# Patient Record
Sex: Female | Born: 1955
Health system: Southern US, Community
[De-identification: ages and names within clinical notes are randomized; demographics above are authoritative.]

## PROBLEM LIST (undated history)

## (undated) DIAGNOSIS — F32A Depression, unspecified: Secondary | ICD-10-CM

## (undated) DIAGNOSIS — E119 Type 2 diabetes mellitus without complications: Secondary | ICD-10-CM

## (undated) DIAGNOSIS — B019 Varicella without complication: Secondary | ICD-10-CM

## (undated) DIAGNOSIS — K219 Gastro-esophageal reflux disease without esophagitis: Secondary | ICD-10-CM

## (undated) DIAGNOSIS — F329 Major depressive disorder, single episode, unspecified: Secondary | ICD-10-CM

## (undated) DIAGNOSIS — H269 Unspecified cataract: Secondary | ICD-10-CM

## (undated) DIAGNOSIS — E785 Hyperlipidemia, unspecified: Secondary | ICD-10-CM

## (undated) DIAGNOSIS — R55 Syncope and collapse: Secondary | ICD-10-CM

## (undated) DIAGNOSIS — R000 Tachycardia, unspecified: Secondary | ICD-10-CM

## (undated) DIAGNOSIS — I2510 Atherosclerotic heart disease of native coronary artery without angina pectoris: Secondary | ICD-10-CM

## (undated) DIAGNOSIS — D649 Anemia, unspecified: Secondary | ICD-10-CM

## (undated) DIAGNOSIS — T7840XA Allergy, unspecified, initial encounter: Secondary | ICD-10-CM

## (undated) HISTORY — DX: Varicella without complication: B01.9

## (undated) HISTORY — PX: TONSILLECTOMY: SUR1361

## (undated) HISTORY — DX: Depression, unspecified: F32.A

## (undated) HISTORY — DX: Gastro-esophageal reflux disease without esophagitis: K21.9

## (undated) HISTORY — DX: Anemia, unspecified: D64.9

## (undated) HISTORY — PX: ANAL FISSURE REPAIR: SHX2312

## (undated) HISTORY — DX: Syncope and collapse: R55

## (undated) HISTORY — PX: EYE SURGERY: SHX253

## (undated) HISTORY — DX: Atherosclerotic heart disease of native coronary artery without angina pectoris: I25.10

## (undated) HISTORY — DX: Unspecified cataract: H26.9

## (undated) HISTORY — PX: TUBAL LIGATION: SHX77

## (undated) HISTORY — DX: Allergy, unspecified, initial encounter: T78.40XA

## (undated) HISTORY — DX: Tachycardia, unspecified: R00.0

## (undated) HISTORY — DX: Type 2 diabetes mellitus without complications: E11.9

## (undated) HISTORY — DX: Hyperlipidemia, unspecified: E78.5

---
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
## 2001-02-18 ENCOUNTER — Encounter: Admission: RE | Admit: 2001-02-18 | Discharge: 2001-02-18 | Payer: Self-pay | Admitting: Obstetrics and Gynecology

## 2001-02-18 ENCOUNTER — Encounter: Payer: Self-pay | Admitting: Obstetrics and Gynecology

## 2019-09-03 ENCOUNTER — Encounter: Payer: Self-pay | Admitting: Primary Care

## 2019-09-03 ENCOUNTER — Other Ambulatory Visit: Payer: Self-pay

## 2019-09-03 ENCOUNTER — Ambulatory Visit (INDEPENDENT_AMBULATORY_CARE_PROVIDER_SITE_OTHER): Payer: PPO | Admitting: Primary Care

## 2019-09-03 ENCOUNTER — Other Ambulatory Visit: Payer: Self-pay | Admitting: Primary Care

## 2019-09-03 VITALS — BP 124/82 | HR 103 | Temp 97.3°F | Ht 67.25 in | Wt 199.8 lb

## 2019-09-03 DIAGNOSIS — Z1211 Encounter for screening for malignant neoplasm of colon: Secondary | ICD-10-CM

## 2019-09-03 DIAGNOSIS — E119 Type 2 diabetes mellitus without complications: Secondary | ICD-10-CM | POA: Insufficient documentation

## 2019-09-03 DIAGNOSIS — R202 Paresthesia of skin: Secondary | ICD-10-CM | POA: Insufficient documentation

## 2019-09-03 DIAGNOSIS — Z23 Encounter for immunization: Secondary | ICD-10-CM

## 2019-09-03 DIAGNOSIS — Z1231 Encounter for screening mammogram for malignant neoplasm of breast: Secondary | ICD-10-CM

## 2019-09-03 DIAGNOSIS — E2839 Other primary ovarian failure: Secondary | ICD-10-CM | POA: Diagnosis not present

## 2019-09-03 DIAGNOSIS — D649 Anemia, unspecified: Secondary | ICD-10-CM | POA: Diagnosis not present

## 2019-09-03 DIAGNOSIS — E785 Hyperlipidemia, unspecified: Secondary | ICD-10-CM | POA: Insufficient documentation

## 2019-09-03 DIAGNOSIS — E1140 Type 2 diabetes mellitus with diabetic neuropathy, unspecified: Secondary | ICD-10-CM

## 2019-09-03 DIAGNOSIS — Z0000 Encounter for general adult medical examination without abnormal findings: Secondary | ICD-10-CM | POA: Insufficient documentation

## 2019-09-03 LAB — CBC
HCT: 42.4 % (ref 36.0–46.0)
Hemoglobin: 14.5 g/dL (ref 12.0–15.0)
MCHC: 34.3 g/dL (ref 30.0–36.0)
MCV: 89.2 fl (ref 78.0–100.0)
Platelets: 321 10*3/uL (ref 150.0–400.0)
RBC: 4.76 Mil/uL (ref 3.87–5.11)
RDW: 12.8 % (ref 11.5–15.5)
WBC: 9.5 10*3/uL (ref 4.0–10.5)

## 2019-09-03 LAB — LIPID PANEL
Cholesterol: 209 mg/dL — ABNORMAL HIGH (ref 0–200)
HDL: 35.9 mg/dL — ABNORMAL LOW (ref >39.00)
NonHDL: 172.8
Total CHOL/HDL Ratio: 6
Triglycerides: 292 mg/dL — ABNORMAL HIGH (ref 0.0–149.0)
VLDL: 58.4 mg/dL — ABNORMAL HIGH (ref 0.0–40.0)

## 2019-09-03 LAB — COMPREHENSIVE METABOLIC PANEL
ALT: 9 U/L (ref 0–35)
AST: 9 U/L (ref 0–37)
Albumin: 3.9 g/dL (ref 3.5–5.2)
Alkaline Phosphatase: 90 U/L (ref 39–117)
BUN: 12 mg/dL (ref 6–23)
CO2: 30 mEq/L (ref 19–32)
Calcium: 9.7 mg/dL (ref 8.4–10.5)
Chloride: 96 mEq/L (ref 96–112)
Creatinine, Ser: 0.45 mg/dL (ref 0.40–1.20)
GFR: 139.84 mL/min (ref >60.00)
Glucose, Bld: 396 mg/dL — ABNORMAL HIGH (ref 70–99)
Potassium: 4.1 mEq/L (ref 3.5–5.1)
Sodium: 134 mEq/L — ABNORMAL LOW (ref 135–145)
Total Bilirubin: 0.4 mg/dL (ref 0.2–1.2)
Total Protein: 6.7 g/dL (ref 6.0–8.3)

## 2019-09-03 LAB — IBC + FERRITIN
Ferritin: 56.1 ng/mL (ref 10.0–291.0)
Iron: 93 ug/dL (ref 42–145)
Saturation Ratios: 29.3 % (ref 20.0–50.0)
Transferrin: 227 mg/dL (ref 212.0–360.0)

## 2019-09-03 LAB — VITAMIN B12: Vitamin B-12: 447 pg/mL (ref 211–911)

## 2019-09-03 LAB — TSH: TSH: 2.99 u[IU]/mL (ref 0.35–4.50)

## 2019-09-03 LAB — HEMOGLOBIN A1C: Hgb A1c MFr Bld: 13.3 % — ABNORMAL HIGH (ref 4.6–6.5)

## 2019-09-03 LAB — LDL CHOLESTEROL, DIRECT: Direct LDL: 137 mg/dL

## 2019-09-03 MED ORDER — BLOOD GLUCOSE MONITOR KIT: PACK | 0 refills | Status: DC

## 2019-09-03 MED ORDER — ATORVASTATIN CALCIUM 40 MG PO TABS: 40.0000 mg | ORAL_TABLET | Freq: Every evening | ORAL | 3 refills | Status: DC

## 2019-09-03 MED ORDER — ZOSTER VAC RECOMB ADJUVANTED 50 MCG/0.5ML IM SUSR: 0.5000 mL | Freq: Once | INTRAMUSCULAR | 1 refills | Status: AC

## 2019-09-03 MED ORDER — PEN NEEDLES 31G X 6 MM MISC: 2 refills | Status: DC

## 2019-09-03 MED ORDER — LANTUS SOLOSTAR 100 UNIT/ML ~~LOC~~ SOPN: 10.0000 [IU] | PEN_INJECTOR | Freq: Every evening | SUBCUTANEOUS | 2 refills | Status: DC

## 2019-09-03 MED ORDER — METFORMIN HCL 1000 MG PO TABS: 1000.0000 mg | ORAL_TABLET | Freq: Two times a day (BID) | ORAL | 3 refills | Status: DC

## 2019-09-03 MED ORDER — PNEUMOCOCCAL 13-VAL CONJ VACC IM SUSP: 0.5000 mL | Freq: Once | INTRAMUSCULAR | 0 refills | Status: AC

## 2019-09-03 NOTE — Patient Instructions (Addendum)
Call the breast center to schedule your mammogram and bone density scan.  You will be contacted regarding your referral to GI for the colonoscopy.  Please let us know if you have not been contacted within two weeks.   Stop by the lab prior to leaving today. I will notify you of your results once received.   Take the Shingles and pneumonia vaccinations to your pharmacy. Wait a few weeks in between Shingrix and Pneumonia vaccines. The second Shingrix is completed 2-6 months after the initial dose.  It was a pleasure to meet you today! Please don't hesitate to call or message me with any questions. Welcome to [facility]!

## 2019-09-03 NOTE — Assessment & Plan Note (Signed)
Overdue for health maintenance.  Rx's provided for Shingrix and Prevnar.  Order placed for mammogram, bone density. Referral placed to GI for colonoscopy.

## 2019-09-03 NOTE — Assessment & Plan Note (Signed)
Chronic since childhood. Repeat CBC, IBC panel, and B12 pending.

## 2019-09-03 NOTE — Assessment & Plan Note (Signed)
A1C today of 13.3, history of gestational diabetes, no diagnosis since.  I spoke with patient via phone and notified her of this diagnosis.   Treat with Lantus 10 units HS and Metformin 1000 mg BID. Discussed to start with Metformin once daily x 1-2 weeks due to potential GI side effects.  We will initiate statin therapy. Check urine microalbumin next visit. May need ACE/ARB.  We discussed the importance and directions for checking glucose readings. We discussed her goal glucose reading should be 120-130, below 150. Rx for glucometer sent to pharmacy.   She had no questions, we will plan to see her back in 4-6 weeks with glucose logs. She will call to schedule an appointment. I encouraged her to sign up on My Chart so we could communicate easier.

## 2019-09-03 NOTE — Progress Notes (Signed)
Subjective:    Patient ID: Alyssa Stone, female    DOB: March 21, 1956, 64 y.o.   MRN: [id number]  HPI  This visit occurred during the SARS-CoV-2 public health emergency.  Safety protocols were in place, including screening questions prior to the visit, additional usage of staff PPE, and extensive cleaning of exam room while observing appropriate contact time as indicated for disinfecting solutions.   Alyssa Stone is a 64 year old female who presents today to establish care and discuss the problems mentioned below. Will obtain/review records. She is also needing her mammogram, colonoscopy, Shingrix vaccines and Pneumonia vaccine.  1) Foot Discomfort: Chronic for the last year. Symptoms include compression with intermittent burning sensation with numbness to the dorsal and plantar foot from the arch to toes. Symptoms are bilateral but more prominent on the right.   Symptoms only occur during the night. She denies injury/trauma, daytime symptoms, color changes to the skin, personal history of diabetes, discomfort when walking. History of Anemia and was once on B12 supplementation.   BP Readings from Last 3 Encounters:  09/03/19 124/82   2) Anemia: Chronic since birth. She was managed on oral iron in the past, no recent use due to GI upset. She was also taking B12 at the time, no recent use. She's never had to undergo iron infusions or blood transfusions.   Review of Systems  Constitutional: Negative for fatigue.  Eyes: Negative for visual disturbance.  Respiratory: Negative for shortness of breath.   Cardiovascular: Negative for chest pain.  Gastrointestinal: Negative for blood in stool.  Genitourinary: Negative for vaginal bleeding.  Musculoskeletal:       See HPI  Skin: Negative for color change.  Allergic/Immunologic: Negative for environmental allergies.  Neurological: Positive for numbness.       See HPI  Hematological: Does not bruise/bleed easily.       Past Medical  History:  Diagnosis Date  . Allergy   . Chicken pox   . Depression   . Fainting episodes   . GERD (gastroesophageal reflux disease)      Social History   Socioeconomic History  . Marital status: Divorced    Spouse name: Not on file  . Number of children: 3  . Years of education: Not on file  . Highest education level: 12th grade  Occupational History  . Not on file  Tobacco Use  . Smoking status: Unknown If Ever Smoked  . Smokeless tobacco: Never Used  Substance and Sexual Activity  . Alcohol use: Not Currently  . Drug use: Never  . Sexual activity: Not Currently  Other Topics Concern  . Not on file  Social History Narrative  . Not on file   Social Determinants of Health   Financial Resource Strain:   . Difficulty of Paying Living Expenses: Not on file  Food Insecurity:   . Worried About Charity fundraiser in the Last Year: Not on file  . Ran Out of Food in the Last Year: Not on file  Transportation Needs:   . Lack of Transportation (Medical): Not on file  . Lack of Transportation (Non-Medical): Not on file  Physical Activity:   . Days of Exercise per Week: Not on file  . Minutes of Exercise per Session: Not on file  Stress:   . Feeling of Stress : Not on file  Social Connections:   . Frequency of Communication with Friends and Family: Not on file  . Frequency of Social Gatherings with Friends and  Family: Not on file  . Attends Religious Services: Not on file  . Active Member of Clubs or Organizations: Not on file  . Attends Archivist Meetings: Not on file  . Marital Status: Not on file  Intimate Partner Violence:   . Fear of Current or Ex-Partner: Not on file  . Emotionally Abused: Not on file  . Physically Abused: Not on file  . Sexually Abused: Not on file    Family History  Problem Relation Age of Onset  . Cancer Mother   . Diabetes Mother   . Miscarriages / [place] Mother   . Hypertension Father   . Heart attack Father   . Cancer  Sister   . Diabetes Sister   . Diabetes Brother   . Heart attack Brother   . Kidney disease Brother   . Hyperlipidemia Brother     Not on File  No current outpatient medications on file prior to visit.   No current facility-administered medications on file prior to visit.    BP 124/82   Pulse (!) 103   Temp (!) 97.3 F (36.3 C)   Ht 5' 7.25" (1.708 m)   Wt 199 lb 12.8 oz (90.6 kg)   SpO2 96%   BMI 31.06 kg/m    Objective:   Physical Exam  Constitutional: She is oriented to person, place, and time. She appears well-nourished.  Cardiovascular: Normal rate and regular rhythm.  Pulses:      Dorsalis pedis pulses are 2+ on the right side and 2+ on the left side.       Posterior tibial pulses are 2+ on the right side and 2+ on the left side.  Bilateral toes seem cooler  Respiratory: Effort normal and breath sounds normal.  Musculoskeletal:        General: Normal range of motion.     Cervical back: Neck supple.  Neurological: She is alert and oriented to person, place, and time.  Skin: Skin is warm and dry. No erythema.  Psychiatric: She has a normal mood and affect.           Assessment & Plan:

## 2019-09-03 NOTE — Assessment & Plan Note (Signed)
LDL above goal, especially in the setting of diabetes. She agreed. Rx for atorvastatin sent to pharmacy. Repeat lipids and LFT's in 4-6 weeks.

## 2019-09-03 NOTE — Assessment & Plan Note (Signed)
Plantar, bilateral, only occurring at night during rest.   Exam today overall benign. No swelling, erythema, deformity. Good circulation.  Check CBC, B12, CMP, A1C, TSH today.

## 2019-09-22 ENCOUNTER — Other Ambulatory Visit: Payer: Self-pay | Admitting: Primary Care

## 2019-10-08 ENCOUNTER — Ambulatory Visit: Payer: PPO | Admitting: Primary Care

## 2019-10-13 ENCOUNTER — Encounter: Payer: Self-pay | Admitting: Gastroenterology

## 2019-10-15 ENCOUNTER — Other Ambulatory Visit: Payer: Self-pay

## 2019-10-15 ENCOUNTER — Encounter: Payer: Self-pay | Admitting: Primary Care

## 2019-10-15 ENCOUNTER — Ambulatory Visit (INDEPENDENT_AMBULATORY_CARE_PROVIDER_SITE_OTHER): Payer: PPO | Admitting: Primary Care

## 2019-10-15 VITALS — BP 122/82 | HR 108 | Temp 97.2°F | Ht 67.25 in | Wt 193.8 lb

## 2019-10-15 DIAGNOSIS — E1140 Type 2 diabetes mellitus with diabetic neuropathy, unspecified: Secondary | ICD-10-CM | POA: Diagnosis not present

## 2019-10-15 DIAGNOSIS — E785 Hyperlipidemia, unspecified: Secondary | ICD-10-CM

## 2019-10-15 DIAGNOSIS — R202 Paresthesia of skin: Secondary | ICD-10-CM | POA: Diagnosis not present

## 2019-10-15 LAB — HEPATIC FUNCTION PANEL
ALT: 9 U/L (ref 0–35)
AST: 10 U/L (ref 0–37)
Albumin: 4.1 g/dL (ref 3.5–5.2)
Alkaline Phosphatase: 76 U/L (ref 39–117)
Bilirubin, Direct: 0.1 mg/dL (ref 0.0–0.3)
Total Bilirubin: 0.5 mg/dL (ref 0.2–1.2)
Total Protein: 7.5 g/dL (ref 6.0–8.3)

## 2019-10-15 LAB — LIPID PANEL
Cholesterol: 120 mg/dL (ref 0–200)
HDL: 32 mg/dL — ABNORMAL LOW (ref >39.00)
LDL Cholesterol: 56 mg/dL (ref 0–99)
NonHDL: 87.69
Total CHOL/HDL Ratio: 4
Triglycerides: 159 mg/dL — ABNORMAL HIGH (ref 0.0–149.0)
VLDL: 31.8 mg/dL (ref 0.0–40.0)

## 2019-10-15 LAB — MICROALBUMIN / CREATININE URINE RATIO
Creatinine,U: 110.3 mg/dL
Microalb Creat Ratio: 5.5 mg/g (ref 0.0–30.0)
Microalb, Ur: 6.1 mg/dL — ABNORMAL HIGH (ref 0.0–1.9)

## 2019-10-15 MED ORDER — METFORMIN HCL ER 500 MG PO TB24: 1000.0000 mg | ORAL_TABLET | Freq: Two times a day (BID) | ORAL | 3 refills | Status: DC

## 2019-10-15 MED ORDER — LANTUS SOLOSTAR 100 UNIT/ML ~~LOC~~ SOPN: 15.0000 [IU] | PEN_INJECTOR | Freq: Every evening | SUBCUTANEOUS | 2 refills | Status: DC

## 2019-10-15 NOTE — Patient Instructions (Signed)
We've increased your Lantus insulin to 15 units. Increase further to 20 units in two weeks if blood sugars run consistently at or above 200.  Stop by the lab prior to leaving today. I will notify you of your results once received.   Schedule the eye appointment as discussed.  Please schedule a follow up appointment in 3 months for diabetes check. Contact me sooner with any questions/concerns.  It was a pleasure to see you today!   Diabetes Mellitus and Nutrition, Adult When you have diabetes (diabetes mellitus), it is very important to have healthy eating habits because your blood sugar (glucose) levels are greatly affected by what you eat and drink. Eating healthy foods in the appropriate amounts, at about the same times every day, can help you:  Control your blood glucose.  Lower your risk of heart disease.  Improve your blood pressure.  Reach or maintain a healthy weight. Every person with diabetes is different, and each person has different needs for a meal plan. Your health care provider may recommend that you work with a diet and nutrition specialist (dietitian) to make a meal plan that is best for you. Your meal plan may vary depending on factors such as:  The calories you need.  The medicines you take.  Your weight.  Your blood glucose, blood pressure, and cholesterol levels.  Your activity level.  Other health conditions you have, such as heart or kidney disease. How do carbohydrates affect me? Carbohydrates, also called carbs, affect your blood glucose level more than any other type of food. Eating carbs naturally raises the amount of glucose in your blood. Carb counting is a method for keeping track of how many carbs you eat. Counting carbs is important to keep your blood glucose at a healthy level, especially if you use insulin or take certain oral diabetes medicines. It is important to know how many carbs you can safely have in each meal. This is different for every  person. Your dietitian can help you calculate how many carbs you should have at each meal and for each snack. Foods that contain carbs include:  Bread, cereal, rice, pasta, and crackers.  Potatoes and corn.  Peas, beans, and lentils.  Milk and yogurt.  Fruit and juice.  Desserts, such as cakes, cookies, ice cream, and candy. How does alcohol affect me? Alcohol can cause a sudden decrease in blood glucose (hypoglycemia), especially if you use insulin or take certain oral diabetes medicines. Hypoglycemia can be a life-threatening condition. Symptoms of hypoglycemia (sleepiness, dizziness, and confusion) are similar to symptoms of having too much alcohol. If your health care provider says that alcohol is safe for you, follow these guidelines:  Limit alcohol intake to no more than 1 drink per day for nonpregnant women and 2 drinks per day for men. One drink equals 12 oz of beer, 5 oz of wine, or 1 oz of hard liquor.  Do not drink on an empty stomach.  Keep yourself hydrated with water, diet soda, or unsweetened iced tea.  Keep in mind that regular soda, juice, and other mixers may contain a lot of sugar and must be counted as carbs. What are tips for following this plan?  Reading food labels  Start by checking the serving size on the "Nutrition Facts" label of packaged foods and drinks. The amount of calories, carbs, fats, and other nutrients listed on the label is based on one serving of the item. Many items contain more than one serving per package.  Check the total grams (g) of carbs in one serving. You can calculate the number of servings of carbs in one serving by dividing the total carbs by 15. For example, if a food has 30 g of total carbs, it would be equal to 2 servings of carbs.  Check the number of grams (g) of saturated and trans fats in one serving. Choose foods that have low or no amount of these fats.  Check the number of milligrams (mg) of salt (sodium) in one serving.  Most people should limit total sodium intake to less than 2,300 mg per day.  Always check the nutrition information of foods labeled as "low-fat" or "nonfat". These foods may be higher in added sugar or refined carbs and should be avoided.  Talk to your dietitian to identify your daily goals for nutrients listed on the label. Shopping  Avoid buying canned, premade, or processed foods. These foods tend to be high in fat, sodium, and added sugar.  Shop around the outside edge of the grocery store. This includes fresh fruits and vegetables, bulk grains, fresh meats, and fresh dairy. Cooking  Use low-heat cooking methods, such as baking, instead of high-heat cooking methods like deep frying.  Cook using healthy oils, such as olive, canola, or sunflower oil.  Avoid cooking with butter, cream, or high-fat meats. Meal planning  Eat meals and snacks regularly, preferably at the same times every day. Avoid going long periods of time without eating.  Eat foods high in fiber, such as fresh fruits, vegetables, beans, and whole grains. Talk to your dietitian about how many servings of carbs you can eat at each meal.  Eat 4-6 ounces (oz) of lean protein each day, such as lean meat, chicken, fish, eggs, or tofu. One oz of lean protein is equal to: ? 1 oz of meat, chicken, or fish. ? 1 egg. ?  cup of tofu.  Eat some foods each day that contain healthy fats, such as avocado, nuts, seeds, and fish. Lifestyle  Check your blood glucose regularly.  Exercise regularly as told by your health care provider. This may include: ? 150 minutes of moderate-intensity or vigorous-intensity exercise each week. This could be brisk walking, biking, or water aerobics. ? Stretching and doing strength exercises, such as yoga or weightlifting, at least 2 times a week.  Take medicines as told by your health care provider.  Do not use any products that contain nicotine or tobacco, such as cigarettes and e-cigarettes.  If you need help quitting, ask your health care provider.  Work with a Social worker or diabetes educator to identify strategies to manage stress and any emotional and social challenges. Questions to ask a health care provider  Do I need to meet with a diabetes educator?  Do I need to meet with a dietitian?  What number can I call if I have questions?  When are the best times to check my blood glucose? Where to find more information:  American Diabetes Association: diabetes.org  Academy of Nutrition and Dietetics: www.eatright.[employer] of Diabetes and Digestive and Kidney Diseases (NIH): [employer] Summary  A healthy meal plan will help you control your blood glucose and maintain a healthy lifestyle.  Working with a diet and nutrition specialist (dietitian) can help you make a meal plan that is best for you.  Keep in mind that carbohydrates (carbs) and alcohol have immediate effects on your blood glucose levels. It is important to count carbs and to use alcohol carefully. This  information is not intended to replace advice given to you by your health care provider. Make sure you discuss any questions you have with your health care provider. Document Revised: 07/19/2017 Document Reviewed: 09/10/2016 Elsevier Patient Education  2020 [employer].

## 2019-10-15 NOTE — Assessment & Plan Note (Signed)
Improving with diabetes treatment. Continue to monitor. Foot exam today.

## 2019-10-15 NOTE — Progress Notes (Signed)
 Subjective:    Patient ID: Alyssa Stone, female    DOB: 04/30/1956, 64 y.o.   MRN: [id number]  HPI  This visit occurred during the SARS-CoV-2 public health emergency.  Safety protocols were in place, including screening questions prior to the visit, additional usage of staff PPE, and extensive cleaning of exam room while observing appropriate contact time as indicated for disinfecting solutions.   Alyssa Stone is a 64 year old female who presents today for follow up.  1) Hyperlipidemia: Currently managed on atorvastatin 40 mg which was initiated during her last visit about six weeks ago. She denies myalgias. Due for repeat labs today.  2) Type 2 Diabetes: New diagnosis as of last month.  Current medications include: Lantus 10 units, Metformin 1000 mg BID. She has nausea each time she takes her metformin. Denies diarrhea.  She is checking her blood glucose 3 times daily and is getting readings of:  AM fasting: low to mid 200's Before lunch: mid 200's-low 300's Before dinner: high 100's to low 300's  Last A1C: 13.3 in January 2021 Last Eye Exam: She will schedule.  Last Foot Exam: Due today Pneumonia Vaccination: Completed  ACE/ARB: Urine microalbumin due today Statin: atorvastatin   BP Readings from Last 3 Encounters:  10/15/19 122/82  09/03/19 124/82     Review of Systems  Respiratory: Negative for shortness of breath.   Cardiovascular: Negative for chest pain.  Gastrointestinal: Positive for nausea. Negative for diarrhea.  Neurological: Negative for dizziness and headaches.       Past Medical History:  Diagnosis Date  . Allergy   . Anemia   . Chicken pox   . Depression   . Fainting episodes   . GERD (gastroesophageal reflux disease)      Social History   Socioeconomic History  . Marital status: Divorced    Spouse name: Not on file  . Number of children: 3  . Years of education: Not on file  . Highest education level: 12th grade  Occupational  History  . Not on file  Tobacco Use  . Smoking status: Unknown If Ever Smoked  . Smokeless tobacco: Never Used  Substance and Sexual Activity  . Alcohol use: Not Currently  . Drug use: Never  . Sexual activity: Not Currently  Other Topics Concern  . Not on file  Social History Narrative  . Not on file   Social Determinants of Health   Financial Resource Strain:   . Difficulty of Paying Living Expenses: Not on file  Food Insecurity:   . Worried About Running Out of Food in the Last Year: Not on file  . Ran Out of Food in the Last Year: Not on file  Transportation Needs:   . Lack of Transportation (Medical): Not on file  . Lack of Transportation (Non-Medical): Not on file  Physical Activity:   . Days of Exercise per Week: Not on file  . Minutes of Exercise per Session: Not on file  Stress:   . Feeling of Stress : Not on file  Social Connections:   . Frequency of Communication with Friends and Family: Not on file  . Frequency of Social Gatherings with Friends and Family: Not on file  . Attends Religious Services: Not on file  . Active Member of Clubs or Organizations: Not on file  . Attends Club or Organization Meetings: Not on file  . Marital Status: Not on file  Intimate Partner Violence:   . Fear of Current or Ex-Partner: Not   on file  . Emotionally Abused: Not on file  . Physically Abused: Not on file  . Sexually Abused: Not on file    Past Surgical History:  Procedure Laterality Date  . CESAREAN SECTION    . TONSILLECTOMY Bilateral     Family History  Problem Relation Age of Onset  . Cancer Mother   . Diabetes Mother   . Miscarriages / [place] Mother   . Hypertension Father   . Heart attack Father   . Cancer Sister   . Diabetes Sister   . Diabetes Brother   . Heart attack Brother   . Kidney disease Brother   . Hyperlipidemia Brother     No Known Allergies  Current Outpatient Medications on File Prior to Visit  Medication Sig Dispense Refill  .  atorvastatin (LIPITOR) 40 MG tablet Take 1 tablet (40 mg total) by mouth every evening. For cholesterol. 90 tablet 3  . blood glucose meter kit and supplies KIT Dispense based on patient and insurance preference. Use up to four times daily as directed. (FOR ICD-9 250.00, 250.01). 1 each 0  . Insulin Glargine (LANTUS SOLOSTAR) 100 UNIT/ML Solostar Pen Inject 10 Units into the skin every evening. For diabetes 5 pen 2  . Insulin Pen Needle (PEN NEEDLES) 31G X 6 MM MISC Use nightly with insulin pen for diabetes. 100 each 2  . metFORMIN (GLUCOPHAGE) 1000 MG tablet Take 1 tablet (1,000 mg total) by mouth 2 (two) times daily with a meal. For diabetes. 180 tablet 3  . OneTouch Delica Lancets 56L MISC USE TO TEST BLOOD SUGAR UP TO 4 TIMES A DAY 100 each 5  . ONETOUCH VERIO test strip USE TO TEST BLOOD SUGAR UP TO 4 TIMES DAILY 100 strip 5   No current facility-administered medications on file prior to visit.    BP 122/82   Pulse (!) 108   Temp (!) 97.2 F (36.2 C) (Temporal)   Ht 5' 7.25" (1.708 m)   Wt 193 lb 12 oz (87.9 kg)   SpO2 98%   BMI 30.12 kg/m    Objective:   Physical Exam  Constitutional: She appears well-nourished.  Cardiovascular: Normal rate and regular rhythm.  Respiratory: Effort normal and breath sounds normal.  Musculoskeletal:     Cervical back: Neck supple.  Skin: Skin is warm and dry.  Psychiatric: She has a normal mood and affect.           Assessment & Plan:

## 2019-10-15 NOTE — Assessment & Plan Note (Signed)
Glucose readings above goal on Lantus 10 units, Metformin causing nausea.   Change Metformin to XR version. Increase Lantus to 15 units for two weeks, then 20 units for glucose readings consistently at or above 200.  Foot exam today. Pneumonia vaccination UTD. Managed on statin. Urine microalbumin pending.  Follow up in 3-4 months.

## 2019-10-15 NOTE — Assessment & Plan Note (Signed)
Compliant to atorvastatin, repeat lipids and LFT's pending.

## 2019-11-12 ENCOUNTER — Other Ambulatory Visit: Payer: Self-pay

## 2019-11-12 ENCOUNTER — Ambulatory Visit (AMBULATORY_SURGERY_CENTER): Payer: Self-pay | Admitting: *Deleted

## 2019-11-12 VITALS — Temp 96.6°F | Ht 67.25 in | Wt 199.0 lb

## 2019-11-12 DIAGNOSIS — Z1211 Encounter for screening for malignant neoplasm of colon: Secondary | ICD-10-CM

## 2019-11-12 DIAGNOSIS — Z01818 Encounter for other preprocedural examination: Secondary | ICD-10-CM

## 2019-11-12 NOTE — Progress Notes (Signed)

## 2019-11-20 ENCOUNTER — Other Ambulatory Visit: Payer: Self-pay

## 2019-11-20 ENCOUNTER — Ambulatory Visit
Admission: RE | Admit: 2019-11-20 | Discharge: 2019-11-20 | Disposition: A | Discharge status: Home / Self Care | Payer: PPO | Source: Ambulatory Visit | Attending: Primary Care | Admitting: Primary Care

## 2019-11-20 DIAGNOSIS — Z1231 Encounter for screening mammogram for malignant neoplasm of breast: Secondary | ICD-10-CM

## 2019-11-20 DIAGNOSIS — Z780 Asymptomatic menopausal state: Secondary | ICD-10-CM | POA: Diagnosis not present

## 2019-11-20 DIAGNOSIS — E2839 Other primary ovarian failure: Secondary | ICD-10-CM

## 2019-11-23 ENCOUNTER — Other Ambulatory Visit: Payer: Self-pay | Admitting: Primary Care

## 2019-11-23 DIAGNOSIS — R928 Other abnormal and inconclusive findings on diagnostic imaging of breast: Secondary | ICD-10-CM

## 2019-11-24 ENCOUNTER — Ambulatory Visit (INDEPENDENT_AMBULATORY_CARE_PROVIDER_SITE_OTHER): Payer: PPO

## 2019-11-24 ENCOUNTER — Other Ambulatory Visit: Payer: Self-pay | Admitting: Gastroenterology

## 2019-11-24 DIAGNOSIS — Z1159 Encounter for screening for other viral diseases: Secondary | ICD-10-CM | POA: Diagnosis not present

## 2019-11-25 ENCOUNTER — Encounter: Payer: Self-pay | Admitting: Gastroenterology

## 2019-11-25 LAB — SARS CORONAVIRUS 2 (TAT 6-24 HRS): SARS Coronavirus 2: NEGATIVE

## 2019-11-26 ENCOUNTER — Encounter: Payer: Self-pay | Admitting: Gastroenterology

## 2019-11-26 ENCOUNTER — Other Ambulatory Visit: Payer: Self-pay

## 2019-11-26 ENCOUNTER — Ambulatory Visit (AMBULATORY_SURGERY_CENTER): Payer: PPO | Admitting: Gastroenterology

## 2019-11-26 VITALS — BP 127/70 | HR 84 | Temp 98.2°F | Resp 16 | Ht 62.0 in | Wt 199.0 lb

## 2019-11-26 DIAGNOSIS — D123 Benign neoplasm of transverse colon: Secondary | ICD-10-CM

## 2019-11-26 DIAGNOSIS — D125 Benign neoplasm of sigmoid colon: Secondary | ICD-10-CM | POA: Diagnosis not present

## 2019-11-26 DIAGNOSIS — D122 Benign neoplasm of ascending colon: Secondary | ICD-10-CM | POA: Diagnosis not present

## 2019-11-26 DIAGNOSIS — Z1211 Encounter for screening for malignant neoplasm of colon: Secondary | ICD-10-CM

## 2019-11-26 DIAGNOSIS — D124 Benign neoplasm of descending colon: Secondary | ICD-10-CM

## 2019-11-26 MED ORDER — SODIUM CHLORIDE 0.9 % IV SOLN: 500.0000 mL | INTRAVENOUS | Status: DC

## 2019-11-26 NOTE — Patient Instructions (Signed)
YOU HAD AN ENDOSCOPIC PROCEDURE TODAY AT [facility]:   Refer to the procedure report that was given to you for any specific questions about what was found during the examination.  If the procedure report does not answer your questions, please call your gastroenterologist to clarify.  If you requested that your care partner not be given the details of your procedure findings, then the procedure report has been included in a sealed envelope for you to review at your convenience later.  YOU SHOULD EXPECT: Some feelings of bloating in the abdomen. Passage of more gas than usual.  Walking can help get rid of the air that was put into your GI tract during the procedure and reduce the bloating. If you had a lower endoscopy (such as a colonoscopy or flexible sigmoidoscopy) you may notice spotting of blood in your stool or on the toilet paper. If you underwent a bowel prep for your procedure, you may not have a normal bowel movement for a few days.  Please Note:  You might notice some irritation and congestion in your nose or some drainage.  This is from the oxygen used during your procedure.  There is no need for concern and it should clear up in a day or so.  SYMPTOMS TO REPORT IMMEDIATELY:   Following lower endoscopy (colonoscopy or flexible sigmoidoscopy):  Excessive amounts of blood in the stool  Significant tenderness or worsening of abdominal pains  Swelling of the abdomen that is new, acute  Fever of 100F or higher   For urgent or emergent issues, a gastroenterologist can be reached at any hour by calling [phone]. Do not use MyChart messaging for urgent concerns.    DIET:  We do recommend a small meal at first, but then you may proceed to your regular diet.  Drink plenty of fluids but you should avoid alcoholic beverages for 24 hours. Follow a High Fiber Diet.  MEDICATIONS: Continue present medications. Use FiberCon 1 tablet by mouth daily.  Please see handouts given  to you by your recovery nurse.  ACTIVITY:  You should plan to take it easy for the rest of today and you should NOT DRIVE or use heavy machinery until tomorrow (because of the sedation medicines used during the test).    FOLLOW UP: Our staff will call the number listed on your records 48-72 hours following your procedure to check on you and address any questions or concerns that you may have regarding the information given to you following your procedure. If we do not reach you, we will leave a message.  We will attempt to reach you two times.  During this call, we will ask if you have developed any symptoms of COVID 19. If you develop any symptoms (ie: fever, flu-like symptoms, shortness of breath, cough etc.) before then, please call [phone].  If you test positive for Covid 19 in the 2 weeks post procedure, please call and report this information to [place].    If any biopsies were taken you will be contacted by phone or by letter within the next 1-3 weeks.  Please call us at [phone] if you have not heard about the biopsies in 3 weeks.   Thank you for allowing [place] to provide for your healthcare needs today.   SIGNATURES/CONFIDENTIALITY: You and/or your care partner have signed paperwork which will be entered into your electronic medical record.  These signatures attest to the fact that that the information above on your After Visit  Summary has been reviewed and is understood.  Full responsibility of the confidentiality of this discharge information lies with you and/or your care-partner. 

## 2019-11-26 NOTE — Progress Notes (Signed)
pt tolerated well. VSS. awake and to recovery. Report given to RN.  

## 2019-11-26 NOTE — Op Note (Signed)
[place] Patient Name: Alyssa Stone Procedure Date: 11/26/2019 8:57 AM MRN: [id number] Endoscopist: Justice Britain , MD Age: 64 Referring MD:  Date of Birth: 12/26/1955 Gender: Female Account #: [id number] Procedure:                Colonoscopy Indications:              Screening for colorectal malignant neoplasm, This                            is the patient's first colonoscopy Medicines:                Monitored Anesthesia Care Procedure:                Pre-Anesthesia Assessment:                           - Prior to the procedure, a History and Physical                            was performed, and patient medications and                            allergies were reviewed. The patient's tolerance of                            previous anesthesia was also reviewed. The risks                            and benefits of the procedure and the sedation                            options and risks were discussed with the patient.                            All questions were answered, and informed consent                            was obtained. Prior Anticoagulants: The patient has                            taken no previous anticoagulant or antiplatelet                            agents. ASA Grade Assessment: II - A patient with                            mild systemic disease. After reviewing the risks                            and benefits, the patient was deemed in                            satisfactory condition to undergo the procedure.  After obtaining informed consent, the colonoscope                            was passed under direct vision. Throughout the                            procedure, the patient's blood pressure, pulse, and                            oxygen saturations were monitored continuously. The                            Colonoscope was introduced through the anus and                            advanced to the the  cecum, identified by                            appendiceal orifice and ileocecal valve. The                            colonoscopy was performed without difficulty. The                            patient tolerated the procedure. The quality of the                            bowel preparation was adequate. The ileocecal                            valve, appendiceal orifice, and rectum were                            photographed. Scope In: 9:09:18 AM Scope Out: 9:36:55 AM Scope Withdrawal Time: 0 hours 22 minutes 57 seconds  Total Procedure Duration: 0 hours 27 minutes 37 seconds  Findings:                 The digital rectal exam findings include external                            skin tags and hemorrhoids. Pertinent negatives                            include no palpable rectal lesions.                           A large amount of semi-liquid semi-solid stool was                            found in the entire colon, interfering with                            visualization. Lavage of the area was performed  using copious amounts, resulting in clearance with                            adequate visualization.                           Four sessile and semi-sessile polyps were found in                            the sigmoid colon (1), descending colon (1),                            transverse colon (1), and ascending colon (1). The                            polyps were 3 to 10 mm in size. These polyps were                            removed with a cold snare. Resection and retrieval                            were complete.                           Many small-mouthed diverticula were found in the                            recto-sigmoid colon, sigmoid colon and descending                            colon.                           Normal mucosa was found in the entire colon                            otherwise.                           Non-bleeding  non-thrombosed external and internal                            hemorrhoids were found during retroflexion, during                            perianal exam and during digital exam. The                            hemorrhoids were Grade II (internal hemorrhoids                            that prolapse but reduce spontaneously). Complications:            No immediate complications. Estimated Blood Loss:     Estimated blood loss was minimal. Impression:               -  Hemorrhoids found on digital rectal exam as well                            as external skin tags.                           - Stool in the entire examined colon. Lavaged                            copiously in order to obtain adequate preparation.                           - Four 3 to 10 mm polyps in the sigmoid colon, in                            the descending colon, in the transverse colon and                            in the ascending colon, removed with a cold snare.                            Resected and retrieved.                           - Diverticulosis in the recto-sigmoid colon, in the                            sigmoid colon and in the descending colon.                           - Normal mucosa in the entire examined colon                            otherwise.                           - Non-bleeding non-thrombosed external and internal                            hemorrhoids. Recommendation:           - The patient will be observed post-procedure,                            until all discharge criteria are met.                           - Discharge patient to home.                           - Patient has a contact number available for                            emergencies. The signs and symptoms of potential  delayed complications were discussed with the                            patient. Return to normal activities tomorrow.                            Written discharge instructions  were provided to the                            patient.                           - High fiber diet.                           - Use FiberCon 1 tablet PO daily.                           - Continue present medications.                           - Await pathology results.                           - Repeat colonoscopy in likely 3 years for                            surveillance. Recommend 1 week of Miralax 1-2 times                            daily prior to her preparation to ensure adequate                            visualization on next procedure.                           - The findings and recommendations were discussed                            with the patient. Justice Britain, MD 11/26/2019 9:43:55 AM

## 2019-11-26 NOTE — Progress Notes (Signed)
Temp JB  V/S CW  I have reviewed the patient's medical history in detail and updated the computerized patient record. 

## 2019-11-26 NOTE — Progress Notes (Signed)
Called to room to assist during endoscopic procedure.  Patient ID and intended procedure confirmed with present staff. Received instructions for my participation in the procedure from the performing physician.  

## 2019-11-30 ENCOUNTER — Telehealth: Payer: Self-pay | Admitting: *Deleted

## 2019-11-30 NOTE — Telephone Encounter (Signed)
  Follow up Call-  Call back number 11/26/2019  Post procedure Call Back phone  # [phone]  Permission to leave phone message Yes  Some recent data might be hidden     Patient questions:  Do you have a fever, pain , or abdominal swelling? No. Pain Score  0 *  Have you tolerated food without any problems? Yes.    Have you been able to return to your normal activities? Yes.    Do you have any questions about your discharge instructions: Diet   No. Medications  No. Follow up visit  No.  Do you have questions or concerns about your Care? No.  Actions: * If pain score is 4 or above: No action needed, pain <4.  1. Have you developed a fever since your procedure? no  2.   Have you had an respiratory symptoms (SOB or cough) since your procedure? no  3.   Have you tested positive for COVID 19 since your procedure no  4.   Have you had any family members/close contacts diagnosed with the COVID 19 since your procedure?  no   If yes to any of these questions please route to Joylene John, RN and Erenest Rasher, RN

## 2019-12-01 ENCOUNTER — Encounter: Payer: Self-pay | Admitting: Gastroenterology

## 2019-12-04 DIAGNOSIS — H0288A Meibomian gland dysfunction right eye, upper and lower eyelids: Secondary | ICD-10-CM | POA: Diagnosis not present

## 2019-12-04 DIAGNOSIS — H524 Presbyopia: Secondary | ICD-10-CM | POA: Diagnosis not present

## 2019-12-04 DIAGNOSIS — H04123 Dry eye syndrome of bilateral lacrimal glands: Secondary | ICD-10-CM | POA: Diagnosis not present

## 2019-12-04 DIAGNOSIS — E113293 Type 2 diabetes mellitus with mild nonproliferative diabetic retinopathy without macular edema, bilateral: Secondary | ICD-10-CM | POA: Diagnosis not present

## 2019-12-04 DIAGNOSIS — H0288B Meibomian gland dysfunction left eye, upper and lower eyelids: Secondary | ICD-10-CM | POA: Diagnosis not present

## 2019-12-04 LAB — HM DIABETES EYE EXAM

## 2019-12-07 ENCOUNTER — Other Ambulatory Visit: Payer: Self-pay

## 2019-12-07 ENCOUNTER — Ambulatory Visit
Admission: RE | Admit: 2019-12-07 | Discharge: 2019-12-07 | Disposition: A | Discharge status: Home / Self Care | Payer: PPO | Source: Ambulatory Visit | Attending: Primary Care | Admitting: Primary Care

## 2019-12-07 DIAGNOSIS — R928 Other abnormal and inconclusive findings on diagnostic imaging of breast: Secondary | ICD-10-CM

## 2019-12-07 DIAGNOSIS — N6489 Other specified disorders of breast: Secondary | ICD-10-CM | POA: Diagnosis not present

## 2019-12-09 ENCOUNTER — Encounter: Payer: Self-pay | Admitting: Primary Care

## 2020-02-04 ENCOUNTER — Other Ambulatory Visit: Payer: Self-pay

## 2020-02-04 ENCOUNTER — Encounter: Payer: Self-pay | Admitting: Primary Care

## 2020-02-04 ENCOUNTER — Ambulatory Visit (INDEPENDENT_AMBULATORY_CARE_PROVIDER_SITE_OTHER): Payer: PPO | Admitting: Primary Care

## 2020-02-04 VITALS — BP 110/72 | HR 108 | Temp 96.2°F | Ht 67.25 in | Wt 194.0 lb

## 2020-02-04 DIAGNOSIS — E785 Hyperlipidemia, unspecified: Secondary | ICD-10-CM | POA: Diagnosis not present

## 2020-02-04 DIAGNOSIS — E1140 Type 2 diabetes mellitus with diabetic neuropathy, unspecified: Secondary | ICD-10-CM | POA: Diagnosis not present

## 2020-02-04 LAB — POCT GLYCOSYLATED HEMOGLOBIN (HGB A1C): Hemoglobin A1C: 8 % — AB (ref 4.0–5.6)

## 2020-02-04 MED ORDER — METFORMIN HCL ER 500 MG PO TB24: 500.0000 mg | ORAL_TABLET | Freq: Two times a day (BID) | ORAL | 3 refills | Status: DC

## 2020-02-04 MED ORDER — LANTUS SOLOSTAR 100 UNIT/ML ~~LOC~~ SOPN: 25.0000 [IU] | PEN_INJECTOR | Freq: Every evening | SUBCUTANEOUS | 2 refills | Status: DC

## 2020-02-04 NOTE — Patient Instructions (Addendum)
We've reduced your dose of metformin to 1 tablet twice daily for now.  We've increased your dose of Lantus to 25 units.  Continue to monitor your blood sugars.  Please schedule a follow up appointment in 3 months for diabetes check.  It was a pleasure to see you today!

## 2020-02-04 NOTE — Assessment & Plan Note (Signed)
Improved on atorvastatin, continue same.

## 2020-02-04 NOTE — Progress Notes (Signed)
Subjective:    Patient ID: Alyssa Stone, female    DOB: [birth year], 65 y.o.   MRN: [id number]  HPI  This visit occurred during the SARS-CoV-2 public health emergency.  Safety protocols were in place, including screening questions prior to the visit, additional usage of staff PPE, and extensive cleaning of exam room while observing appropriate contact time as indicated for disinfecting solutions.   Alyssa Stone is a 64 year old female with a history of type 2 diabetes, hyperlipidemia, anemia who presents today for follow up of diabetes.  Current medications include: Metformin XR 500, 2 tablets BID, Lantus 15 units daily. She is actually injecting 20 units nightly. Metformin has caused her to "feel badly" in general, doesn't feel well on 2 tabs BID.   She is checking her glucose 2 times daily and is getting readings of   AM fasting: mid 100's Bedtime: mid 100's, some higher 100's  Last A1C: 13.3 in January 2021, 8.0 today Last Eye Exam: UTD Last Foot Exam: UTD Pneumonia Vaccination: Completed in 2021 ACE/ARB: None. Urine micro negative in  Statin: atorvastatin    BP Readings from Last 3 Encounters:  02/04/20 110/72  11/26/19 127/70  10/15/19 122/82       Review of Systems  Eyes: Negative for visual disturbance.  Respiratory: Negative for shortness of breath.   Cardiovascular: Negative for chest pain.  Neurological: Negative for dizziness and headaches.        Past Medical History:  Diagnosis Date  . Allergy   . Anemia   . Chicken pox   . Depression   . Diabetes mellitus without complication ([place])   . Fainting episodes   . GERD (gastroesophageal reflux disease)   . Hyperlipidemia    controlled om meds      Social History   Socioeconomic History  . Marital status: Divorced    Spouse name: Not on file  . Number of children: 3  . Years of education: Not on file  . Highest education level: 12th grade  Occupational History  . Not on file  Tobacco Use    . Smoking status: Former Research scientist (life sciences)  . Smokeless tobacco: Never Used  Substance and Sexual Activity  . Alcohol use: Not Currently  . Drug use: Never  . Sexual activity: Not Currently  Other Topics Concern  . Not on file  Social History Narrative  . Not on file   Social Determinants of Health   Financial Resource Strain:   . Difficulty of Paying Living Expenses:   Food Insecurity:   . Worried About Charity fundraiser in the Last Year:   . Arboriculturist in the Last Year:   Transportation Needs:   . Film/video editor (Medical):   Marland Kitchen Lack of Transportation (Non-Medical):   Physical Activity:   . Days of Exercise per Week:   . Minutes of Exercise per Session:   Stress:   . Feeling of Stress :   Social Connections:   . Frequency of Communication with Friends and Family:   . Frequency of Social Gatherings with Friends and Family:   . Attends Religious Services:   . Active Member of Clubs or Organizations:   . Attends Archivist Meetings:   Marland Kitchen Marital Status:   Intimate Partner Violence:   . Fear of Current or Ex-Partner:   . Emotionally Abused:   Marland Kitchen Physically Abused:   . Sexually Abused:     Past Surgical History:  Procedure Laterality Date  .  ANAL FISSURE REPAIR    . CESAREAN SECTION    . TONSILLECTOMY Bilateral     Family History  Problem Relation Age of Onset  . Cancer Mother   . Diabetes Mother   . Miscarriages / [place] Mother   . Hypertension Father   . Heart attack Father   . Cancer Sister   . Diabetes Sister   . Diabetes Brother   . Heart attack Brother   . Kidney disease Brother   . Hyperlipidemia Brother   . Colon cancer Neg Hx   . Colon polyps Neg Hx   . Esophageal cancer Neg Hx   . Rectal cancer Neg Hx   . Stomach cancer Neg Hx     No Known Allergies  Current Outpatient Medications on File Prior to Visit  Medication Sig Dispense Refill  . atorvastatin (LIPITOR) 40 MG tablet Take 1 tablet (40 mg total) by mouth every  evening. For cholesterol. 90 tablet 3  . blood glucose meter kit and supplies KIT Dispense based on patient and insurance preference. Use up to four times daily as directed. (FOR ICD-9 250.00, 250.01). 1 each 0  . Insulin Pen Needle (PEN NEEDLES) 31G X 6 MM MISC Use nightly with insulin pen for diabetes. 100 each 2  . OneTouch Delica Lancets 28F MISC USE TO TEST BLOOD SUGAR UP TO 4 TIMES A DAY 100 each 5  . ONETOUCH VERIO test strip USE TO TEST BLOOD SUGAR UP TO 4 TIMES DAILY 100 strip 5   Current Facility-Administered Medications on File Prior to Visit  Medication Dose Route Frequency Provider Last Rate Last Admin  . 0.9 %  sodium chloride infusion  500 mL Intravenous Continuous Mansouraty, Telford Nab., MD        BP 110/72   Pulse (!) 108   Temp (!) 96.2 F (35.7 C) (Temporal)   Ht 5' 7.25" (1.708 m)   Wt 194 lb (88 kg)   SpO2 98%   BMI 30.16 kg/m    Objective:   Physical Exam  Cardiovascular: Normal rate and regular rhythm.  Respiratory: Effort normal and breath sounds normal.  Musculoskeletal:     Cervical back: Neck supple.  Skin: Skin is warm and dry.           Assessment & Plan:

## 2020-02-04 NOTE — Assessment & Plan Note (Signed)
A1C today of 8.0 which is significant improvement from her last check in January 2021.  She would like to come off of insulin at some point, this may be possible but we would likely have to add in another oral agent.  A1c significantly improved, not at goal yet. Metformin is causing her to feel bad, reduce down to 500 mg twice daily. Increase Lantus to 25 units nightly for now, especially given since we are reducing Metformin and that her A1c is not at goal.  Urine microalbumin negative. Foot exam up-to-date. Pneumonia vaccination up-to-date. Eye exam up-to-date. Managed on statin therapy.  Follow-up in 3 months.

## 2020-02-18 DIAGNOSIS — E1140 Type 2 diabetes mellitus with diabetic neuropathy, unspecified: Secondary | ICD-10-CM

## 2020-02-19 MED ORDER — LANTUS SOLOSTAR 100 UNIT/ML ~~LOC~~ SOPN: 28.0000 [IU] | PEN_INJECTOR | Freq: Every evening | SUBCUTANEOUS | 2 refills | Status: DC

## 2020-03-04 DIAGNOSIS — H04123 Dry eye syndrome of bilateral lacrimal glands: Secondary | ICD-10-CM | POA: Diagnosis not present

## 2020-03-04 DIAGNOSIS — E113293 Type 2 diabetes mellitus with mild nonproliferative diabetic retinopathy without macular edema, bilateral: Secondary | ICD-10-CM | POA: Diagnosis not present

## 2020-05-05 ENCOUNTER — Ambulatory Visit: Payer: PPO | Admitting: Primary Care

## 2020-05-12 ENCOUNTER — Other Ambulatory Visit: Payer: Self-pay

## 2020-05-12 ENCOUNTER — Ambulatory Visit (INDEPENDENT_AMBULATORY_CARE_PROVIDER_SITE_OTHER): Payer: PPO | Admitting: Primary Care

## 2020-05-12 VITALS — BP 118/72 | HR 89 | Temp 97.6°F | Ht 67.0 in | Wt 204.0 lb

## 2020-05-12 DIAGNOSIS — E1140 Type 2 diabetes mellitus with diabetic neuropathy, unspecified: Secondary | ICD-10-CM | POA: Diagnosis not present

## 2020-05-12 LAB — POCT GLYCOSYLATED HEMOGLOBIN (HGB A1C): Hemoglobin A1C: 8.3 % — AB (ref 4.0–5.6)

## 2020-05-12 MED ORDER — GLIPIZIDE 5 MG PO TABS: 5.0000 mg | ORAL_TABLET | Freq: Two times a day (BID) | ORAL | 1 refills | Status: DC

## 2020-05-12 NOTE — Assessment & Plan Note (Signed)
A1C today of 8.3 which is slightly worse than last visit. Her diet overall has significantly improved, recommended exercise.  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  Continue Metformin and Lantus as is. Add Glipizide 5 mg BID.  We will plan to see her back in 3 months for follow up.

## 2020-05-12 NOTE — Progress Notes (Signed)
Subjective:    Patient ID: Alyssa Stone, female    DOB: 06-13-1956, 64 y.o.   MRN: [id number]  HPI  This visit occurred during the SARS-CoV-2 public health emergency.  Safety protocols were in place, including screening questions prior to the visit, additional usage of staff PPE, and extensive cleaning of exam room while observing appropriate contact time as indicated for disinfecting solutions.   Alyssa Stone is a 64 year old female with a history of type 2 diabetes, anemia, hyperlipidemia who presents today for follow up of diabetes.  Current medications include: Metformin XR 500 mg BID, Lantus 28 units HS.   She is checking her blood glucose 1 times daily and is getting readings of:  AM fasting: 120-170's, 150 on average  Last A1C: 8.0 in June 2021, 8.3 today Last Eye Exam: UTD Last Foot Exam: UTD Pneumonia Vaccination: Completed in 2021 ACE/ARB: None. Urine microalbumin UTD Statin: Lipitor  She is trying to work on her diet, has cut out a lot of sugar intake. Little bread and starch intake.   BP Readings from Last 3 Encounters:  05/12/20 118/72  02/04/20 110/72  11/26/19 127/70   Wt Readings from Last 3 Encounters:  05/12/20 204 lb (92.5 kg)  02/04/20 194 lb (88 kg)  11/26/19 199 lb (90.3 kg)     Review of Systems  Eyes: Negative for visual disturbance.  Respiratory: Negative for shortness of breath.   Cardiovascular: Negative for chest pain.  Neurological: Negative for dizziness, numbness and headaches.       Past Medical History:  Diagnosis Date  . Allergy   . Anemia   . Chicken pox   . Depression   . Diabetes mellitus without complication ([place])   . Fainting episodes   . GERD (gastroesophageal reflux disease)   . Hyperlipidemia    controlled om meds      Social History   Socioeconomic History  . Marital status: Divorced    Spouse name: Not on file  . Number of children: 3  . Years of education: Not on file  . Highest education level: 12th  grade  Occupational History  . Not on file  Tobacco Use  . Smoking status: Former Research scientist (life sciences)  . Smokeless tobacco: Never Used  Substance and Sexual Activity  . Alcohol use: Not Currently  . Drug use: Never  . Sexual activity: Not Currently  Other Topics Concern  . Not on file  Social History Narrative  . Not on file   Social Determinants of Health   Financial Resource Strain:   . Difficulty of Paying Living Expenses: Not on file  Food Insecurity:   . Worried About Charity fundraiser in the Last Year: Not on file  . Ran Out of Food in the Last Year: Not on file  Transportation Needs:   . Lack of Transportation (Medical): Not on file  . Lack of Transportation (Non-Medical): Not on file  Physical Activity:   . Days of Exercise per Week: Not on file  . Minutes of Exercise per Session: Not on file  Stress:   . Feeling of Stress : Not on file  Social Connections:   . Frequency of Communication with Friends and Family: Not on file  . Frequency of Social Gatherings with Friends and Family: Not on file  . Attends Religious Services: Not on file  . Active Member of Clubs or Organizations: Not on file  . Attends Archivist Meetings: Not on file  . Marital  Status: Not on file  Intimate Partner Violence:   . Fear of Current or Ex-Partner: Not on file  . Emotionally Abused: Not on file  . Physically Abused: Not on file  . Sexually Abused: Not on file    Past Surgical History:  Procedure Laterality Date  . ANAL FISSURE REPAIR    . CESAREAN SECTION    . TONSILLECTOMY Bilateral     Family History  Problem Relation Age of Onset  . Cancer Mother   . Diabetes Mother   . Miscarriages / [place] Mother   . Hypertension Father   . Heart attack Father   . Cancer Sister   . Diabetes Sister   . Diabetes Brother   . Heart attack Brother   . Kidney disease Brother   . Hyperlipidemia Brother   . Colon cancer Neg Hx   . Colon polyps Neg Hx   . Esophageal cancer Neg Hx     . Rectal cancer Neg Hx   . Stomach cancer Neg Hx     No Known Allergies  Current Outpatient Medications on File Prior to Visit  Medication Sig Dispense Refill  . atorvastatin (LIPITOR) 40 MG tablet Take 1 tablet (40 mg total) by mouth every evening. For cholesterol. 90 tablet 3  . insulin glargine (LANTUS SOLOSTAR) 100 UNIT/ML Solostar Pen Inject 28 Units into the skin every evening. For diabetes 5 pen 2  . metFORMIN (GLUCOPHAGE XR) 500 MG 24 hr tablet Take 1 tablet (500 mg total) by mouth 2 (two) times daily with a meal. For diabetes. 180 tablet 3   Current Facility-Administered Medications on File Prior to Visit  Medication Dose Route Frequency Provider Last Rate Last Admin  . 0.9 %  sodium chloride infusion  500 mL Intravenous Continuous Mansouraty, Telford Nab., MD        BP 118/72   Pulse 89   Temp 97.6 F (36.4 C) (Temporal)   Ht 5\' 7"  (1.702 m)   Wt 204 lb (92.5 kg)   SpO2 96%   BMI 31.95 kg/m    Objective:   Physical Exam Cardiovascular:     Rate and Rhythm: Normal rate and regular rhythm.  Pulmonary:     Effort: Pulmonary effort is normal.     Breath sounds: Normal breath sounds.  Musculoskeletal:     Cervical back: Neck supple.  Skin:    General: Skin is warm and dry.            Assessment & Plan:

## 2020-05-12 NOTE — Patient Instructions (Signed)
Start glipizide 5 mg twice daily for diabetes.  Continue taking Metformin 500 mg twice daily for diabetes.  Continue Lantus insulin 28 units nightly.   Continue to work on a healthy diet. Ensure you are consuming 64 ounces of water daily.  Start exercising. You should be getting 150 minutes of moderate intensity exercise weekly.  Please schedule a follow up appointment in 3 months for a diabetes check.  It was a pleasure to see you today!   Diabetes Mellitus and Nutrition, Adult When you have diabetes (diabetes mellitus), it is very important to have healthy eating habits because your blood sugar (glucose) levels are greatly affected by what you eat and drink. Eating healthy foods in the appropriate amounts, at about the same times every day, can help you:  Control your blood glucose.  Lower your risk of heart disease.  Improve your blood pressure.  Reach or maintain a healthy weight. Every person with diabetes is different, and each person has different needs for a meal plan. Your health care provider may recommend that you work with a diet and nutrition specialist (dietitian) to make a meal plan that is best for you. Your meal plan may vary depending on factors such as:  The calories you need.  The medicines you take.  Your weight.  Your blood glucose, blood pressure, and cholesterol levels.  Your activity level.  Other health conditions you have, such as heart or kidney disease. How do carbohydrates affect me? Carbohydrates, also called carbs, affect your blood glucose level more than any other type of food. Eating carbs naturally raises the amount of glucose in your blood. Carb counting is a method for keeping track of how many carbs you eat. Counting carbs is important to keep your blood glucose at a healthy level, especially if you use insulin or take certain oral diabetes medicines. It is important to know how many carbs you can safely have in each meal. This is  different for every person. Your dietitian can help you calculate how many carbs you should have at each meal and for each snack. Foods that contain carbs include:  Bread, cereal, rice, pasta, and crackers.  Potatoes and corn.  Peas, beans, and lentils.  Milk and yogurt.  Fruit and juice.  Desserts, such as cakes, cookies, ice cream, and candy. How does alcohol affect me? Alcohol can cause a sudden decrease in blood glucose (hypoglycemia), especially if you use insulin or take certain oral diabetes medicines. Hypoglycemia can be a life-threatening condition. Symptoms of hypoglycemia (sleepiness, dizziness, and confusion) are similar to symptoms of having too much alcohol. If your health care provider says that alcohol is safe for you, follow these guidelines:  Limit alcohol intake to no more than 1 drink per day for nonpregnant women and 2 drinks per day for men. One drink equals 12 oz of beer, 5 oz of wine, or 1 oz of hard liquor.  Do not drink on an empty stomach.  Keep yourself hydrated with water, diet soda, or unsweetened iced tea.  Keep in mind that regular soda, juice, and other mixers may contain a lot of sugar and must be counted as carbs. What are tips for following this plan?  Reading food labels  Start by checking the serving size on the "Nutrition Facts" label of packaged foods and drinks. The amount of calories, carbs, fats, and other nutrients listed on the label is based on one serving of the item. Many items contain more than one serving per  package.  Check the total grams (g) of carbs in one serving. You can calculate the number of servings of carbs in one serving by dividing the total carbs by 15. For example, if a food has 30 g of total carbs, it would be equal to 2 servings of carbs.  Check the number of grams (g) of saturated and trans fats in one serving. Choose foods that have low or no amount of these fats.  Check the number of milligrams (mg) of salt  (sodium) in one serving. Most people should limit total sodium intake to less than 2,300 mg per day.  Always check the nutrition information of foods labeled as "low-fat" or "nonfat". These foods may be higher in added sugar or refined carbs and should be avoided.  Talk to your dietitian to identify your daily goals for nutrients listed on the label. Shopping  Avoid buying canned, premade, or processed foods. These foods tend to be high in fat, sodium, and added sugar.  Shop around the outside edge of the grocery store. This includes fresh fruits and vegetables, bulk grains, fresh meats, and fresh dairy. Cooking  Use low-heat cooking methods, such as baking, instead of high-heat cooking methods like deep frying.  Cook using healthy oils, such as olive, canola, or sunflower oil.  Avoid cooking with butter, cream, or high-fat meats. Meal planning  Eat meals and snacks regularly, preferably at the same times every day. Avoid going long periods of time without eating.  Eat foods high in fiber, such as fresh fruits, vegetables, beans, and whole grains. Talk to your dietitian about how many servings of carbs you can eat at each meal.  Eat 4-6 ounces (oz) of lean protein each day, such as lean meat, chicken, fish, eggs, or tofu. One oz of lean protein is equal to: ? 1 oz of meat, chicken, or fish. ? 1 egg. ?  cup of tofu.  Eat some foods each day that contain healthy fats, such as avocado, nuts, seeds, and fish. Lifestyle  Check your blood glucose regularly.  Exercise regularly as told by your health care provider. This may include: ? 150 minutes of moderate-intensity or vigorous-intensity exercise each week. This could be brisk walking, biking, or water aerobics. ? Stretching and doing strength exercises, such as yoga or weightlifting, at least 2 times a week.  Take medicines as told by your health care provider.  Do not use any products that contain nicotine or tobacco, such as  cigarettes and e-cigarettes. If you need help quitting, ask your health care provider.  Work with a Social worker or diabetes educator to identify strategies to manage stress and any emotional and social challenges. Questions to ask a health care provider  Do I need to meet with a diabetes educator?  Do I need to meet with a dietitian?  What number can I call if I have questions?  When are the best times to check my blood glucose? Where to find more information:  American Diabetes Association: diabetes.org  Academy of Nutrition and Dietetics: www.eatright.[employer] of Diabetes and Digestive and Kidney Diseases (NIH): [employer] Summary  A healthy meal plan will help you control your blood glucose and maintain a healthy lifestyle.  Working with a diet and nutrition specialist (dietitian) can help you make a meal plan that is best for you.  Keep in mind that carbohydrates (carbs) and alcohol have immediate effects on your blood glucose levels. It is important to count carbs and to use alcohol  carefully. This information is not intended to replace advice given to you by your health care provider. Make sure you discuss any questions you have with your health care provider. Document Revised: 07/19/2017 Document Reviewed: 09/10/2016 Elsevier Patient Education  2020 [employer].

## 2020-06-23 ENCOUNTER — Telehealth: Payer: Self-pay

## 2020-06-23 NOTE — Telephone Encounter (Signed)
Vm from pt at triage.  Pt states she has had the stomach flu but has gotten over that.  States she now thinks she has a UTI since she was not able to drink water while she was sick.  Pt is asking [name] to call in something to take care of it.  Pt wants call back at [phone].  Lvm asking pt to call back.  Need details of sxs and try to schedule visit (probably virtual).

## 2020-06-24 NOTE — Telephone Encounter (Signed)
Noted  

## 2020-06-24 NOTE — Telephone Encounter (Signed)
Called patient states she is feeling better now thinks it was just because she was dehydrated. No symptoms at this time.

## 2020-07-07 ENCOUNTER — Other Ambulatory Visit: Payer: Self-pay | Admitting: Primary Care

## 2020-07-07 DIAGNOSIS — E1140 Type 2 diabetes mellitus with diabetic neuropathy, unspecified: Secondary | ICD-10-CM

## 2020-08-10 ENCOUNTER — Encounter: Payer: Self-pay | Admitting: Primary Care

## 2020-08-10 ENCOUNTER — Ambulatory Visit (INDEPENDENT_AMBULATORY_CARE_PROVIDER_SITE_OTHER): Payer: PPO | Admitting: Primary Care

## 2020-08-10 ENCOUNTER — Other Ambulatory Visit: Payer: Self-pay

## 2020-08-10 ENCOUNTER — Telehealth: Payer: Self-pay

## 2020-08-10 VITALS — BP 120/72 | HR 72 | Temp 98.1°F | Ht 67.0 in | Wt 181.0 lb

## 2020-08-10 DIAGNOSIS — E1140 Type 2 diabetes mellitus with diabetic neuropathy, unspecified: Secondary | ICD-10-CM

## 2020-08-10 DIAGNOSIS — R000 Tachycardia, unspecified: Secondary | ICD-10-CM | POA: Insufficient documentation

## 2020-08-10 DIAGNOSIS — R3914 Feeling of incomplete bladder emptying: Secondary | ICD-10-CM | POA: Diagnosis not present

## 2020-08-10 DIAGNOSIS — D649 Anemia, unspecified: Secondary | ICD-10-CM

## 2020-08-10 LAB — CBC
HCT: 37.9 % (ref 36.0–46.0)
Hemoglobin: 12.7 g/dL (ref 12.0–15.0)
MCHC: 33.5 g/dL (ref 30.0–36.0)
MCV: 90 fl (ref 78.0–100.0)
Platelets: 340 10*3/uL (ref 150.0–400.0)
RBC: 4.21 Mil/uL (ref 3.87–5.11)
RDW: 13.4 % (ref 11.5–15.5)
WBC: 11.9 10*3/uL — ABNORMAL HIGH (ref 4.0–10.5)

## 2020-08-10 LAB — BASIC METABOLIC PANEL
BUN: 21 mg/dL (ref 6–23)
CO2: 28 mEq/L (ref 19–32)
Calcium: 9.4 mg/dL (ref 8.4–10.5)
Chloride: 101 mEq/L (ref 96–112)
Creatinine, Ser: 0.54 mg/dL (ref 0.40–1.20)
GFR: 96.28 mL/min (ref >60.00)
Glucose, Bld: 102 mg/dL — ABNORMAL HIGH (ref 70–99)
Potassium: 4.3 mEq/L (ref 3.5–5.1)
Sodium: 136 mEq/L (ref 135–145)

## 2020-08-10 LAB — POC URINALSYSI DIPSTICK (AUTOMATED)
Bilirubin, UA: NEGATIVE
Glucose, UA: NEGATIVE
Ketones, UA: NEGATIVE
Nitrite, UA: NEGATIVE
Protein, UA: POSITIVE — AB
Spec Grav, UA: 1.02 (ref 1.010–1.025)
Urobilinogen, UA: 0.2 E.U./dL
pH, UA: 5 (ref 5.0–8.0)

## 2020-08-10 LAB — POCT GLYCOSYLATED HEMOGLOBIN (HGB A1C): Hemoglobin A1C: 6.9 % — AB (ref 4.0–5.6)

## 2020-08-10 LAB — TSH: TSH: 4.14 u[IU]/mL (ref 0.35–4.50)

## 2020-08-10 MED ORDER — LANTUS SOLOSTAR 100 UNIT/ML ~~LOC~~ SOPN: 20.0000 [IU] | PEN_INJECTOR | Freq: Every evening | SUBCUTANEOUS | 5 refills | Status: DC

## 2020-08-10 MED ORDER — METOPROLOL SUCCINATE ER 25 MG PO TB24: 25.0000 mg | ORAL_TABLET | Freq: Every day | ORAL | 0 refills | Status: DC

## 2020-08-10 NOTE — Assessment & Plan Note (Signed)
Acute for the last month. No other symptoms. UA today with 3+ leuks, trace blood. Culture sent.  Await results.

## 2020-08-10 NOTE — Progress Notes (Signed)
Subjective:    Patient ID: Alyssa Stone, female    DOB: 14-Aug-1956, 64 y.o.   MRN: [id number]  HPI  This visit occurred during the SARS-CoV-2 public health emergency.  Safety protocols were in place, including screening questions prior to the visit, additional usage of staff PPE, and extensive cleaning of exam room while observing appropriate contact time as indicated for disinfecting solutions.   Alyssa Stone is a 64 year old female with a history of type 2 diabetes, hyperlipidemia who presents today for follow up of diabetes. She would also like to mention incomplete bladder emptying.  1) Incomplete Bladder EmptyinShe would also like to mention a sensation of incomplete bladder emptying that began about one month ago. Symptoms did not begin until after she had a "stomach bug" about one month ago. Symptoms lasted for about one week and resolved, except since then she's noticed a sensation of incomplete bladder emptying. She denies dysuria, hematuria, flank pain, vaginal symptoms.   2) Type 2 Diabetes: Current medications include: Glipizide 5 mg BID, metformin XR 500 mg BID, Lantus 28 units.  She is checking her blood glucose 1 times daily and is getting readings of:  AM fasting: 85-130  Last A1C: 8.3 in September 2021, 6.9 today Last Eye Exam: UTD Last Foot Exam: UTD Pneumonia Vaccination: UTD ACE/ARB: None, urine micro negative Statin: atorvastatin  BP Readings from Last 3 Encounters:  08/10/20 120/72  05/12/20 118/72  02/04/20 110/72     Review of Systems  Eyes: Negative for visual disturbance.  Respiratory: Negative for shortness of breath.   Cardiovascular: Negative for chest pain.  Genitourinary: Negative for dysuria, frequency, urgency and vaginal discharge.       Incomplete bladder emptying  Neurological: Positive for numbness. Negative for dizziness.       Past Medical History:  Diagnosis Date  . Allergy   . Anemia   . Chicken pox   . Depression   .  Diabetes mellitus without complication ([place])   . Fainting episodes   . GERD (gastroesophageal reflux disease)   . Hyperlipidemia    controlled om meds      Social History   Socioeconomic History  . Marital status: Divorced    Spouse name: Not on file  . Number of children: 3  . Years of education: Not on file  . Highest education level: 12th grade  Occupational History  . Not on file  Tobacco Use  . Smoking status: Former Research scientist (life sciences)  . Smokeless tobacco: Never Used  Substance and Sexual Activity  . Alcohol use: Not Currently  . Drug use: Never  . Sexual activity: Not Currently  Other Topics Concern  . Not on file  Social History Narrative  . Not on file   Social Determinants of Health   Financial Resource Strain: Not on file  Food Insecurity: Not on file  Transportation Needs: Not on file  Physical Activity: Not on file  Stress: Not on file  Social Connections: Not on file  Intimate Partner Violence: Not on file    Past Surgical History:  Procedure Laterality Date  . ANAL FISSURE REPAIR    . CESAREAN SECTION    . TONSILLECTOMY Bilateral     Family History  Problem Relation Age of Onset  . Cancer Mother   . Diabetes Mother   . Miscarriages / [place] Mother   . Hypertension Father   . Heart attack Father   . Cancer Sister   . Diabetes Sister   .  Diabetes Brother   . Heart attack Brother   . Kidney disease Brother   . Hyperlipidemia Brother   . Colon cancer Neg Hx   . Colon polyps Neg Hx   . Esophageal cancer Neg Hx   . Rectal cancer Neg Hx   . Stomach cancer Neg Hx     No Known Allergies  Current Outpatient Medications on File Prior to Visit  Medication Sig Dispense Refill  . atorvastatin (LIPITOR) 40 MG tablet Take 1 tablet (40 mg total) by mouth every evening. For cholesterol. 90 tablet 3  . glipiZIDE (GLUCOTROL) 5 MG tablet Take 1 tablet (5 mg total) by mouth 2 (two) times daily before a meal. For diabetes. 180 tablet 1  . insulin glargine  (LANTUS SOLOSTAR) 100 UNIT/ML Solostar Pen Inject 28 Units into the skin every evening. For diabetes 5 pen 2  . Insulin Pen Needle (B-D ULTRAFINE III SHORT PEN) 31G X 8 MM MISC USE NIGHTLY WITH INSULIN PEN FOR DIABETES. 100 each 3  . metFORMIN (GLUCOPHAGE XR) 500 MG 24 hr tablet Take 1 tablet (500 mg total) by mouth 2 (two) times daily with a meal. For diabetes. 180 tablet 3   Current Facility-Administered Medications on File Prior to Visit  Medication Dose Route Frequency Provider Last Rate Last Admin  . 0.9 %  sodium chloride infusion  500 mL Intravenous Continuous Mansouraty, Telford Nab., MD        BP 120/72   Pulse 72   Temp 98.1 F (36.7 C) (Temporal)   Ht 5\' 7"  (1.702 m)   Wt 181 lb (82.1 kg)   SpO2 98%   BMI 28.35 kg/m    Objective:   Physical Exam Constitutional:      Appearance: She is well-nourished.  Cardiovascular:     Rate and Rhythm: Regular rhythm. Tachycardia present.  Pulmonary:     Effort: Pulmonary effort is normal.     Breath sounds: Normal breath sounds.  Musculoskeletal:     Cervical back: Neck supple.  Skin:    General: Skin is warm and dry.  Psychiatric:        Mood and Affect: Mood and affect normal.            Assessment & Plan:

## 2020-08-10 NOTE — Telephone Encounter (Signed)
[facility] scheduled patient  for 09/15/20. I asked patient if she would be willing to go to [facility] if they can see her sooner would she agree. She said she wanted to stay with [facility] and keep this appointment.  Then I also got a call back from cardiology and they said they could work her in on 08/31/20 at [facility] location with Dr Senaida Lange but patient said that it was only a week sooner and  she wanted to stay with 1/27 date and see Dr Cathie Olden. I did tell patient that [name] wanted her to be seen sooner, but to at least let us know if her symptoms change or any issues before she sees cardiology. Patient verbalized understanding and appreciated the call.

## 2020-08-10 NOTE — Telephone Encounter (Signed)
Left message for patient to call back. Need to see if patient would be willing to go to Cardiology office in [place] if they can get her in sooner.

## 2020-08-10 NOTE — Patient Instructions (Addendum)
Stop by the lab prior to leaving today. I will notify you of your results once received.   You will be contacted regarding your referral to cardiology.  Please let us know if you have not been contacted within a few days.  We've reduced your Lantus to 20 units, but increase it up to 25 units if you notice glucose readings at or above 150 consistently.  Start monitoring your heart rate everyday as discussed.  Start metoprolol succinate 25 mg once daily for heart rate. Wait until you hear from me regarding your labs.  Please schedule a follow up appointment in 6 months.   It was a pleasure to see you today!

## 2020-08-10 NOTE — Assessment & Plan Note (Signed)
A1C improved and at goal with level of 6.9 today. She insists on reducing insulin with goals of eventually discontinuing.   I recommended that we continue insulin for now, but I will reduce her dose down to 20 units given that we added the Glipizide last visit. We did discuss for her to closely monitor glucose readings and increase up to 25 units if glucose readings increase to 150 or higher on a consistent basis. She agrees.  Continue Metformin XR 500 mg BID. Continue Glipizide 5 mg BID. Reduce Lantus to 20 units daily, increase to 25 units if needed.  Follow up in 6 months.

## 2020-08-10 NOTE — Assessment & Plan Note (Addendum)
Incidental finding on exam today, interestingly enough her pulse ox reader showed 72, but this is absolutely not true based from exam and ECG.  After further discussion she does notice intermittent palpitations at times, has a strong FH of CAD and her brother recently had bypass surgery.   ECG today with right axis, left fascicular block?, ST changes. No old ECG to compare.   Referral placed to cardiology. Checking TSH, CBC, BMP today. UA today maybe with UTI, but will check culture to ensure. I do not suspect this to be the main cause for tachycardia given intermittent history of palpations.   Rx for metoprolol succinate 25 mg sent to pharmacy.  Strict ED precautions provided. She is asymptomatic in the office today. Appears well.

## 2020-08-10 NOTE — Assessment & Plan Note (Signed)
History of anemia, sinus tachycardia today. CBC pending.

## 2020-08-11 ENCOUNTER — Other Ambulatory Visit (INDEPENDENT_AMBULATORY_CARE_PROVIDER_SITE_OTHER): Payer: PPO

## 2020-08-11 DIAGNOSIS — D72829 Elevated white blood cell count, unspecified: Secondary | ICD-10-CM

## 2020-08-11 LAB — WHITE CELL DIFFERENTIAL
Basophils Relative: 1.1 % (ref 0.0–3.0)
Eosinophils Relative: 2.1 % (ref 0.0–5.0)
Lymphocytes Relative: 24.4 % (ref 12.0–46.0)
Monocytes Relative: 5.3 % (ref 3.0–12.0)
Neutrophils Relative %: 67.1 % (ref 43.0–77.0)

## 2020-08-12 ENCOUNTER — Other Ambulatory Visit: Payer: Self-pay | Admitting: Primary Care

## 2020-08-12 DIAGNOSIS — N3001 Acute cystitis with hematuria: Secondary | ICD-10-CM

## 2020-08-12 LAB — URINE CULTURE
MICRO NUMBER:: 11348552
SPECIMEN QUALITY:: ADEQUATE

## 2020-08-12 MED ORDER — SULFAMETHOXAZOLE-TRIMETHOPRIM 800-160 MG PO TABS: 1.0000 | ORAL_TABLET | Freq: Two times a day (BID) | ORAL | 0 refills | Status: DC

## 2020-08-18 ENCOUNTER — Ambulatory Visit: Payer: PPO | Admitting: Primary Care

## 2020-08-24 ENCOUNTER — Other Ambulatory Visit: Payer: Self-pay | Admitting: Primary Care

## 2020-08-24 DIAGNOSIS — E785 Hyperlipidemia, unspecified: Secondary | ICD-10-CM

## 2020-08-24 NOTE — Telephone Encounter (Signed)
Last OV 08/10/20 Last fill 09/03/19  #90/3

## 2020-09-01 ENCOUNTER — Other Ambulatory Visit: Payer: Self-pay | Admitting: Primary Care

## 2020-09-01 DIAGNOSIS — R000 Tachycardia, unspecified: Secondary | ICD-10-CM

## 2020-09-02 DIAGNOSIS — E113293 Type 2 diabetes mellitus with mild nonproliferative diabetic retinopathy without macular edema, bilateral: Secondary | ICD-10-CM | POA: Diagnosis not present

## 2020-09-06 ENCOUNTER — Other Ambulatory Visit: Payer: Self-pay | Admitting: Primary Care

## 2020-09-06 DIAGNOSIS — R000 Tachycardia, unspecified: Secondary | ICD-10-CM

## 2020-09-13 ENCOUNTER — Encounter: Payer: Self-pay | Admitting: Cardiovascular Disease

## 2020-09-13 NOTE — Progress Notes (Signed)
Cardiology Office Note:    Date:  09/15/2020   ID:  Alyssa Stone, DOB 1956-02-03, MRN [id number]  PCP:  Pleas Koch, NP  [facility] Cardiologist:  Ashad Fawbush [facility] Electrophysiologist:  None   Referring MD: Pleas Koch, NP   Chief Complaint  Patient presents with  . Tachycardia          Jan. 27, 2022   Alyssa Stone is a 65 y.o. female with a hx of  Sinus tachycardia. Hx of DM, HLD  + Fam hx of CAD  We were asked to see her today by Allie Bossier, NP for further evaluation of her sinus tachycardia.  EKGrevealed sinus tachycardia at a rate of 127.  She was started on metoprolol. Blood work revealed a normal TSH of 4.14.  Her white blood cell count was mildly elevated at 11.9.  Her basic metabolic profile was within normal limits with the exception of glucose of 102. Urinalysis revealed cloudy urine.  There was 3+ leukocytes and she was positive for protein.  Nitrates were negative.  Has DOE with climbing stairs.  Does not exercise Cleans housees, does yard work .  No chest pain .   Has occasional exertional chest pressure , has diabetes Had covid a year ago - has had covid twice feb and then thanksgiving  Has not felt well since having covid.    Knows to take vit. C, D, zinc Mother had cad / angina  - treated medically Father had CABG    Past Medical History:  Diagnosis Date  . Allergy   . Anemia   . Chicken pox   . Depression   . Diabetes mellitus without complication ([place])   . Fainting episodes   . GERD (gastroesophageal reflux disease)   . Hyperlipidemia    controlled om meds     Past Surgical History:  Procedure Laterality Date  . ANAL FISSURE REPAIR    . CESAREAN SECTION    . TONSILLECTOMY Bilateral     Current Medications: Current Meds  Medication Sig  . atorvastatin (LIPITOR) 40 MG tablet TAKE 1 TABLET BY MOUTH EVERY EVENING FOR CHOLESTEROL  . glipiZIDE (GLUCOTROL) 5 MG tablet Take 1 tablet (5 mg total) by mouth 2 (two)  times daily before a meal. For diabetes.  . insulin glargine (LANTUS SOLOSTAR) 100 UNIT/ML Solostar Pen Inject 20 Units into the skin every evening. For diabetes  . Insulin Pen Needle (B-D ULTRAFINE III SHORT PEN) 31G X 8 MM MISC USE NIGHTLY WITH INSULIN PEN FOR DIABETES.  . Lancets (ONETOUCH DELICA PLUS [id number]) MISC 1 each by Other route as directed.  . metFORMIN (GLUCOPHAGE XR) 500 MG 24 hr tablet Take 1 tablet (500 mg total) by mouth 2 (two) times daily with a meal. For diabetes.  . metoprolol succinate (TOPROL-XL) 25 MG 24 hr tablet TAKE 1 TABLET BY MOUTH DAILY. FOR HEART RATE.  . metoprolol tartrate (LOPRESSOR) 100 MG tablet Take 1 pill (100 mg) 2 hours before your Coronary CT  . ONETOUCH VERIO test strip 1 each by Other route 4 (four) times daily.  Marland Kitchen sulfamethoxazole-trimethoprim (BACTRIM DS) 800-160 MG tablet Take 1 tablet by mouth 2 (two) times daily. For urinary tract infection.   Current Facility-Administered Medications for the 09/15/20 encounter (Office Visit) with Olene Godfrey, Wonda Cheng, MD  Medication  . 0.9 %  sodium chloride infusion     Allergies:   Patient has no known allergies.   Social History   Socioeconomic History  .  Marital status: Divorced    Spouse name: Not on file  . Number of children: 3  . Years of education: Not on file  . Highest education level: 12th grade  Occupational History  . Not on file  Tobacco Use  . Smoking status: Former Research scientist (life sciences)  . Smokeless tobacco: Never Used  Substance and Sexual Activity  . Alcohol use: Not Currently  . Drug use: Never  . Sexual activity: Not Currently  Other Topics Concern  . Not on file  Social History Narrative  . Not on file   Social Determinants of Health   Financial Resource Strain: Not on file  Food Insecurity: Not on file  Transportation Needs: Not on file  Physical Activity: Not on file  Stress: Not on file  Social Connections: Not on file     Family History: The patient's family history includes  Cancer in her mother and sister; Diabetes in her brother, mother, and sister; Heart attack in her brother and father; Hyperlipidemia in her brother; Hypertension in her father; Kidney disease in her brother; Miscarriages / [place] in her mother. There is no history of Colon cancer, Colon polyps, Esophageal cancer, Rectal cancer, or Stomach cancer.  ROS:   Please see the history of present illness.     All other systems reviewed and are negative.  EKGs/Labs/Other Studies Reviewed:    The following studies were reviewed today:   EKG:   Jan. 27, 2022:   S. Tach at 127.     Recent Labs: 10/15/2019: ALT 9 08/10/2020: BUN 21; Creatinine, Ser 0.54; Hemoglobin 12.7; Platelets 340.0; Potassium 4.3; Sodium 136; TSH 4.14  Recent Lipid Panel    Component Value Date/Time   CHOL 120 10/15/2019 0955   TRIG 159.0 (H) 10/15/2019 0955   HDL 32.00 (L) 10/15/2019 0955   CHOLHDL 4 10/15/2019 0955   VLDL 31.8 10/15/2019 0955   LDLCALC 56 10/15/2019 0955   LDLDIRECT 137.0 09/03/2019 1225     Risk Assessment/Calculations:       Physical Exam:    VS:  BP 112/70   Pulse 78   Ht 5' 7"  (1.702 m)   Wt 213 lb 12.8 oz (97 kg)   SpO2 97%   BMI 33.49 kg/m     Wt Readings from Last 3 Encounters:  09/15/20 213 lb 12.8 oz (97 kg)  08/10/20 181 lb (82.1 kg)  05/12/20 204 lb (92.5 kg)     GEN:  Well nourished, well developed in no acute distress HEENT: Normal NECK: No JVD; No carotid bruits LYMPHATICS: No lymphadenopathy CARDIAC: RRR, no murmurs, rubs, gallops RESPIRATORY:  Clear to auscultation without rales, wheezing or rhonchi  ABDOMEN: Soft, non-tender, non-distended MUSCULOSKELETAL:  No edema; No deformity  SKIN: Warm and dry NEUROLOGIC:  Alert and oriented x 3 PSYCHIATRIC:  Normal affect   ASSESSMENT:    1. Other forms of angina pectoris ([place])    PLAN:    In order of problems listed above:  1. Sinus tach:   Better with the additional of Toprol .   Cont   2.  Chest heaviness  :   Has hx of DM, HLD, + fam hx of CAD .  Has occasional episodes of chest tightness related to exertion. Will get a coronary CT angio  Will have her see an APP in 3 months.    Medication Adjustments/Labs and Tests Ordered: Current medicines are reviewed at length with the patient today.  Concerns regarding medicines are outlined above.  Orders Placed This Encounter  Procedures  . CT CORONARY MORPH W/CTA COR W/SCORE W/CA W/CM &/OR WO/CM  . CT CORONARY FRACTIONAL FLOW RESERVE DATA PREP  . CT CORONARY FRACTIONAL FLOW RESERVE FLUID ANALYSIS  . Basic Metabolic Panel (BMET)   Meds ordered this encounter  Medications  . metoprolol tartrate (LOPRESSOR) 100 MG tablet    Sig: Take 1 pill (100 mg) 2 hours before your Coronary CT    Dispense:  1 tablet    Refill:  0     Patient Instructions  Medication Instructions:  Your physician recommends that you continue on your current medications as directed. Please refer to the Current Medication list given to you today.  *If you need a refill on your cardiac medications before your next appointment, please call your pharmacy*   Lab Work: Your physician recommends that you return for lab work in: before your Coronary CT (approximately 2 days up to 1 week prior)  If you have labs (blood work) drawn today and your tests are completely normal, you will receive your results only by: Marland Kitchen MyChart Message (if you have MyChart) OR . A paper copy in the mail If you have any lab test that is abnormal or we need to change your treatment, we will call you to review the results.    Testing/Procedures: Cardiac CT scanning, (CAT scanning), is a noninvasive, special x-ray that produces cross-sectional images of the body using x-rays and a computer. CT scans help physicians diagnose and treat medical conditions. For some CT exams, a contrast material is used to enhance visibility in the area of the body being studied. CT scans provide greater clarity and reveal  more details than regular x-ray exams.    Follow-Up: At [facility], you and your health needs are our priority.  As part of our continuing mission to provide you with exceptional heart care, we have created designated Provider Care Teams.  These Care Teams include your primary Cardiologist (physician) and Advanced Practice Providers (APPs -  Physician Assistants and Nurse Practitioners) who all work together to provide you with the care you need, when you need it.   Your next appointment:   3 month(s)  The format for your next appointment:   In Person  Provider:      Other Instructions Your cardiac CT will be scheduled at one of the below locations:   [facility] [address] [place], [place] [address] [phone]  [place] [address] [place], [place] [address] [phone]  If scheduled at [facility], please arrive at the [facility] main entrance of [facility] 30 minutes prior to test start time. Proceed to the [facility] Radiology Department (first floor) to check-in and test prep.  If scheduled at [facility], please arrive 15 mins early for check-in and test prep.  Please follow these instructions carefully (unless otherwise directed):   On the Night Before the Test: . Be sure to Drink plenty of water. . Do not consume any caffeinated/decaffeinated beverages or chocolate 12 hours prior to your test. . Do not take any antihistamines 12 hours prior to your test.  On the Day of the Test: . Drink plenty of water. Do not drink any water within one hour of the test. . Do not eat any food 4 hours prior to the test. . You may take your regular medications prior to the test.  . Take metoprolol (Lopressor) **DO NOT TAKE YOUR TOPROL**  two hours prior to test. . FEMALES- please wear underwire-free bra if available        After the Test: . Drink  plenty of water. . After receiving IV contrast, you may experience a mild flushed feeling. This is normal. . On occasion, you may experience a mild rash up to 24 hours after the test. This is not dangerous. If this occurs, you can take Benadryl 25 mg and increase your fluid intake. . If you experience trouble breathing, this can be serious. If it is severe call 911 IMMEDIATELY. If it is mild, please call our office. . If you take any of these medications: Glipizide/Metformin, Avandament, Glucavance, please do not take 48 hours after completing test unless otherwise instructed.   Once we have confirmed authorization from your insurance company, we will call you to set up a date and time for your test. Based on how quickly your insurance processes prior authorizations requests, please allow up to 4 weeks to be contacted for scheduling your Cardiac CT appointment. Be advised that routine Cardiac CT appointments could be scheduled as many as 8 weeks after your provider has ordered it.  For non-scheduling related questions, please contact the cardiac imaging nurse navigator should you have any questions/concerns: Marchia Bond, Cardiac Imaging Nurse Navigator Burley Saver, Interim Cardiac Imaging Nurse [place] and Vascular Services Direct Office Dial: [phone]   For scheduling needs, including cancellations and rescheduling, please call [place], [phone].       Signed, Mertie Moores, MD  09/15/2020 6:20 PM    [facility]

## 2020-09-15 ENCOUNTER — Ambulatory Visit: Payer: PPO | Admitting: Cardiovascular Disease

## 2020-09-15 ENCOUNTER — Other Ambulatory Visit: Payer: Self-pay

## 2020-09-15 ENCOUNTER — Encounter: Payer: Self-pay | Admitting: Cardiovascular Disease

## 2020-09-15 VITALS — BP 112/70 | HR 78 | Ht 67.0 in | Wt 213.8 lb

## 2020-09-15 DIAGNOSIS — I208 Other forms of angina pectoris: Secondary | ICD-10-CM | POA: Diagnosis not present

## 2020-09-15 DIAGNOSIS — R000 Tachycardia, unspecified: Secondary | ICD-10-CM | POA: Diagnosis not present

## 2020-09-15 DIAGNOSIS — E785 Hyperlipidemia, unspecified: Secondary | ICD-10-CM | POA: Diagnosis not present

## 2020-09-15 MED ORDER — METOPROLOL TARTRATE 100 MG PO TABS: ORAL_TABLET | ORAL | 0 refills | Status: DC

## 2020-09-15 NOTE — Patient Instructions (Addendum)
Medication Instructions:  Your physician recommends that you continue on your current medications as directed. Please refer to the Current Medication list given to you today.  *If you need a refill on your cardiac medications before your next appointment, please call your pharmacy*   Lab Work: Your physician recommends that you return for lab work in: before your Coronary CT (approximately 2 days up to 1 week prior)  If you have labs (blood work) drawn today and your tests are completely normal, you will receive your results only by: Marland Kitchen MyChart Message (if you have MyChart) OR . A paper copy in the mail If you have any lab test that is abnormal or we need to change your treatment, we will call you to review the results.    Testing/Procedures: Cardiac CT scanning, (CAT scanning), is a noninvasive, special x-ray that produces cross-sectional images of the body using x-rays and a computer. CT scans help physicians diagnose and treat medical conditions. For some CT exams, a contrast material is used to enhance visibility in the area of the body being studied. CT scans provide greater clarity and reveal more details than regular x-ray exams.    Follow-Up: At [facility], you and your health needs are our priority.  As part of our continuing mission to provide you with exceptional heart care, we have created designated Provider Care Teams.  These Care Teams include your primary Cardiologist (physician) and Advanced Practice Providers (APPs -  Physician Assistants and Nurse Practitioners) who all work together to provide you with the care you need, when you need it.   Your next appointment:   3 month(s)  The format for your next appointment:   In Person  Provider:      Other Instructions Your cardiac CT will be scheduled at one of the below locations:   [facility] [address] [place], [place] [address] [phone]  [place] [address] [place], [place] [address] [phone]  If scheduled at [facility], please arrive at the [facility] main entrance of [facility] 30 minutes prior to test start time. Proceed to the [facility] Radiology Department (first floor) to check-in and test prep.  If scheduled at [facility], please arrive 15 mins early for check-in and test prep.  Please follow these instructions carefully (unless otherwise directed):   On the Night Before the Test: . Be sure to Drink plenty of water. . Do not consume any caffeinated/decaffeinated beverages or chocolate 12 hours prior to your test. . Do not take any antihistamines 12 hours prior to your test.  On the Day of the Test: . Drink plenty of water. Do not drink any water within one hour of the test. . Do not eat any food 4 hours prior to the test. . You may take your regular medications prior to the test.  . Take metoprolol (Lopressor) **DO NOT TAKE YOUR TOPROL** two hours prior to test. . FEMALES- please wear underwire-free bra if available        After the Test: . Drink plenty of water. . After receiving IV contrast, you may experience a mild flushed feeling. This is normal. . On occasion, you may experience a mild rash up to 24 hours after the test. This is not dangerous. If this occurs, you can take Benadryl 25 mg and increase your fluid intake. . If you experience trouble breathing, this can be serious. If  it is severe call 911 IMMEDIATELY. If it is mild, please call our office. . If you take any of these medications: Glipizide/Metformin, Avandament, Glucavance, please do not take 48 hours after completing test unless otherwise instructed.   Once we have confirmed authorization from your insurance company, we will call you to set up a date and time for your test. Based on how quickly your insurance processes prior authorizations requests, please allow up to  4 weeks to be contacted for scheduling your Cardiac CT appointment. Be advised that routine Cardiac CT appointments could be scheduled as many as 8 weeks after your provider has ordered it.  For non-scheduling related questions, please contact the cardiac imaging nurse navigator should you have any questions/concerns: Marchia Bond, Cardiac Imaging Nurse Navigator Burley Saver, Interim Cardiac Imaging Nurse [place] and Vascular Services Direct Office Dial: [phone]   For scheduling needs, including cancellations and rescheduling, please call [place], [phone].

## 2020-09-23 ENCOUNTER — Other Ambulatory Visit: Payer: Self-pay

## 2020-09-23 ENCOUNTER — Other Ambulatory Visit: Payer: PPO | Admitting: *Deleted

## 2020-09-23 DIAGNOSIS — I208 Other forms of angina pectoris: Secondary | ICD-10-CM | POA: Diagnosis not present

## 2020-09-24 LAB — BASIC METABOLIC PANEL
BUN/Creatinine Ratio: 35 — ABNORMAL HIGH (ref 12–28)
BUN: 18 mg/dL (ref 8–27)
CO2: 25 mmol/L (ref 20–29)
Calcium: 9.4 mg/dL (ref 8.7–10.3)
Chloride: 103 mmol/L (ref 96–106)
Creatinine, Ser: 0.51 mg/dL — ABNORMAL LOW (ref 0.57–1.00)
GFR calc Af Amer: 116 mL/min/{1.73_m2} (ref >59)
GFR calc non Af Amer: 101 mL/min/{1.73_m2} (ref >59)
Glucose: 142 mg/dL — ABNORMAL HIGH (ref 65–99)
Potassium: 4.5 mmol/L (ref 3.5–5.2)
Sodium: 140 mmol/L (ref 134–144)

## 2020-09-28 ENCOUNTER — Telehealth (HOSPITAL_COMMUNITY): Payer: Self-pay | Admitting: Emergency Medicine

## 2020-09-28 NOTE — Telephone Encounter (Signed)
Returning phone call regarding upcoming cardiac imaging study; pt verbalizes understanding of appt date/time, parking situation and where to check in, pre-test NPO status and medications ordered, and verified current allergies; name and call back number provided for further questions should they arise Alyssa Bond RN Navigator Cardiac Imaging [facility] [phone] office [phone] cell   Pt to take 100mg  metoprolol tart 2h pta Jonathon Castelo

## 2020-09-28 NOTE — Telephone Encounter (Signed)
Attempted to call patient regarding upcoming cardiac CT appointment. °Left message on voicemail with name and callback number °Gerrard Crystal RN Navigator Cardiac Imaging °[facility] °[phone] Office °[phone] Cell ° °

## 2020-09-29 ENCOUNTER — Other Ambulatory Visit: Payer: Self-pay

## 2020-09-29 ENCOUNTER — Ambulatory Visit (HOSPITAL_COMMUNITY)
Admission: RE | Admit: 2020-09-29 | Discharge: 2020-09-29 | Disposition: A | Discharge status: Home / Self Care | Payer: PPO | Source: Ambulatory Visit | Attending: Cardiovascular Disease | Admitting: Cardiovascular Disease

## 2020-09-29 ENCOUNTER — Encounter: Payer: PPO | Admitting: *Deleted

## 2020-09-29 DIAGNOSIS — I208 Other forms of angina pectoris: Secondary | ICD-10-CM | POA: Diagnosis present

## 2020-09-29 DIAGNOSIS — R931 Abnormal findings on diagnostic imaging of heart and coronary circulation: Secondary | ICD-10-CM | POA: Insufficient documentation

## 2020-09-29 DIAGNOSIS — I2510 Atherosclerotic heart disease of native coronary artery without angina pectoris: Secondary | ICD-10-CM | POA: Diagnosis not present

## 2020-09-29 DIAGNOSIS — I25118 Atherosclerotic heart disease of native coronary artery with other forms of angina pectoris: Secondary | ICD-10-CM | POA: Insufficient documentation

## 2020-09-29 DIAGNOSIS — Z006 Encounter for examination for normal comparison and control in clinical research program: Secondary | ICD-10-CM

## 2020-09-29 MED ORDER — METOPROLOL TARTRATE 5 MG/5ML IV SOLN
5.0000 mg | INTRAVENOUS | Status: DC | PRN
  Administered 2020-09-29: 5 mg via INTRAVENOUS

## 2020-09-29 MED ORDER — NITROGLYCERIN 0.4 MG SL SUBL
SUBLINGUAL_TABLET | SUBLINGUAL | Status: AC
  Filled 2020-09-29: qty 2

## 2020-09-29 MED ORDER — METOPROLOL TARTRATE 5 MG/5ML IV SOLN
INTRAVENOUS | Status: AC
  Filled 2020-09-29: qty 5

## 2020-09-29 MED ORDER — NITROGLYCERIN 0.4 MG SL SUBL
0.8000 mg | SUBLINGUAL_TABLET | Freq: Once | SUBLINGUAL | Status: AC
  Administered 2020-09-29: 0.8 mg via SUBLINGUAL

## 2020-09-29 MED ORDER — IOHEXOL 350 MG/ML SOLN
80.0000 mL | Freq: Once | INTRAVENOUS | Status: AC | PRN
  Administered 2020-09-29: 80 mL via INTRAVENOUS

## 2020-09-29 NOTE — Research (Signed)
Subject Name: Alyssa Stone [facility]  Subject met inclusion and exclusion criteria.  The informed consent form, study requirements and expectations were reviewed with the subject and questions and concerns were addressed prior to the signing of the consent form.  The subject verbalized understanding of the trial requirements.  The subject agreed to participate in the IDENTIFY trial and signed the informed consent at 0734 on 09/29/20  The informed consent was obtained prior to performance of any protocol-specific procedures for the subject.  A copy of the signed informed consent was given to the subject and a copy was placed in the subject's medical record.   Alyssa Stone

## 2020-10-06 ENCOUNTER — Encounter: Payer: Self-pay | Admitting: Cardiovascular Disease

## 2020-10-06 NOTE — Progress Notes (Signed)
Cardiology Office Note:    Date:  10/07/2020   ID:  Alyssa Stone, DOB 08/15/1956, MRN [id number]  PCP:  Pleas Koch, NP  [facility] Cardiologist:  [facility] Electrophysiologist:  None   Referring MD: Pleas Koch, NP   Chief Complaint  Patient presents with  . Hyperlipidemia       . Coronary Artery Disease     Jan. 27, 2022   Alyssa Stone is a 65 y.o. female with a hx of  Sinus tachycardia. Hx of DM, HLD  + Fam hx of CAD  We were asked to see her today by Allie Bossier, NP for further evaluation of her sinus tachycardia.  EKGrevealed sinus tachycardia at a rate of 127.  She was started on metoprolol. Blood work revealed a normal TSH of 4.14.  Her white blood cell count was mildly elevated at 11.9.  Her basic metabolic profile was within normal limits with the exception of glucose of 102. Urinalysis revealed cloudy urine.  There was 3+ leukocytes and she was positive for protein.  Nitrates were negative.  Has DOE with climbing stairs.  Does not exercise Cleans housees, does yard work .  No chest pain .   Has occasional exertional chest pressure , has diabetes Had covid a year ago - has had covid twice feb and then thanksgiving  Has not felt well since having covid.    Knows to take vit. C, D, zinc Mother had cad / angina  - treated medically Father had CABG   Feb. 18, 2022: Alyssa Stone is seen today for follow up of her DOE , HLD. Seen with daughter, Alyssa Stone.   Coronary CT angio reveals a coronary calcium score of 0 but she has a non calcified proximal LAD stenosis. She is here to discuss cath. Has continued to have chest heaviness -typically occurs when she is outside working in the yard work walks upstairs. The pain resolves if she stops and rest. Will schedule for cath ASA 81 mg a day  NTG as needed.   Past Medical History:  Diagnosis Date  . Allergy   . Anemia   . Chicken pox   . Depression   . Diabetes mellitus without  complication ([place])   . Fainting episodes   . GERD (gastroesophageal reflux disease)   . Hyperlipidemia    controlled om meds     Past Surgical History:  Procedure Laterality Date  . ANAL FISSURE REPAIR    . CESAREAN SECTION    . TONSILLECTOMY Bilateral     Current Medications: Current Meds  Medication Sig  . aspirin EC 81 MG tablet Take 1 tablet (81 mg total) by mouth daily. Swallow whole.  Marland Kitchen atorvastatin (LIPITOR) 40 MG tablet TAKE 1 TABLET BY MOUTH EVERY EVENING FOR CHOLESTEROL (Patient taking differently: Take 40 mg by mouth at bedtime.)  . glipiZIDE (GLUCOTROL) 5 MG tablet Take 1 tablet (5 mg total) by mouth 2 (two) times daily before a meal. For diabetes.  . insulin glargine (LANTUS SOLOSTAR) 100 UNIT/ML Solostar Pen Inject 20 Units into the skin every evening. For diabetes (Patient taking differently: Inject 20 Units into the skin at bedtime. For diabetes)  . Insulin Pen Needle (B-D ULTRAFINE III SHORT PEN) 31G X 8 MM MISC USE NIGHTLY WITH INSULIN PEN FOR DIABETES.  . Lancets (ONETOUCH DELICA PLUS EUMPNT61W) MISC 1 each by Other route as directed.  . metFORMIN (GLUCOPHAGE XR) 500 MG 24 hr tablet Take 1 tablet (500 mg total)  by mouth 2 (two) times daily with a meal. For diabetes.  . metoprolol succinate (TOPROL-XL) 25 MG 24 hr tablet TAKE 1 TABLET BY MOUTH DAILY. FOR HEART RATE. (Patient taking differently: Take 25 mg by mouth at bedtime.)  . nitroGLYCERIN (NITROSTAT) 0.4 MG SL tablet Place 1 tablet (0.4 mg total) under the tongue every 5 (five) minutes as needed for chest pain.  Glory Rosebush VERIO test strip 1 each by Other route 4 (four) times daily.  . [DISCONTINUED] metoprolol tartrate (LOPRESSOR) 100 MG tablet Take 1 pill (100 mg) 2 hours before your Coronary CT  . [DISCONTINUED] sulfamethoxazole-trimethoprim (BACTRIM DS) 800-160 MG tablet Take 1 tablet by mouth 2 (two) times daily. For urinary tract infection. (Patient not taking: Reported on 10/07/2020)   Current  Facility-Administered Medications for the 10/07/20 encounter (Office Visit) with Lezlie Ritchey, Wonda Cheng, MD  Medication  . 0.9 %  sodium chloride infusion     Allergies:   Patient has no known allergies.   Social History   Socioeconomic History  . Marital status: Divorced    Spouse name: Not on file  . Number of children: 3  . Years of education: Not on file  . Highest education level: 12th grade  Occupational History  . Not on file  Tobacco Use  . Smoking status: Former Research scientist (life sciences)  . Smokeless tobacco: Never Used  Substance and Sexual Activity  . Alcohol use: Not Currently  . Drug use: Never  . Sexual activity: Not Currently  Other Topics Concern  . Not on file  Social History Narrative  . Not on file   Social Determinants of Health   Financial Resource Strain: Not on file  Food Insecurity: Not on file  Transportation Needs: Not on file  Physical Activity: Not on file  Stress: Not on file  Social Connections: Not on file     Family History: The patient's family history includes Cancer in her mother and sister; Diabetes in her brother, mother, and sister; Heart attack in her brother and father; Hyperlipidemia in her brother; Hypertension in her father; Kidney disease in her brother; Miscarriages / [place] in her mother. There is no history of Colon cancer, Colon polyps, Esophageal cancer, Rectal cancer, or Stomach cancer.  ROS:   Please see the history of present illness.     All other systems reviewed and are negative.  EKGs/Labs/Other Studies Reviewed:    The following studies were reviewed today:   EKG:    Feb. 18, 2022:  NSR at 83. , no ST or T wave changes.    Recent Labs: 08/10/2020: TSH 4.14 10/07/2020: ALT 14; BUN 12; Creatinine, Ser 0.51; Hemoglobin 13.2; Platelets 377; Potassium 4.8; Sodium 138  Recent Lipid Panel    Component Value Date/Time   CHOL 127 10/07/2020 1016   TRIG 113 10/07/2020 1016   HDL 42 10/07/2020 1016   CHOLHDL 3.0 10/07/2020 1016    CHOLHDL 4 10/15/2019 0955   VLDL 31.8 10/15/2019 0955   LDLCALC 64 10/07/2020 1016   LDLDIRECT 137.0 09/03/2019 1225     Risk Assessment/Calculations:       Physical Exam:    Physical Exam: Blood pressure 124/72, pulse 83, height 5' 7.25" (1.708 m), weight 212 lb (96.2 kg), SpO2 97 %.  GEN:  Well nourished, well developed in no acute distress HEENT: Normal NECK: No JVD; No carotid bruits LYMPHATICS: No lymphadenopathy CARDIAC: RRR , no murmurs, rubs, gallops RESPIRATORY:  Clear to auscultation without rales, wheezing or rhonchi  ABDOMEN: Soft, non-tender,  non-distended MUSCULOSKELETAL:  No edema; No deformity  SKIN: Warm and dry NEUROLOGIC:  Alert and oriented x 3   ASSESSMENT:    1. CAD in native artery    PLAN:      1. Sinus tach:    Better on Toprol   2.   Coronary artery disease: Angeleena  had coronary CT angiogram that revealed moderate to severe stenosis in the LAD and diagonal vessel.  She is having symptoms consistent with angina.  We will schedule her for heart catheterization.  We will start her on aspirin 81 mg a day and will also call in a prescription for sublingual nitroglycerin.  We discussed the risk, benefits, options of heart catheterization.  She understands and agrees to proceed.     Medication Adjustments/Labs and Tests Ordered: Current medicines are reviewed at length with the patient today.  Concerns regarding medicines are outlined above.  Orders Placed This Encounter  Procedures  . Basic metabolic panel  . CBC  . Lipid panel  . ALT  . EKG 12-Lead   Meds ordered this encounter  Medications  . aspirin EC 81 MG tablet    Sig: Take 1 tablet (81 mg total) by mouth daily. Swallow whole.    Dispense:  90 tablet    Refill:  3  . nitroGLYCERIN (NITROSTAT) 0.4 MG SL tablet    Sig: Place 1 tablet (0.4 mg total) under the tongue every 5 (five) minutes as needed for chest pain.    Dispense:  25 tablet    Refill:  3     Patient Instructions   Medication Instructions:  Your physician has recommended you make the following change in your medication:   START Aspirin 81mg  daily  Nitroglycerin 0.4mg  as needed  *If you need a refill on your cardiac medications before your next appointment, please call your pharmacy*   Lab Work: TODAY: BMET, CBC, Lipids, ALT  If you have labs (blood work) drawn today and your tests are completely normal, you will receive your results only by: Marland Kitchen MyChart Message (if you have MyChart) OR . A paper copy in the mail If you have any lab test that is abnormal or we need to change your treatment, we will call you to review the results.   Testing/Procedures: Your physician has requested that you have a cardiac catheterization. Cardiac catheterization is used to diagnose and/or treat various heart conditions. Doctors may recommend this procedure for a number of different reasons. The most common reason is to evaluate chest pain. Chest pain can be a symptom of coronary artery disease (CAD), and cardiac catheterization can show whether plaque is narrowing or blocking your heart's arteries. This procedure is also used to evaluate the valves, as well as measure the blood flow and oxygen levels in different parts of your heart. For further information please visit [email or web address]. Please follow instruction sheet, as given.     Follow-Up: At [facility], you and your health needs are our priority.  As part of our continuing mission to provide you with exceptional heart care, we have created designated Provider Care Teams.  These Care Teams include your primary Cardiologist (physician) and Advanced Practice Providers (APPs -  Physician Assistants and Nurse Practitioners) who all work together to provide you with the care you need, when you need it.  We recommend signing up for the patient portal called "MyChart".  Sign up information is provided on this After Visit Summary.  MyChart is used to connect with  patients for  Virtual Visits (Telemedicine).  Patients are able to view lab/test results, encounter notes, upcoming appointments, etc.  Non-urgent messages can be sent to your provider as well.   To learn more about what you can do with MyChart, go to [email or web address].    Your next appointment:  11/01/2020 at 8:45am 3 week(s)  The format for your next appointment:   In Person  Provider:   You may see Dr. Acie Fredrickson or one of the following Advanced Practice Providers on your designated Care Team:    Richardson Dopp, PA-C  Vin Duenweg, [place]    Other Instructions Due to recent COVID-19 restrictions implemented by our local and state authorities and in an effort to keep both patients and staff as safe as possible, our hospital system requires COVID-19 testing prior to certain scheduled hospital procedures.  Please go to [place]. [place], [place] [address] on 2/23 at 11:15am  .  This is a drive up testing site.  You will not need to exit your vehicle. You must agree to self-quarantine from the time of your testing until the procedure date on 2/25.  This should included staying home with ONLY the people you live with.  Avoid take-out, grocery store shopping or leaving the house for any non-emergent reason.  Failure to have your COVID-19 test done on the date and time you have been scheduled will result in cancellation of your procedure.  Please call our office at [phone] if you have any questions.     [place] OFFICE [place], [place] [address] Dept: [phone] Loc: [place]  10/07/2020  You are scheduled for a Cardiac Catheterization on Friday, February 25 with Dr. Larae Grooms.  1. Please arrive at the [facility] (Main Entrance A) at [facility]: [address] [place], [place] [address] at 5:30 AM (This time is two hours before your  procedure to ensure your preparation). Free valet parking service is available.   Special note: Every effort is made to have your procedure done on time. Please understand that emergencies sometimes delay scheduled procedures.  2. Diet: Do not eat solid foods after midnight.  The patient may have clear liquids until 5am upon the day of the procedure.  3. Labs: You will need to have blood drawn TODAY 4. Medication instructions in preparation for your procedure:   Contrast Allergy: No   Take only 10 units of insulin the night before your procedure. Do not take any insulin on the day of the procedure.  Do not take Diabetes Med Glucophage (Metformin) on the day of the procedure and HOLD 48 HOURS AFTER THE PROCEDURE.   DO NOT take Glipizide the morning of your procedure.  On the morning of your procedure, take your Aspirin and any morning medicines NOT listed above.  You may use sips of water.  5. Plan for one night stay--bring personal belongings. 6. Bring a current list of your medications and current insurance cards. 7. You MUST have a responsible person to drive you home. 8. Someone MUST be with you the first 24 hours after you arrive home or your discharge will be delayed. 9. Please wear clothes that are easy to get on and off and wear slip-on shoes.  Thank you for allowing [place] to care for you!   -- [facility] Invasive Cardiovascular services       Signed, Mertie Moores, MD  10/07/2020 6:23  PM    [facility]

## 2020-10-06 NOTE — H&P (View-Only) (Signed)
Cardiology Office Note:    Date:  10/07/2020   ID:  Alyssa Stone, DOB 1956-06-05, MRN [id number]  PCP:  Pleas Koch, NP  [facility] Cardiologist:  [facility] Klippel [facility] Electrophysiologist:  None   Referring MD: Pleas Koch, NP   Chief Complaint  Patient presents with  . Hyperlipidemia       . Coronary Artery Disease     Jan. 27, 2022   Alyssa Stone is a 65 y.o. female with a hx of  Sinus tachycardia. Hx of DM, HLD  + Fam hx of CAD  We were asked to see her today by Allie Bossier, NP for further evaluation of her sinus tachycardia.  EKGrevealed sinus tachycardia at a rate of 127.  She was started on metoprolol. Blood work revealed a normal TSH of 4.14.  Her white blood cell count was mildly elevated at 11.9.  Her basic metabolic profile was within normal limits with the exception of glucose of 102. Urinalysis revealed cloudy urine.  There was 3+ leukocytes and she was positive for protein.  Nitrates were negative.  Has DOE with climbing stairs.  Does not exercise Cleans housees, does yard work .  No chest pain .   Has occasional exertional chest pressure , has diabetes Had covid a year ago - has had covid twice feb and then thanksgiving  Has not felt well since having covid.    Knows to take vit. C, D, zinc Mother had cad / angina  - treated medically Father had CABG   Feb. 18, 2022: [name] is seen today for follow up of her DOE , HLD. Seen with daughter, heather.   Coronary CT angio reveals a coronary calcium score of 0 but she has a non calcified proximal LAD stenosis. She is here to discuss cath. Has continued to have chest heaviness -typically occurs when she is outside working in the yard work walks upstairs. The pain resolves if she stops and rest. Will schedule for cath ASA 81 mg a day  NTG as needed.   Past Medical History:  Diagnosis Date  . Allergy   . Anemia   . Chicken pox   . Depression   . Diabetes mellitus without  complication ([place])   . Fainting episodes   . GERD (gastroesophageal reflux disease)   . Hyperlipidemia    controlled om meds     Past Surgical History:  Procedure Laterality Date  . ANAL FISSURE REPAIR    . CESAREAN SECTION    . TONSILLECTOMY Bilateral     Current Medications: Current Meds  Medication Sig  . aspirin EC 81 MG tablet Take 1 tablet (81 mg total) by mouth daily. Swallow whole.  Marland Kitchen atorvastatin (LIPITOR) 40 MG tablet TAKE 1 TABLET BY MOUTH EVERY EVENING FOR CHOLESTEROL (Patient taking differently: Take 40 mg by mouth at bedtime.)  . glipiZIDE (GLUCOTROL) 5 MG tablet Take 1 tablet (5 mg total) by mouth 2 (two) times daily before a meal. For diabetes.  . insulin glargine (LANTUS SOLOSTAR) 100 UNIT/ML Solostar Pen Inject 20 Units into the skin every evening. For diabetes (Patient taking differently: Inject 20 Units into the skin at bedtime. For diabetes)  . Insulin Pen Needle (B-D ULTRAFINE III SHORT PEN) 31G X 8 MM MISC USE NIGHTLY WITH INSULIN PEN FOR DIABETES.  . Lancets (ONETOUCH DELICA PLUS [id number]) MISC 1 each by Other route as directed.  . metFORMIN (GLUCOPHAGE XR) 500 MG 24 hr tablet Take 1 tablet (500 mg total)  by mouth 2 (two) times daily with a meal. For diabetes.  . metoprolol succinate (TOPROL-XL) 25 MG 24 hr tablet TAKE 1 TABLET BY MOUTH DAILY. FOR HEART RATE. (Patient taking differently: Take 25 mg by mouth at bedtime.)  . nitroGLYCERIN (NITROSTAT) 0.4 MG SL tablet Place 1 tablet (0.4 mg total) under the tongue every 5 (five) minutes as needed for chest pain.  Glory Rosebush VERIO test strip 1 each by Other route 4 (four) times daily.  . [DISCONTINUED] metoprolol tartrate (LOPRESSOR) 100 MG tablet Take 1 pill (100 mg) 2 hours before your Coronary CT  . [DISCONTINUED] sulfamethoxazole-trimethoprim (BACTRIM DS) 800-160 MG tablet Take 1 tablet by mouth 2 (two) times daily. For urinary tract infection. (Patient not taking: Reported on 10/07/2020)   Current  Facility-Administered Medications for the 10/07/20 encounter (Office Visit) with [place] Leinen, Wonda Cheng, MD  Medication  . 0.9 %  sodium chloride infusion     Allergies:   Patient has no known allergies.   Social History   Socioeconomic History  . Marital status: Divorced    Spouse name: Not on file  . Number of children: 3  . Years of education: Not on file  . Highest education level: 12th grade  Occupational History  . Not on file  Tobacco Use  . Smoking status: Former Research scientist (life sciences)  . Smokeless tobacco: Never Used  Substance and Sexual Activity  . Alcohol use: Not Currently  . Drug use: Never  . Sexual activity: Not Currently  Other Topics Concern  . Not on file  Social History Narrative  . Not on file   Social Determinants of Health   Financial Resource Strain: Not on file  Food Insecurity: Not on file  Transportation Needs: Not on file  Physical Activity: Not on file  Stress: Not on file  Social Connections: Not on file     Family History: The patient's family history includes Cancer in her mother and sister; Diabetes in her brother, mother, and sister; Heart attack in her brother and father; Hyperlipidemia in her brother; Hypertension in her father; Kidney disease in her brother; Miscarriages / [place] in her mother. There is no history of Colon cancer, Colon polyps, Esophageal cancer, Rectal cancer, or Stomach cancer.  ROS:   Please see the history of present illness.     All other systems reviewed and are negative.  EKGs/Labs/Other Studies Reviewed:    The following studies were reviewed today:   EKG:    Feb. 18, 2022:  NSR at 83. , no ST or T wave changes.    Recent Labs: 08/10/2020: TSH 4.14 10/07/2020: ALT 14; BUN 12; Creatinine, Ser 0.51; Hemoglobin 13.2; Platelets 377; Potassium 4.8; Sodium 138  Recent Lipid Panel    Component Value Date/Time   CHOL 127 10/07/2020 1016   TRIG 113 10/07/2020 1016   HDL 42 10/07/2020 1016   CHOLHDL 3.0 10/07/2020 1016    CHOLHDL 4 10/15/2019 0955   VLDL 31.8 10/15/2019 0955   LDLCALC 64 10/07/2020 1016   LDLDIRECT 137.0 09/03/2019 1225     Risk Assessment/Calculations:       Physical Exam:    Physical Exam: Blood pressure 124/72, pulse 83, height 5' 7.25" (1.708 m), weight 212 lb (96.2 kg), SpO2 97 %.  GEN:  Well nourished, well developed in no acute distress HEENT: Normal NECK: No JVD; No carotid bruits LYMPHATICS: No lymphadenopathy CARDIAC: RRR , no murmurs, rubs, gallops RESPIRATORY:  Clear to auscultation without rales, wheezing or rhonchi  ABDOMEN: Soft, non-tender,  non-distended MUSCULOSKELETAL:  No edema; No deformity  SKIN: Warm and dry NEUROLOGIC:  Alert and oriented x 3   ASSESSMENT:    1. CAD in native artery    PLAN:      1. Sinus tach:    Better on Toprol   2.   Coronary artery disease: [place]  had coronary CT angiogram that revealed moderate to severe stenosis in the LAD and diagonal vessel.  She is having symptoms consistent with angina.  We will schedule her for heart catheterization.  We will start her on aspirin 81 mg a day and will also call in a prescription for sublingual nitroglycerin.  We discussed the risk, benefits, options of heart catheterization.  She understands and agrees to proceed.     Medication Adjustments/Labs and Tests Ordered: Current medicines are reviewed at length with the patient today.  Concerns regarding medicines are outlined above.  Orders Placed This Encounter  Procedures  . Basic metabolic panel  . CBC  . Lipid panel  . ALT  . EKG 12-Lead   Meds ordered this encounter  Medications  . aspirin EC 81 MG tablet    Sig: Take 1 tablet (81 mg total) by mouth daily. Swallow whole.    Dispense:  90 tablet    Refill:  3  . nitroGLYCERIN (NITROSTAT) 0.4 MG SL tablet    Sig: Place 1 tablet (0.4 mg total) under the tongue every 5 (five) minutes as needed for chest pain.    Dispense:  25 tablet    Refill:  3     Patient Instructions   Medication Instructions:  Your physician has recommended you make the following change in your medication:   START Aspirin 81mg  daily  Nitroglycerin 0.4mg  as needed  *If you need a refill on your cardiac medications before your next appointment, please call your pharmacy*   Lab Work: TODAY: BMET, CBC, Lipids, ALT  If you have labs (blood work) drawn today and your tests are completely normal, you will receive your results only by: Marland Kitchen MyChart Message (if you have MyChart) OR . A paper copy in the mail If you have any lab test that is abnormal or we need to change your treatment, we will call you to review the results.   Testing/Procedures: Your physician has requested that you have a cardiac catheterization. Cardiac catheterization is used to diagnose and/or treat various heart conditions. Doctors may recommend this procedure for a number of different reasons. The most common reason is to evaluate chest pain. Chest pain can be a symptom of coronary artery disease (CAD), and cardiac catheterization can show whether plaque is narrowing or blocking your heart's arteries. This procedure is also used to evaluate the valves, as well as measure the blood flow and oxygen levels in different parts of your heart. For further information please visit [email or web address].[email or web address]. Please follow instruction sheet, as given.     Follow-Up: At [facility], you and your health needs are our priority.  As part of our continuing mission to provide you with exceptional heart care, we have created designated Provider Care Teams.  These Care Teams include your primary Cardiologist (physician) and Advanced Practice Providers (APPs -  Physician Assistants and Nurse Practitioners) who all work together to provide you with the care you need, when you need it.  We recommend signing up for the patient portal called "MyChart".  Sign up information is provided on this After Visit Summary.  MyChart is used to connect with  patients for  Virtual Visits (Telemedicine).  Patients are able to view lab/test results, encounter notes, upcoming appointments, etc.  Non-urgent messages can be sent to your provider as well.   To learn more about what you can do with MyChart, go to [email or web address].    Your next appointment:  11/01/2020 at 8:45am 3 week(s)  The format for your next appointment:   In Person  Provider:   You may see Dr. Acie Fredrickson or one of the following Advanced Practice Providers on your designated Care Team:    Richardson Dopp, PA-C  [place]in [place], [place]    Other Instructions Due to recent COVID-19 restrictions implemented by our local and state authorities and in an effort to keep both patients and staff as safe as possible, our hospital system requires COVID-19 testing prior to certain scheduled hospital procedures.  Please go to [address]. [place], [place] [address] on 2/23 at 11:15am  .  This is a drive up testing site.  You will not need to exit your vehicle. You must agree to self-quarantine from the time of your testing until the procedure date on 2/25.  This should included staying home with ONLY the people you live with.  Avoid take-out, grocery store shopping or leaving the house for any non-emergent reason.  Failure to have your COVID-19 test done on the date and time you have been scheduled will result in cancellation of your procedure.  Please call our office at [phone] if you have any questions.     [place] OFFICE [place], [place] [address] Dept: [phone] Loc: [place]  10/07/2020  You are scheduled for a Cardiac Catheterization on Friday, February 25 with Dr. Larae Grooms.  1. Please arrive at the [facility] (Main Entrance A) at [facility]: [address] [place], [place] [address] at 5:30 AM (This time is two hours before your  procedure to ensure your preparation). Free valet parking service is available.   Special note: Every effort is made to have your procedure done on time. Please understand that emergencies sometimes delay scheduled procedures.  2. Diet: Do not eat solid foods after midnight.  The patient may have clear liquids until 5am upon the day of the procedure.  3. Labs: You will need to have blood drawn TODAY 4. Medication instructions in preparation for your procedure:   Contrast Allergy: No   Take only 10 units of insulin the night before your procedure. Do not take any insulin on the day of the procedure.  Do not take Diabetes Med Glucophage (Metformin) on the day of the procedure and HOLD 48 HOURS AFTER THE PROCEDURE.   DO NOT take Glipizide the morning of your procedure.  On the morning of your procedure, take your Aspirin and any morning medicines NOT listed above.  You may use sips of water.  5. Plan for one night stay--bring personal belongings. 6. Bring a current list of your medications and current insurance cards. 7. You MUST have a responsible person to drive you home. 8. Someone MUST be with you the first 24 hours after you arrive home or your discharge will be delayed. 9. Please wear clothes that are easy to get on and off and wear slip-on shoes.  Thank you for allowing [place] to care for you!   -- [facility] Invasive Cardiovascular services       Signed, Mertie Moores, MD  10/07/2020 6:23  PM    [facility]

## 2020-10-07 ENCOUNTER — Other Ambulatory Visit: Payer: Self-pay

## 2020-10-07 ENCOUNTER — Encounter: Payer: Self-pay | Admitting: Cardiovascular Disease

## 2020-10-07 ENCOUNTER — Ambulatory Visit: Payer: PPO | Admitting: Cardiovascular Disease

## 2020-10-07 VITALS — BP 124/72 | HR 83 | Ht 67.25 in | Wt 212.0 lb

## 2020-10-07 DIAGNOSIS — I2510 Atherosclerotic heart disease of native coronary artery without angina pectoris: Secondary | ICD-10-CM

## 2020-10-07 LAB — BASIC METABOLIC PANEL
BUN/Creatinine Ratio: 24 (ref 12–28)
BUN: 12 mg/dL (ref 8–27)
CO2: 24 mmol/L (ref 20–29)
Calcium: 9.6 mg/dL (ref 8.7–10.3)
Chloride: 99 mmol/L (ref 96–106)
Creatinine, Ser: 0.51 mg/dL — ABNORMAL LOW (ref 0.57–1.00)
GFR calc Af Amer: 116 mL/min/{1.73_m2} (ref >59)
GFR calc non Af Amer: 101 mL/min/{1.73_m2} (ref >59)
Glucose: 131 mg/dL — ABNORMAL HIGH (ref 65–99)
Potassium: 4.8 mmol/L (ref 3.5–5.2)
Sodium: 138 mmol/L (ref 134–144)

## 2020-10-07 LAB — CBC
Hematocrit: 38.8 % (ref 34.0–46.6)
Hemoglobin: 13.2 g/dL (ref 11.1–15.9)
MCH: 29.8 pg (ref 26.6–33.0)
MCHC: 34 g/dL (ref 31.5–35.7)
MCV: 88 fL (ref 79–97)
Platelets: 377 10*3/uL (ref 150–450)
RBC: 4.43 x10E6/uL (ref 3.77–5.28)
RDW: 12.4 % (ref 11.7–15.4)
WBC: 10.7 10*3/uL (ref 3.4–10.8)

## 2020-10-07 LAB — LIPID PANEL
Chol/HDL Ratio: 3 ratio (ref 0.0–4.4)
Cholesterol, Total: 127 mg/dL (ref 100–199)
HDL: 42 mg/dL (ref >39)
LDL Chol Calc (NIH): 64 mg/dL (ref 0–99)
Triglycerides: 113 mg/dL (ref 0–149)
VLDL Cholesterol Cal: 21 mg/dL (ref 5–40)

## 2020-10-07 LAB — ALT: ALT: 14 IU/L (ref 0–32)

## 2020-10-07 MED ORDER — NITROGLYCERIN 0.4 MG SL SUBL: 0.4000 mg | SUBLINGUAL_TABLET | SUBLINGUAL | 3 refills | Status: DC | PRN

## 2020-10-07 MED ORDER — ASPIRIN EC 81 MG PO TBEC: 81.0000 mg | DELAYED_RELEASE_TABLET | Freq: Every day | ORAL | 3 refills | Status: DC

## 2020-10-07 NOTE — Patient Instructions (Addendum)
Medication Instructions:  Your physician has recommended you make the following change in your medication:   START Aspirin 81mg  daily  Nitroglycerin 0.4mg  as needed  *If you need a refill on your cardiac medications before your next appointment, please call your pharmacy*   Lab Work: TODAY: BMET, CBC, Lipids, ALT  If you have labs (blood work) drawn today and your tests are completely normal, you will receive your results only by: Marland Kitchen MyChart Message (if you have MyChart) OR . A paper copy in the mail If you have any lab test that is abnormal or we need to change your treatment, we will call you to review the results.   Testing/Procedures: Your physician has requested that you have a cardiac catheterization. Cardiac catheterization is used to diagnose and/or treat various heart conditions. Doctors may recommend this procedure for a number of different reasons. The most common reason is to evaluate chest pain. Chest pain can be a symptom of coronary artery disease (CAD), and cardiac catheterization can show whether plaque is narrowing or blocking your heart's arteries. This procedure is also used to evaluate the valves, as well as measure the blood flow and oxygen levels in different parts of your heart. For further information please visit HugeFiesta.[email or web address]. Please follow instruction sheet, as given.     Follow-Up: At [facility], you and your health needs are our priority.  As part of our continuing mission to provide you with exceptional heart care, we have created designated Provider Care Teams.  These Care Teams include your primary Cardiologist (physician) and Advanced Practice Providers (APPs -  Physician Assistants and Nurse Practitioners) who all work together to provide you with the care you need, when you need it.  We recommend signing up for the patient portal called "MyChart".  Sign up information is provided on this After Visit Summary.  MyChart is used to connect with  patients for Virtual Visits (Telemedicine).  Patients are able to view lab/test results, encounter notes, upcoming appointments, etc.  Non-urgent messages can be sent to your provider as well.   To learn more about what you can do with MyChart, go to [email or web address].    Your next appointment:  11/01/2020 at 8:45am 3 week(s)  The format for your next appointment:   In Person  Provider:   You may see Dr. Acie Fredrickson or one of the following Advanced Practice Providers on your designated Care Team:    Richardson Dopp, PA-C  Vin [place], [place]    Other Instructions Due to recent COVID-19 restrictions implemented by our local and state authorities and in an effort to keep both patients and staff as safe as possible, our hospital system requires COVID-19 testing prior to certain scheduled hospital procedures.  Please go to [address]. [place], [place] [address] on 2/23 at 11:15am  .  This is a drive up testing site.  You will not need to exit your vehicle. You must agree to self-quarantine from the time of your testing until the procedure date on 2/25.  This should included staying home with ONLY the people you live with.  Avoid take-out, grocery store shopping or leaving the house for any non-emergent reason.  Failure to have your COVID-19 test done on the date and time you have been scheduled will result in cancellation of your procedure.  Please call our office at [phone] if you have any questions.     [place] OFFICE [place],  [address]  [place] [address] Dept: [phone] Loc: [place]  10/07/2020  You are scheduled for a Cardiac Catheterization on Friday, February 25 with Dr. Larae Grooms.  1. Please arrive at the [facility] (Main Entrance A) at [facility]: [address] [place], [place] [address] at 5:30 AM (This time is two hours before your  procedure to ensure your preparation). Free valet parking service is available.   Special note: Every effort is made to have your procedure done on time. Please understand that emergencies sometimes delay scheduled procedures.  2. Diet: Do not eat solid foods after midnight.  The patient may have clear liquids until 5am upon the day of the procedure.  3. Labs: You will need to have blood drawn TODAY 4. Medication instructions in preparation for your procedure:   Contrast Allergy: No   Take only 10 units of insulin the night before your procedure. Do not take any insulin on the day of the procedure.  Do not take Diabetes Med Glucophage (Metformin) on the day of the procedure and HOLD 48 HOURS AFTER THE PROCEDURE.   DO NOT take Glipizide the morning of your procedure.  On the morning of your procedure, take your Aspirin and any morning medicines NOT listed above.  You may use sips of water.  5. Plan for one night stay--bring personal belongings. 6. Bring a current list of your medications and current insurance cards. 7. You MUST have a responsible person to drive you home. 8. Someone MUST be with you the first 24 hours after you arrive home or your discharge will be delayed. 9. Please wear clothes that are easy to get on and off and wear slip-on shoes.  Thank you for allowing [place] to care for you!   -- [facility] In[facility] Cardiovascular services

## 2020-10-12 ENCOUNTER — Other Ambulatory Visit (HOSPITAL_COMMUNITY)
Admission: RE | Admit: 2020-10-12 | Discharge: 2020-10-12 | Disposition: A | Discharge status: Home / Self Care | Payer: PPO | Source: Ambulatory Visit | Attending: Interventional Cardiology | Admitting: Interventional Cardiology

## 2020-10-12 ENCOUNTER — Other Ambulatory Visit (HOSPITAL_COMMUNITY): Payer: PPO

## 2020-10-12 DIAGNOSIS — Z20822 Contact with and (suspected) exposure to covid-19: Secondary | ICD-10-CM | POA: Insufficient documentation

## 2020-10-12 DIAGNOSIS — Z01812 Encounter for preprocedural laboratory examination: Secondary | ICD-10-CM | POA: Insufficient documentation

## 2020-10-12 LAB — SARS CORONAVIRUS 2 (TAT 6-24 HRS): SARS Coronavirus 2: NEGATIVE

## 2020-10-13 ENCOUNTER — Telehealth: Payer: Self-pay | Admitting: *Deleted

## 2020-10-13 ENCOUNTER — Telehealth: Payer: Self-pay | Admitting: Interventional Cardiology

## 2020-10-13 NOTE — Telephone Encounter (Signed)
Pt contacted pre-catheterization scheduled at [facility] for: Friday October 14, 2020 7:30 AM Verified arrival time and place: [facility]) at: 5:30 AM   No solid food after midnight prior to cath, clear liquids until 5 AM day of procedure.  Hold: Metformin-day of procedure and 48 hours post procedure Glipizide-AM of procedure Insulin-1/2 usual Insulin dose HS prior to procedure  Except hold medications AM meds can be  taken pre-cath with sips of water including: ASA 81 mg   Confirmed patient has responsible adult to drive home post procedure and be with patient first 24 hours after arriving home:  You are allowed ONE visitor in the waiting room during the time you are at the hospital for your procedure. Both you and your visitor must wear a mask once you enter the hospital.  Reviewed procedure/mask/visitor instructions with patient.

## 2020-10-13 NOTE — Telephone Encounter (Signed)
I called Alyssa Stone to change her arrival and procedure time. I ask patient to please arrive at 9 am for a 11am case with Dr. Irish Lack, all other instructions the same. Pt verbalized understanding of the change.

## 2020-10-14 ENCOUNTER — Other Ambulatory Visit: Payer: Self-pay | Admitting: Interventional Cardiology

## 2020-10-14 ENCOUNTER — Encounter (HOSPITAL_COMMUNITY)
Admission: RE | Disposition: A | Discharge status: Home / Self Care | Payer: Self-pay | Source: Home / Self Care | Attending: Interventional Cardiology

## 2020-10-14 ENCOUNTER — Ambulatory Visit (HOSPITAL_COMMUNITY)
Admission: RE | Admit: 2020-10-14 | Discharge: 2020-10-14 | Disposition: A | Discharge status: Home / Self Care | Payer: PPO | Attending: Interventional Cardiology | Admitting: Interventional Cardiology

## 2020-10-14 DIAGNOSIS — Z1211 Encounter for screening for malignant neoplasm of colon: Secondary | ICD-10-CM

## 2020-10-14 DIAGNOSIS — Z955 Presence of coronary angioplasty implant and graft: Secondary | ICD-10-CM

## 2020-10-14 DIAGNOSIS — K219 Gastro-esophageal reflux disease without esophagitis: Secondary | ICD-10-CM | POA: Insufficient documentation

## 2020-10-14 DIAGNOSIS — E785 Hyperlipidemia, unspecified: Secondary | ICD-10-CM | POA: Diagnosis not present

## 2020-10-14 DIAGNOSIS — Z8619 Personal history of other infectious and parasitic diseases: Secondary | ICD-10-CM | POA: Diagnosis not present

## 2020-10-14 DIAGNOSIS — Z87891 Personal history of nicotine dependence: Secondary | ICD-10-CM | POA: Insufficient documentation

## 2020-10-14 DIAGNOSIS — I25118 Atherosclerotic heart disease of native coronary artery with other forms of angina pectoris: Secondary | ICD-10-CM | POA: Diagnosis not present

## 2020-10-14 DIAGNOSIS — Z79899 Other long term (current) drug therapy: Secondary | ICD-10-CM | POA: Insufficient documentation

## 2020-10-14 DIAGNOSIS — Z7982 Long term (current) use of aspirin: Secondary | ICD-10-CM | POA: Diagnosis not present

## 2020-10-14 DIAGNOSIS — Z794 Long term (current) use of insulin: Secondary | ICD-10-CM | POA: Diagnosis not present

## 2020-10-14 DIAGNOSIS — I2510 Atherosclerotic heart disease of native coronary artery without angina pectoris: Secondary | ICD-10-CM

## 2020-10-14 DIAGNOSIS — R000 Tachycardia, unspecified: Secondary | ICD-10-CM | POA: Diagnosis present

## 2020-10-14 DIAGNOSIS — Z8249 Family history of ischemic heart disease and other diseases of the circulatory system: Secondary | ICD-10-CM | POA: Insufficient documentation

## 2020-10-14 DIAGNOSIS — E119 Type 2 diabetes mellitus without complications: Secondary | ICD-10-CM | POA: Diagnosis not present

## 2020-10-14 DIAGNOSIS — E1140 Type 2 diabetes mellitus with diabetic neuropathy, unspecified: Secondary | ICD-10-CM

## 2020-10-14 DIAGNOSIS — D649 Anemia, unspecified: Secondary | ICD-10-CM | POA: Diagnosis present

## 2020-10-14 HISTORY — PX: CORONARY PRESSURE/FFR STUDY: CATH118243

## 2020-10-14 HISTORY — PX: CORONARY STENT INTERVENTION: CATH118234

## 2020-10-14 HISTORY — PX: LEFT HEART CATH AND CORONARY ANGIOGRAPHY: CATH118249

## 2020-10-14 HISTORY — PX: CORONARY IMAGING/OCT: CATH118326

## 2020-10-14 LAB — POCT ACTIVATED CLOTTING TIME
Activated Clotting Time: 285 seconds
Activated Clotting Time: 279 seconds

## 2020-10-14 LAB — GLUCOSE, CAPILLARY: Glucose-Capillary: 132 mg/dL — ABNORMAL HIGH (ref 70–99)

## 2020-10-14 SURGERY — LEFT HEART CATH AND CORONARY ANGIOGRAPHY
Anesthesia: LOCAL

## 2020-10-14 MED ORDER — SODIUM CHLORIDE 0.9% FLUSH: 3.0000 mL | Freq: Two times a day (BID) | INTRAVENOUS | Status: DC

## 2020-10-14 MED ORDER — HEPARIN SODIUM (PORCINE) 1000 UNIT/ML IJ SOLN
INTRAMUSCULAR | Status: AC
  Filled 2020-10-14: qty 1

## 2020-10-14 MED ORDER — SODIUM CHLORIDE 0.9 % IV SOLN: INTRAVENOUS | Status: AC

## 2020-10-14 MED ORDER — LIDOCAINE HCL (PF) 1 % IJ SOLN
INTRAMUSCULAR | Status: DC | PRN
  Administered 2020-10-14: 2 mL

## 2020-10-14 MED ORDER — MIDAZOLAM HCL 2 MG/2ML IJ SOLN
INTRAMUSCULAR | Status: AC
  Filled 2020-10-14: qty 2

## 2020-10-14 MED ORDER — LIDOCAINE HCL (PF) 1 % IJ SOLN
INTRAMUSCULAR | Status: AC
  Filled 2020-10-14: qty 30

## 2020-10-14 MED ORDER — SODIUM CHLORIDE 0.9 % IV SOLN: 250.0000 mL | INTRAVENOUS | Status: DC | PRN

## 2020-10-14 MED ORDER — ONDANSETRON HCL 4 MG/2ML IJ SOLN: 4.0000 mg | Freq: Four times a day (QID) | INTRAMUSCULAR | Status: DC | PRN

## 2020-10-14 MED ORDER — HEPARIN (PORCINE) IN NACL 1000-0.9 UT/500ML-% IV SOLN
INTRAVENOUS | Status: DC | PRN
  Administered 2020-10-14 (×2): 500 mL

## 2020-10-14 MED ORDER — IOHEXOL 350 MG/ML SOLN
INTRAVENOUS | Status: DC | PRN
  Administered 2020-10-14: 110 mL

## 2020-10-14 MED ORDER — METFORMIN HCL ER 500 MG PO TB24: 500.0000 mg | ORAL_TABLET | Freq: Two times a day (BID) | ORAL | 3 refills | Status: DC

## 2020-10-14 MED ORDER — SODIUM CHLORIDE 0.9 % WEIGHT BASED INFUSION
3.0000 mL/kg/h | INTRAVENOUS | Status: AC
  Administered 2020-10-14: 3 mL/kg/h via INTRAVENOUS

## 2020-10-14 MED ORDER — CLOPIDOGREL BISULFATE 75 MG PO TABS: 75.0000 mg | ORAL_TABLET | Freq: Every day | ORAL | 3 refills | Status: AC

## 2020-10-14 MED ORDER — SODIUM CHLORIDE 0.9% FLUSH: 3.0000 mL | INTRAVENOUS | Status: DC | PRN

## 2020-10-14 MED ORDER — ACETAMINOPHEN 325 MG PO TABS: 650.0000 mg | ORAL_TABLET | ORAL | Status: DC | PRN

## 2020-10-14 MED ORDER — FENTANYL CITRATE (PF) 100 MCG/2ML IJ SOLN
INTRAMUSCULAR | Status: AC
  Filled 2020-10-14: qty 2

## 2020-10-14 MED ORDER — ASPIRIN 81 MG PO CHEW: 81.0000 mg | CHEWABLE_TABLET | ORAL | Status: DC

## 2020-10-14 MED ORDER — CLOPIDOGREL BISULFATE 300 MG PO TABS
ORAL_TABLET | ORAL | Status: AC
  Filled 2020-10-14: qty 2

## 2020-10-14 MED ORDER — CLOPIDOGREL BISULFATE 75 MG PO TABS: 75.0000 mg | ORAL_TABLET | Freq: Every day | ORAL | 3 refills | Status: DC

## 2020-10-14 MED ORDER — CLOPIDOGREL BISULFATE 300 MG PO TABS
ORAL_TABLET | ORAL | Status: DC | PRN
  Administered 2020-10-14: 600 mg via ORAL

## 2020-10-14 MED ORDER — ASPIRIN EC 81 MG PO TBEC: 81.0000 mg | DELAYED_RELEASE_TABLET | Freq: Every day | ORAL | 3 refills | Status: DC

## 2020-10-14 MED ORDER — MIDAZOLAM HCL 2 MG/2ML IJ SOLN
INTRAMUSCULAR | Status: DC | PRN
  Administered 2020-10-14: 1 mg via INTRAVENOUS
  Administered 2020-10-14: 2 mg via INTRAVENOUS

## 2020-10-14 MED ORDER — VERAPAMIL HCL 2.5 MG/ML IV SOLN
INTRAVENOUS | Status: AC
  Filled 2020-10-14: qty 2

## 2020-10-14 MED ORDER — SODIUM CHLORIDE 0.9 % WEIGHT BASED INFUSION: 1.0000 mL/kg/h | INTRAVENOUS | Status: DC

## 2020-10-14 MED ORDER — HEPARIN SODIUM (PORCINE) 1000 UNIT/ML IJ SOLN
INTRAMUSCULAR | Status: DC | PRN
  Administered 2020-10-14: 3000 [IU] via INTRAVENOUS
  Administered 2020-10-14 (×2): 5000 [IU] via INTRAVENOUS

## 2020-10-14 MED ORDER — FENTANYL CITRATE (PF) 100 MCG/2ML IJ SOLN
INTRAMUSCULAR | Status: DC | PRN
  Administered 2020-10-14 (×2): 25 ug via INTRAVENOUS

## 2020-10-14 MED ORDER — VERAPAMIL HCL 2.5 MG/ML IV SOLN
INTRAVENOUS | Status: DC | PRN
  Administered 2020-10-14: 10 mL via INTRA_ARTERIAL

## 2020-10-14 MED ORDER — HEPARIN (PORCINE) IN NACL 1000-0.9 UT/500ML-% IV SOLN
INTRAVENOUS | Status: AC
  Filled 2020-10-14: qty 1000

## 2020-10-14 MED FILL — CLOPIDOGREL 75 MG TABLET: 75 | 30 days supply | Qty: 30 | Fill #0

## 2020-10-14 SURGICAL SUPPLY — 19 items
BAG SNAP BAND KOVER 36X36 (MISCELLANEOUS) ×2 IMPLANT
BALLN SAPPHIRE ~~LOC~~ 4.0X15 (BALLOONS) ×1 IMPLANT
CATH 5FR JL3.5 JR4 ANG PIG MP (CATHETERS) ×2 IMPLANT
CATH DRAGONFLY OPSTAR (CATHETERS) ×1 IMPLANT
CATH LAUNCHER 6FR EBU3.5 (CATHETERS) ×1 IMPLANT
COVER DOME SNAP 22 D (MISCELLANEOUS) ×2 IMPLANT
DEVICE RAD COMP TR BAND LRG (VASCULAR PRODUCTS) ×1 IMPLANT
GLIDESHEATH SLEND SS 6F .021 (SHEATH) ×1 IMPLANT
GUIDEWIRE INQWIRE 1.5J.035X260 (WIRE) ×1 IMPLANT
GUIDEWIRE PRESSURE X 175 (WIRE) ×1 IMPLANT
INQWIRE 1.5J .035X260CM (WIRE) ×2
KIT ENCORE 26 ADVANTAGE (KITS) ×1 IMPLANT
KIT HEART LEFT (KITS) ×2 IMPLANT
KIT HEMO VALVE WATCHDOG (MISCELLANEOUS) ×2 IMPLANT
PACK CARDIAC CATHETERIZATION (CUSTOM PROCEDURE TRAY) ×2 IMPLANT
SHEATH PROBE COVER 6X72 (BAG) ×2 IMPLANT
STENT RESOLUTE ONYX 3.5X26 (Permanent Stent) ×1 IMPLANT
TRANSDUCER W/STOPCOCK (MISCELLANEOUS) ×2 IMPLANT
TUBING CIL FLEX 10 FLL-RA (TUBING) ×2 IMPLANT

## 2020-10-14 NOTE — Discharge Instructions (Signed)
No driving for 3 days. No lifting over 5 lbs for 1 week. No sexual activity for 1 week. Keep procedure site clean & dry. If you notice increased pain, swelling, bleeding or pus, call/return!  You may shower, but no soaking baths/hot tubs/pools for 1 week.  Radial Site Care  This sheet gives you information about how to care for yourself after your procedure. Your health care provider may also give you more specific instructions. If you have problems or questions, contact your health care provider. What can I expect after the procedure? After the procedure, it is common to have:  Bruising and tenderness at the catheter insertion area. Follow these instructions at home: Medicines  Take over-the-counter and prescription medicines only as told by your health care provider. Insertion site care  Follow instructions from your health care provider about how to take care of your insertion site. Make sure you: ? Wash your hands with soap and water before you change your bandage (dressing). If soap and water are not available, use hand sanitizer. ? Change your dressing as told by your health care provider. ? Leave stitches (sutures), skin glue, or adhesive strips in place. These skin closures may need to stay in place for 2 weeks or longer. If adhesive strip edges start to loosen and curl up, you may trim the loose edges. Do not remove adhesive strips completely unless your health care provider tells you to do that.  Check your insertion site every day for signs of infection. Check for: ? Redness, swelling, or pain. ? Fluid or blood. ? Pus or a bad smell. ? Warmth.  Do not take baths, swim, or use a hot tub until your health care provider approves.  You may shower 24 hours after the procedure, or as directed by your health care provider. ? Remove the dressing and gently wash the site with plain soap and water. ? Pat the area dry with a clean towel. ? Do not rub the site. That could cause  bleeding.  Do not apply powder or lotion to the site. Activity  For 24 hours after the procedure, or as directed by your health care provider: ? Do not flex or bend the affected arm. ? Do not push or pull heavy objects with the affected arm. ? Do not drive yourself home from the hospital or clinic. You may drive 24 hours after the procedure unless your health care provider tells you not to. ? Do not operate machinery or power tools.  Do not lift anything that is heavier than 10 lb (4.5 kg), or the limit that you are told, until your health care provider says that it is safe.  Ask your health care provider when it is okay to: ? Return to work or school. ? Resume usual physical activities or sports. ? Resume sexual activity.   General instructions  If the catheter site starts to bleed, raise your arm and put firm pressure on the site. If the bleeding does not stop, get help right away. This is a medical emergency.  If you went home on the same day as your procedure, a responsible adult should be with you for the first 24 hours after you arrive home.  Keep all follow-up visits as told by your health care provider. This is important. Contact a health care provider if:  You have a fever.  You have redness, swelling, or yellow drainage around your insertion site. Get help right away if:  You have unusual pain at  the radial site.  The catheter insertion area swells very fast.  The insertion area is bleeding, and the bleeding does not stop when you hold steady pressure on the area.  Your arm or hand becomes pale, cool, tingly, or numb. These symptoms may represent a serious problem that is an emergency. Do not wait to see if the symptoms will go away. Get medical help right away. Call your local emergency services (911 in the [place] Do not drive yourself to the hospital. Summary  After the procedure, it is common to have bruising and tenderness at the site.  Follow instructions  from your health care provider about how to take care of your radial site wound. Check the wound every day for signs of infection.  Do not lift anything that is heavier than 10 lb (4.5 kg), or the limit that you are told, until your health care provider says that it is safe. This information is not intended to replace advice given to you by your health care provider. Make sure you discuss any questions you have with your health care provider. Document Revised: 09/11/2017 Document Reviewed: 09/11/2017 Elsevier Patient Education  2021 [place] about your medication: Plavix (anti-platelet agent)  Generic Name (Brand): clopidogrel (Plavix), once daily medication  PURPOSE: You are taking this medication along with aspirin to lower your chance of having a heart attack, stroke, or blood clots in your heart stent. These can be fatal. Plavix and aspirin help prevent platelets from sticking together and forming a clot that can block an artery or your stent.   Common SIDE EFFECTS you may experience include: bruising or bleeding more easily, shortness of breath  Do not stop taking PLAVIX without talking to the doctor who prescribes it for you. People who are treated with a stent and stop taking Plavix too soon, have a higher risk of getting a blood clot in the stent, having a heart attack, or dying. If you stop Plavix because of bleeding, or for other reasons, your risk of a heart attack or stroke may increase.   Avoid taking NSAID agents or anti-inflammatory medications such as ibuprofen, naproxen given increased bleed risk with plavix - can use acetaminophen (Tylenol) if needed for pain.  Avoid taking over the counter stomach medications omeprazole (Prilosec) or esomeprazole (Nexium) since these do interact and make plavix less effective - ask your pharmacist or doctor for alterative agents if needed for heartburn or GERD.   Tell all of your doctors and dentists that you are  taking Plavix. They should talk to the doctor who prescribed Plavix for you before you have any surgery or invasive procedure.   Contact your health care provider if you experience: severe or uncontrollable bleeding, pink/red/brown urine, vomiting blood or vomit that looks like "coffee grounds", red or black stools (looks like tar), coughing up blood or blood clots ----------------------------------------------------------------------------------------------------------------------

## 2020-10-14 NOTE — Interval H&P Note (Signed)
Cath Lab Visit (complete for each Cath Lab visit)  Clinical Evaluation Leading to the Procedure:   ACS: No.  Non-ACS:    Anginal Classification: CCS II  Anti-ischemic medical therapy: Maximal Therapy (2 or more classes of medications)  Non-Invasive Test Results: Intermediate-risk stress test findings: cardiac mortality 1-3%/year  Prior CABG: No previous CABG      History and Physical Interval Note:  10/14/2020 12:51 PM  Alyssa Stone  has presented today for surgery, with the diagnosis of CAD.  The various methods of treatment have been discussed with the patient and family. After consideration of risks, benefits and other options for treatment, the patient has consented to  Procedure(s): LEFT HEART CATH AND CORONARY ANGIOGRAPHY (N/A) as a surgical intervention.  The patient's history has been reviewed, patient examined, no change in status, stable for surgery.  I have reviewed the patient's chart and labs.  Questions were answered to the patient's satisfaction.     Larae Grooms

## 2020-10-14 NOTE — Progress Notes (Signed)
Dr. Irish Lack in to see patient. No new orders received.

## 2020-10-14 NOTE — Progress Notes (Signed)
Pharmacist in to see pt for Plavix education.

## 2020-10-14 NOTE — Discharge Summary (Addendum)
Discharge Summary    Patient ID: Alyssa Stone MRN: [id number]; DOB: 12-19-1955  Admit date: 10/14/2020 Discharge date: 10/14/2020  PCP:  Pleas Koch, NP   [place]  Cardiologist:  Mertie Moores, MD    Discharge Diagnoses    Principal Problem:   CAD in native artery Active Problems:   Anemia   Type 2 diabetes mellitus ([place])   Hyperlipidemia   Sinus tachycardia    Diagnostic Studies/Procedures    Left heart cath 10/14/20:  [place] LAD to Prox LAD lesion is 70% stenosed, significant by pressure wire. A drug-eluting stent was successfully placed using a STENT RESOLUTE ONYX 3.5X26, postdilated to > 4 mm and optimized with OCT.  Post intervention, there is a 0% residual stenosis.  Dist LAD lesion is 75% stenosed. Small distal vessel; would treat medically.  The left ventricular systolic function is normal.  LV end diastolic pressure is normal.  The left ventricular ejection fraction is 55-65% by visual estimate.  There is no aortic valve stenosis.   Continue aggressive secondary prevention.  Continue DAPT for 6 months.  Consider clopidogrel monotherapy after 6 months given diffuse apical LAD disease.   _____________   History of Present Illness     Alyssa Stone is a 65 y.o. female with sinus tachycardia, DM, HLD, and family history of CAD. She was referred to Dr. Acie Fredrickson for sinus tachycardia. She was started on BB, TSH 4.14. Based on exertional symptoms, she was sent for CT coronary which revealed a calcium score of zero but noncalcified proximal LAD stenosis. Results were discussed along with cardiac catheterization.  She agreed to proceed.    Hospital Course     Consultants: none   CAD She presented for scheduled heart cath which revealed 70% proximal LAD stenosis that was significant by pressure wire treated with DES. She tolerated the procedure well and was discharged under same-day PCI protocols. Cath site C/D/I. She was  discharged on ASA and plavix.    Hyperlipidemia with LDL goal < 70 10/07/2020: Cholesterol, Total 127; HDL 42; LDL Chol Calc (NIH) 64; Triglycerides 113 Continue 40 mg lipitor.   Sinus tachycardia Better on BB    Did the patient have an acute coronary syndrome (MI, NSTEMI, STEMI, etc) this admission?:  No                               Did the patient have a percutaneous coronary intervention (stent / angioplasty)?:  Yes.     Cath/PCI Registry Performance & Quality Measures: 1. Aspirin prescribed? - Yes 2. ADP Receptor Inhibitor (Plavix/Clopidogrel, Brilinta/Ticagrelor or Effient/Prasugrel) prescribed (includes medically managed patients)? - Yes 3. High Intensity Statin (Lipitor 40-80mg  or Crestor 20-40mg ) prescribed? - Yes 4. For EF <40%, was ACEI/ARB prescribed? - Not Applicable (EF >/= 77%) 5. For EF <40%, Aldosterone Antagonist (Spironolactone or Eplerenone) prescribed? - Not Applicable (EF >/= 41%) 6. Cardiac Rehab Phase II ordered? - Yes       _____________  Discharge Vitals Blood pressure (!) 132/52, pulse 77, resp. rate 18, height (P) 5' 7.25" (1.708 m), weight (P) 96.6 kg, SpO2 99 %.  Filed Weights   10/14/20 0914  Weight: (P) 96.6 kg    Labs & Radiologic Studies    CBC No results for input(s): WBC, NEUTROABS, HGB, HCT, MCV, PLT in the last 72 hours. Basic Metabolic Panel No results for input(s): NA, K, CL, CO2, GLUCOSE, BUN, CREATININE, CALCIUM,  MG, PHOS in the last 72 hours. Liver Function Tests No results for input(s): AST, ALT, ALKPHOS, BILITOT, PROT, ALBUMIN in the last 72 hours. No results for input(s): LIPASE, AMYLASE in the last 72 hours. High Sensitivity Troponin:   No results for input(s): TROPONINIHS in the last 720 hours.  BNP Invalid input(s): POCBNP D-Dimer No results for input(s): DDIMER in the last 72 hours. Hemoglobin A1C No results for input(s): HGBA1C in the last 72 hours. Fasting Lipid Panel No results for input(s): CHOL, HDL, LDLCALC,  TRIG, CHOLHDL, LDLDIRECT in the last 72 hours. Thyroid Function Tests No results for input(s): TSH, T4TOTAL, T3FREE, THYROIDAB in the last 72 hours.  Invalid input(s): FREET3 _____________  CARDIAC CATHETERIZATION  Addendum Date: 10/14/2020    Ost LAD to Prox LAD lesion is 70% stenosed, significant by pressure wire. A drug-eluting stent was successfully placed using a STENT RESOLUTE ONYX 3.5X26, postdilated to > 4 mm and optimized with OCT.  Post intervention, there is a 0% residual stenosis.  Dist LAD lesion is 75% stenosed. Small distal vessel; would treat medically.  The left ventricular systolic function is normal.  LV end diastolic pressure is normal.  The left ventricular ejection fraction is 55-65% by visual estimate.  There is no aortic valve stenosis.  Continue aggressive secondary prevention.  Continue DAPT for 6 months.  Consider clopidogrel monotherapy after 6 months given diffuse apical LAD disease.   Addendum Date: 10/14/2020    Ost LAD to Prox LAD lesion is 70% stenosed, significant by pressure wire. A drug-eluting stent was successfully placed using a STENT RESOLUTE ONYX 3.5X26, postdilated to > 4 mm and optimized with OCT.  Post intervention, there is a 0% residual stenosis.  Dist LAD lesion is 75% stenosed. Small distal vessel; would treat medically.  The left ventricular systolic function is normal.  LV end diastolic pressure is normal.  The left ventricular ejection fraction is 55-65% by visual estimate.  There is no aortic valve stenosis.  Continue aggressive secondary prevention.  Continue DAPT for 6 months.  Consider clopidogrel monotherapy after 6 months given diffuse apical LAD disease.   Result Date: 10/14/2020  Ost LAD to Prox LAD lesion is 70% stenosed, significant by pressure wire. A drug-eluting stent was successfully placed using a STENT RESOLUTE ONYX 3.5X26, postdilated to > 4 mm and optimized with OCT.  Post intervention, there is a 0% residual stenosis.   Dist LAD lesion is 75% stenosed. Small distal vessel; would treat medically.  The left ventricular systolic function is normal.  LV end diastolic pressure is normal.  The left ventricular ejection fraction is 55-65% by visual estimate.  There is no aortic valve stenosis.  Continue aggressive secondary prevention.  Continue DAPT for 6 months.  Consider clopidogrel monotherapy after 6 months given diffuse apical LAD disease.   CT CORONARY MORPH W/CTA COR W/SCORE W/CA W/CM &/OR WO/CM  Addendum Date: 09/29/2020   ADDENDUM REPORT: 09/29/2020 14:37 CLINICAL DATA:  65 year old with chest pain EXAM: Cardiac/Coronary  CTA TECHNIQUE: The patient was scanned on a [employer]. FINDINGS: A 110 kV prospective scan was triggered in the descending thoracic aorta at 111 HU's. Axial non-contrast 3 mm slices were carried out through the heart. The data set was analyzed on a dedicated work station and scored using the [place]. Gantry rotation speed was 250 msecs and collimation was .6 mm. Beta blockade and 0.8 mg of sl NTG was given. The 3D data set was reconstructed in 5% intervals of the  67-82 % of the R-R cycle. Diastolic phases were analyzed on a dedicated work station using MPR, MIP and VRT modes. The patient received 80 cc of contrast. Aorta:  Normal size.  No calcifications.  No dissection. Aortic Valve:  [place].  No calcifications. Coronary Arteries:  Normal coronary origin.  Right dominance. RCA is a co dominant artery. There is possible non flow limiting plaque 20%. Left main is a large artery that gives rise to LAD and LCX arteries. LAD is a large vessel that has proximal LAD non calcified plaque 75-90% stenosis. LCX is a large co dominant artery that gives rise PDA. 2 OM branches. There is no plaque. Other findings: Normal pulmonary vein drainage into the left atrium. Normal left atrial appendage without a thrombus. Normal size of the pulmonary artery. Please see radiology report for non  cardiac findings. IMPRESSION: 1. Coronary calcium score of 0. This was 0 percentile for age and sex matched control. 2. Normal coronary origin with co dominance. 3. Proximal LAD non calcified stenosis of 15 mm length, 75-80% stenosis. Sending for FFR. Candee Furbish, MD [facility] Electronically Signed   By: Candee Furbish MD   On: 09/29/2020 14:37   Result Date: 09/29/2020 EXAM: OVER-READ INTERPRETATION  CT CHEST The following report is an over-read performed by radiologist Dr. Aletta Edouard of [facility], [place] on 09/29/2020. This over-read does not include interpretation of cardiac or coronary anatomy or pathology. The coronary CTA interpretation by the cardiologist is attached. COMPARISON:  None. FINDINGS: Vascular: No significant vascular findings. Normal heart size. No pericardial effusion. Mediastinum/Nodes: Small hiatal hernia present. No visualized lymphadenopathy or masses. Lungs/Pleura: Visualized lungs show no evidence of pulmonary edema, consolidation, pneumothorax, nodule or pleural fluid. Upper Abdomen: No acute abnormality. Musculoskeletal: No chest wall mass or suspicious bone lesions identified. IMPRESSION: Small hiatal hernia. Electronically Signed: By: Aletta Edouard M.D. On: 09/29/2020 11:07   CT CORONARY FRACTIONAL FLOW RESERVE FLUID ANALYSIS  Result Date: 09/29/2020 EXAM: FFRCT ANALYSIS 65 year old female with chest pain FINDINGS: FFRct analysis was performed on the original cardiac CT angiogram dataset. Diagrammatic representation of the FFRct analysis is provided in a separate PDF document in PACS. This dictation was created using the PDF document and an interactive 3D model of the results. 3D model is not available in the EMR/PACS. Normal FFR range is >0.80. 1. Left Main: 0.99 2. LAD: Proximal 0.97, mid 0.97, mid to distal 0.78 3. LCX: Proximal 0.97, mid 0.97, distal OM two 0.73 4. Ramus: n/a 5. RCA: Proximal 0.98, mid 0.91 IMPRESSION: 1. Abnormal FFR of LAD and distal OM 2. Consider  cardiac catheterization if clinically symptoms are worrisome. Candee Furbish, MD [facility] Note: These examples are not recommendations of [employer] and only provided as examples of what other customers are doing. Electronically Signed   By: Candee Furbish MD   On: 09/29/2020 20:45   Disposition   Pt is being discharged home today in good condition.  Follow-up Plans & Appointments   Cardiology follow up has been arranged.  Discharge Instructions    Amb Referral to Cardiac Rehabilitation   Complete by: As directed    Diagnosis:  Coronary Stents PTCA     After initial evaluation and assessments completed: Virtual Based Care may be provided alone or in conjunction with Phase 2 Cardiac Rehab based on patient barriers.: Yes   Call MD for:  difficulty breathing, headache or visual disturbances   Complete by: As directed    Call MD for:  extreme fatigue  Complete by: As directed    Call MD for:  hives   Complete by: As directed    Call MD for:  persistant dizziness or light-headedness   Complete by: As directed    Call MD for:  persistant nausea and vomiting   Complete by: As directed    Call MD for:  redness, tenderness, or signs of infection (pain, swelling, redness, odor or green/yellow discharge around incision site)   Complete by: As directed    Call MD for:  severe uncontrolled pain   Complete by: As directed    Call MD for:  temperature >100.4   Complete by: As directed    Diet - low sodium heart healthy   Complete by: As directed    Discharge instructions   Complete by: As directed    No driving for 3 days. No lifting over 5 lbs for 1 week. No sexual activity for 1 week. Keep procedure site clean & dry. If you notice increased pain, swelling, bleeding or pus, call/return!  You may shower, but no soaking baths/hot tubs/pools for 1 week.   PLEASE DO NOT MISS ANY DOSES OF YOUR PLAVIX!!!!! Also keep a log of you blood pressures and bring back to your follow up appt. Please call the office  with any questions.   Patients taking blood thinners should generally stay away from medicines like ibuprofen, Advil, Motrin, naproxen, and Aleve due to risk of stomach bleeding. You may take Tylenol as directed or talk to your primary doctor about alternatives.  Some studies suggest Prilosec/Omeprazole interacts with Plavix. If you have reflux symptoms, please use Protonix for less chance of interaction.  PLEASE ENSURE THAT YOU DO NOT RUN OUT OF YOUR PLAVIX. IF you have issues obtaining this medication due to cost please CALL the office 3-5 business days prior to running out in order to prevent missing doses of this medication.   Increase activity slowly   Complete by: As directed       Discharge Medications   Allergies as of 10/14/2020   No Known Allergies     Medication List    TAKE these medications   aspirin EC 81 MG tablet Take 1 tablet (81 mg total) by mouth daily. Swallow whole.   atorvastatin 40 MG tablet Commonly known as: LIPITOR TAKE 1 TABLET BY MOUTH EVERY EVENING FOR CHOLESTEROL What changed:   when to take this  additional instructions   B-D ULTRAFINE III SHORT PEN 31G X 8 MM Misc Generic drug: Insulin Pen Needle USE NIGHTLY WITH INSULIN PEN FOR DIABETES.   clopidogrel 75 MG tablet Commonly known as: Plavix Take 1 tablet (75 mg total) by mouth daily.   glipiZIDE 5 MG tablet Commonly known as: GLUCOTROL Take 1 tablet (5 mg total) by mouth 2 (two) times daily before a meal. For diabetes.   Lantus SoloStar 100 UNIT/ML Solostar Pen Generic drug: insulin glargine Inject 20 Units into the skin every evening. For diabetes What changed: when to take this   metFORMIN 500 MG 24 hr tablet Commonly known as: Glucophage XR Take 1 tablet (500 mg total) by mouth 2 (two) times daily with a meal. Resume on 10/17/20. What changed: additional instructions   metoprolol succinate 25 MG 24 hr tablet Commonly known as: TOPROL-XL TAKE 1 TABLET BY MOUTH DAILY. FOR HEART  RATE. What changed: See the new instructions.   nitroGLYCERIN 0.4 MG SL tablet Commonly known as: NITROSTAT Place 1 tablet (0.4 mg total) under the tongue every 5 (five)  minutes as needed for chest pain.   OneTouch Delica Plus EPPIRJ18A Misc 1 each by Other route as directed.   OneTouch Verio test strip Generic drug: glucose blood 1 each by Other route 4 (four) times daily.   Vitamin B-12 5000 MCG Subl Place 5,000 mcg under the tongue daily.   vitamin C 1000 MG tablet Take 1,000 mg by mouth daily.   zinc gluconate 50 MG tablet Take 50 mg by mouth daily.       Outstanding Labs/Studies     Duration of Discharge Encounter   Greater than 30 minutes including physician time.  Signed, Kathyrn Drown, NP 10/14/2020, 5:15 PM   I have examined the patient and reviewed assessment and plan and discussed with patient.  Agree with above as stated.  RIght radial stable.  Palpable radial pulse.  No hematoma.  COntinue DAPT for 6 months. COnsider long term clopidogrel monotherpy since she has some small vessel disease at the apical LAD that is best managed medically.  COntinue with healthy diet and RF modification, DM management, high potency statin,.  Larae Grooms

## 2020-10-14 NOTE — Progress Notes (Signed)
NP in to see pt. No new orders received. Okay to DC home at 2000.

## 2020-10-14 NOTE — Progress Notes (Signed)
Discussed stent, Plavix, restrictions, diet, exercise, NTG and CRPII. Pt voiced understanding, requests referral be sent to [facility].  [place] CES, ACSM 3:20 PM 10/14/2020

## 2020-10-14 NOTE — Progress Notes (Signed)
Cardiac rehab in to see pt.

## 2020-10-14 NOTE — Progress Notes (Signed)
Plavix given to patient by transition of care pharmacy.

## 2020-10-17 ENCOUNTER — Encounter (HOSPITAL_COMMUNITY): Payer: Self-pay | Admitting: Interventional Cardiology

## 2020-10-28 ENCOUNTER — Other Ambulatory Visit: Payer: Self-pay | Admitting: Primary Care

## 2020-10-28 DIAGNOSIS — Z1231 Encounter for screening mammogram for malignant neoplasm of breast: Secondary | ICD-10-CM

## 2020-10-31 NOTE — Progress Notes (Addendum)
Cardiology Office Note:    Date:  11/01/2020   ID:  Alyssa Stone, DOB June 27, 1956, MRN [id number]  PCP:  Alyssa Koch, NP   [facility]  Cardiologist:  Alyssa Moores, MD   Electrophysiologist:  None       Referring MD: Alyssa Koch, NP   Chief Complaint:  Hospitalization Follow-up (S/p PCI)    Patient Profile:    Alyssa Stone is a 65 y.o. female with:   Coronary artery disease   S/p DES to o-pLAD; dLAD 75 (small, med Rx)  Sinus tachycardia, Rx w beta-blocker   Diabetes mellitus   Hyperlipidemia   FHx of CAD   Hx of COVID-19 x 2  GERD  Prior CV studies: Cardiac catheterization Oct 30, 2020 LAD ostial-proximal 70 (FFR 0.77), distal 75 (small-med Rx) EF 55-65 PCI: 3.5 x 26 mm Resolute Onyx DES to the ostial - proximal LAD  Coronary CTA 09/29/20 1. Coronary calcium score of 0. This was 0 percentile for age and sex matched control. 2. Normal coronary origin with co dominance. 3. Proximal LAD non calcified stenosis of 15 mm length, 75-80% stenosis. Sending for FFR. FFR 1. Left Main: 0.99 2. LAD: Proximal 0.97, mid 0.97, mid to distal 0.78 3. LCX: Proximal 0.97, mid 0.97, distal OM two 0.73 4. Ramus: n/a 5. RCA: Proximal 0.98, mid 0.91 IMPRESSION: 1. Abnormal FFR of LAD and distal OM 2. Consider cardiac catheterization if clinically symptoms are worrisome.   History of Present Illness:    Alyssa Stone was last seen by Dr. Acie Fredrickson 10/07/20.  She had been having exertional chest pain.  A coronary CTA demonstrated a calcium score of 0.  However, there was non-calcific plaque in the pLAD that was significant by FFR.  Cardiac catheterization was arranged and demonstrated hemodynamically significant stenosis in the ostial to proximal LAD which was tx with a DES.  There was mod to severe distal LAD disease.  The vessel is small and med Rx was recommended.  She returns for f/u.  She is here alone.  She has felt well since her  PCI.  She has not had exertional chest pain.  She has not had significant shortness of breath, orthopnea, leg edema, syncope.        Past Medical History:  Diagnosis Date  . Allergy   . Anemia   . CAD (coronary artery disease)    s/p DES to o-pLAD in 09/2020 // dLAD 75 (small, med Rx)  . Chicken pox   . Depression   . Diabetes mellitus   . Fainting episodes   . Gastroesophageal reflux disease   . Hyperlipidemia    controlled om meds   . Sinus tachycardia     Current Medications: Current Meds  Medication Sig  . Ascorbic Acid (VITAMIN C) 1000 MG tablet Take 1,000 mg by mouth daily.  Marland Kitchen aspirin EC 81 MG tablet Take 1 tablet (81 mg total) by mouth daily. Swallow whole.  Marland Kitchen atorvastatin (LIPITOR) 40 MG tablet TAKE 1 TABLET BY MOUTH EVERY EVENING FOR CHOLESTEROL  . clopidogrel (PLAVIX) 75 MG tablet Take 1 tablet (75 mg total) by mouth daily.  . Cyanocobalamin (VITAMIN B-12) 5000 MCG SUBL Place 5,000 mcg under the tongue daily.  Marland Kitchen glipiZIDE (GLUCOTROL) 5 MG tablet Take 1 tablet (5 mg total) by mouth 2 (two) times daily before a meal. For diabetes.  . insulin glargine (LANTUS SOLOSTAR) 100 UNIT/ML Solostar Pen Inject 20 Units into the skin every evening. For diabetes  .  Insulin Pen Needle (B-D ULTRAFINE III SHORT PEN) 31G X 8 MM MISC USE NIGHTLY WITH INSULIN PEN FOR DIABETES.  . Lancets (ONETOUCH DELICA PLUS [id number]) MISC 1 each by Other route as directed.  . metFORMIN (GLUCOPHAGE XR) 500 MG 24 hr tablet Take 1 tablet (500 mg total) by mouth 2 (two) times daily with a meal. Resume on 10/17/20.  . metoprolol succinate (TOPROL-XL) 25 MG 24 hr tablet TAKE 1 TABLET BY MOUTH DAILY. FOR HEART RATE.  . nitroGLYCERIN (NITROSTAT) 0.4 MG SL tablet Place 1 tablet (0.4 mg total) under the tongue every 5 (five) minutes as needed for chest pain.  Glory Rosebush VERIO test strip 1 each by Other route 4 (four) times daily.  Marland Kitchen zinc gluconate 50 MG tablet Take 50 mg by mouth daily.     Allergies:   Patient has  no known allergies.   Social History   Tobacco Use  . Smoking status: Former Research scientist (life sciences)  . Smokeless tobacco: Never Used  Substance Use Topics  . Alcohol use: Not Currently  . Drug use: Never     Family Hx: The patient's family history includes Cancer in her mother and sister; Diabetes in her brother, mother, and sister; Heart attack in her brother and father; Hyperlipidemia in her brother; Hypertension in her father; Kidney disease in her brother; Miscarriages / [place] in her mother. There is no history of Colon cancer, Colon polyps, Esophageal cancer, Rectal cancer, or Stomach cancer.  ROS   EKGs/Labs/Other Test Reviewed:    EKG:  EKG is   ordered today.  The ekg ordered today demonstrates normal sinus rhythm, HR 88, LAD, no ST-TW changes, no change from prior tracing, QTc 313 ms.  Recent Labs: 08/10/2020: TSH 4.14 10/07/2020: ALT 14; BUN 12; Creatinine, Ser 0.51; Hemoglobin 13.2; Platelets 377; Potassium 4.8; Sodium 138   Recent Lipid Panel Lab Results  Component Value Date/Time   CHOL 127 10/07/2020 10:16 AM   TRIG 113 10/07/2020 10:16 AM   HDL 42 10/07/2020 10:16 AM   CHOLHDL 3.0 10/07/2020 10:16 AM   CHOLHDL 4 10/15/2019 09:55 AM   LDLCALC 64 10/07/2020 10:16 AM   LDLDIRECT 137.0 09/03/2019 12:25 PM      Risk Assessment/Calculations:      Physical Exam:    VS:  BP 124/60   Pulse 100   Ht 5\' 7"  (1.702 m)   Wt 215 lb 9.6 oz (97.8 kg)   SpO2 96%   BMI 33.77 kg/m     Wt Readings from Last 3 Encounters:  11/01/20 215 lb 9.6 oz (97.8 kg)  10/14/20 (P) 213 lb (96.6 kg)  10/07/20 212 lb (96.2 kg)     Constitutional:      Appearance: Healthy appearance. Not in distress.  Neck:     Vascular: JVD normal.  Pulmonary:     Effort: Pulmonary effort is normal.     Breath sounds: No wheezing. No rales.  Cardiovascular:     Normal rate. Regular rhythm. Normal S1. Normal S2.     Murmurs: There is no murmur.     Comments: R wrist w/o hematoma  Edema:     Peripheral edema absent.  Abdominal:     Palpations: Abdomen is soft. There is no hepatomegaly.  Skin:    General: Skin is warm and dry.  Neurological:     Mental Status: Alert and oriented to person, place and time.     Cranial Nerves: Cranial nerves are intact.       ASSESSMENT &  PLAN:    1. Coronary artery disease involving native coronary artery of native heart without angina pectoris She is doing well since her recent PCI with DES to the ostial-proximal LAD.  She has residual distal 75% LAD stenosis that is managed medically.  She is currently not having anginal symptoms.  We discussed the importance of uninterrupted dual antiplatelet therapy for the next 6 months.  She will likely be able to transition to clopidogrel alone after 6 months.  Continue current dose of aspirin, clopidogrel, atorvastatin, metoprolol succinate.  I have encouraged her to start cardiac rehabilitation.  Follow-up with Dr. Acie Fredrickson in 6 months.  2. Mixed hyperlipidemia LDL optimal on most recent lab work.  Continue current Rx.    3. Sinus tachycardia Managed with beta-blocker therapy  4. Type 2 diabetes mellitus with diabetic neuropathy, without long-term current use of insulin (HCC) Most recent A1c 6.9.  Managed by primary care.      Dispo:  Return in about 6 months (around 05/04/2021) for Routine Follow Up, w/ Dr. Acie Fredrickson, in person.   Medication Adjustments/Labs and Tests Ordered: Current medicines are reviewed at length with the patient today.  Concerns regarding medicines are outlined above.  Tests Ordered: Orders Placed This Encounter  Procedures  . EKG 12-Lead   Medication Changes: No orders of the defined types were placed in this encounter.   Signed, Richardson Dopp, PA-C  11/01/2020 9:28 AM    [facility] [place], [place], [place]  [address] Phone: [phone]; Fax: [phone]

## 2020-11-01 ENCOUNTER — Other Ambulatory Visit: Payer: Self-pay

## 2020-11-01 ENCOUNTER — Encounter: Payer: Self-pay | Admitting: Physician Assistant

## 2020-11-01 ENCOUNTER — Ambulatory Visit: Payer: PPO | Admitting: Physician Assistant

## 2020-11-01 VITALS — BP 124/60 | HR 100 | Ht 67.0 in | Wt 215.6 lb

## 2020-11-01 DIAGNOSIS — I2510 Atherosclerotic heart disease of native coronary artery without angina pectoris: Secondary | ICD-10-CM | POA: Diagnosis not present

## 2020-11-01 DIAGNOSIS — E1140 Type 2 diabetes mellitus with diabetic neuropathy, unspecified: Secondary | ICD-10-CM | POA: Diagnosis not present

## 2020-11-01 DIAGNOSIS — E782 Mixed hyperlipidemia: Secondary | ICD-10-CM

## 2020-11-01 DIAGNOSIS — R000 Tachycardia, unspecified: Secondary | ICD-10-CM

## 2020-11-01 NOTE — Patient Instructions (Signed)
Medication Instructions:  Your physician recommends that you continue on your current medications as directed. Please refer to the Current Medication list given to you today.  *If you need a refill on your cardiac medications before your next appointment, please call your pharmacy*   Lab Work: -None If you have labs (blood work) drawn today and your tests are completely normal, you will receive your results only by: Marland Kitchen MyChart Message (if you have MyChart) OR . A paper copy in the mail If you have any lab test that is abnormal or we need to change your treatment, we will call you to review the results.   Testing/Procedures: -None    Follow-Up: At [facility], you and your health needs are our priority.  As part of our continuing mission to provide you with exceptional heart care, we have created designated Provider Care Teams.  These Care Teams include your primary Cardiologist (physician) and Advanced Practice Providers (APPs -  Physician Assistants and Nurse Practitioners) who all work together to provide you with the care you need, when you need it.  We recommend signing up for the patient portal called "MyChart".  Sign up information is provided on this After Visit Summary.  MyChart is used to connect with patients for Virtual Visits (Telemedicine).  Patients are able to view lab/test results, encounter notes, upcoming appointments, etc.  Non-urgent messages can be sent to your provider as well.   To learn more about what you can do with MyChart, go to [email or web address].    Your next appointment:   6 month(s)  The format for your next appointment:   In Person  Provider:   Your physician wants you to follow-up in: 6 month with Dr. Acie Fredrickson.  You will receive a reminder letter in the mail two months in advance. If you don't receive a letter, please call our office to schedule the follow-up appointment.     Other Instructions

## 2020-11-08 ENCOUNTER — Other Ambulatory Visit: Payer: Self-pay | Admitting: Primary Care

## 2020-11-08 DIAGNOSIS — E1140 Type 2 diabetes mellitus with diabetic neuropathy, unspecified: Secondary | ICD-10-CM

## 2020-12-01 DIAGNOSIS — E113293 Type 2 diabetes mellitus with mild nonproliferative diabetic retinopathy without macular edema, bilateral: Secondary | ICD-10-CM | POA: Diagnosis not present

## 2020-12-14 ENCOUNTER — Ambulatory Visit: Payer: PPO | Admitting: Physician Assistant

## 2020-12-21 ENCOUNTER — Other Ambulatory Visit: Payer: Self-pay | Admitting: Primary Care

## 2020-12-21 DIAGNOSIS — E1140 Type 2 diabetes mellitus with diabetic neuropathy, unspecified: Secondary | ICD-10-CM

## 2020-12-22 ENCOUNTER — Ambulatory Visit
Admission: RE | Admit: 2020-12-22 | Discharge: 2020-12-22 | Disposition: A | Discharge status: Home / Self Care | Payer: PPO | Source: Ambulatory Visit | Attending: Primary Care | Admitting: Primary Care

## 2020-12-22 ENCOUNTER — Other Ambulatory Visit: Payer: Self-pay

## 2020-12-22 DIAGNOSIS — Z1231 Encounter for screening mammogram for malignant neoplasm of breast: Secondary | ICD-10-CM

## 2021-01-13 ENCOUNTER — Telehealth: Payer: Self-pay

## 2021-01-13 DIAGNOSIS — H3563 Retinal hemorrhage, bilateral: Secondary | ICD-10-CM | POA: Diagnosis not present

## 2021-01-13 DIAGNOSIS — E113513 Type 2 diabetes mellitus with proliferative diabetic retinopathy with macular edema, bilateral: Secondary | ICD-10-CM | POA: Diagnosis not present

## 2021-01-13 DIAGNOSIS — H2513 Age-related nuclear cataract, bilateral: Secondary | ICD-10-CM | POA: Diagnosis not present

## 2021-01-13 DIAGNOSIS — Z006 Encounter for examination for normal comparison and control in clinical research program: Secondary | ICD-10-CM

## 2021-01-13 NOTE — Telephone Encounter (Signed)
Called patient for 90 day Identify phone call pt stated she is not having anymore cardiac symptoms but has followed up with PCP, pt also stated she had a heart catheterization with stent placement. I reminded the patient that we would be calling her back around February for a year follow up phone call.

## 2021-01-25 DIAGNOSIS — E113513 Type 2 diabetes mellitus with proliferative diabetic retinopathy with macular edema, bilateral: Secondary | ICD-10-CM | POA: Diagnosis not present

## 2021-02-09 ENCOUNTER — Ambulatory Visit (INDEPENDENT_AMBULATORY_CARE_PROVIDER_SITE_OTHER): Payer: PPO | Admitting: Primary Care

## 2021-02-09 ENCOUNTER — Other Ambulatory Visit: Payer: Self-pay

## 2021-02-09 ENCOUNTER — Encounter: Payer: Self-pay | Admitting: Primary Care

## 2021-02-09 VITALS — BP 122/64 | HR 68 | Temp 97.3°F | Ht 67.0 in | Wt 215.0 lb

## 2021-02-09 DIAGNOSIS — Z0000 Encounter for general adult medical examination without abnormal findings: Secondary | ICD-10-CM

## 2021-02-09 DIAGNOSIS — E782 Mixed hyperlipidemia: Secondary | ICD-10-CM

## 2021-02-09 DIAGNOSIS — Z23 Encounter for immunization: Secondary | ICD-10-CM

## 2021-02-09 DIAGNOSIS — E1140 Type 2 diabetes mellitus with diabetic neuropathy, unspecified: Secondary | ICD-10-CM | POA: Diagnosis not present

## 2021-02-09 DIAGNOSIS — I2510 Atherosclerotic heart disease of native coronary artery without angina pectoris: Secondary | ICD-10-CM

## 2021-02-09 LAB — MICROALBUMIN / CREATININE URINE RATIO
Creatinine,U: 72.9 mg/dL
Microalb Creat Ratio: 1 mg/g (ref 0.0–30.0)
Microalb, Ur: 0.7 mg/dL (ref 0.0–1.9)

## 2021-02-09 LAB — POCT GLYCOSYLATED HEMOGLOBIN (HGB A1C): Hemoglobin A1C: 7.1 % — AB (ref 4.0–5.6)

## 2021-02-09 NOTE — Assessment & Plan Note (Signed)
Pneumovax due today, provided. Mammogram and bone density scan UTD. Colonoscopy UTD, due in 2024.  Discussed the importance of a healthy diet and regular exercise in order for weight loss, and to reduce the risk of further co-morbidity.  Exam today stable. Labs reviewed.

## 2021-02-09 NOTE — Assessment & Plan Note (Signed)
Stable in the office today with A1C of 7.1.  Continue Metformin 500 mg BID, she had to reduce given nausea.  Continue Glipizide 5 mg BID. Continue Lantus 20 units HS.   Urine microalbumin due and pending. Foot exam due, completed. Managed on statin. Pneumonia vaccine due.  Follow up in 6 months.

## 2021-02-09 NOTE — Progress Notes (Signed)
Subjective:    Patient ID: Alyssa Stone, female    DOB: 09/03/1955, 65 y.o.   MRN: [id number]  HPI  Alyssa Stone is a very pleasant 65 y.o. female who presents today for complete physical.  Immunizations: -Covid-19: 2 vaccines -Shingles: Shingrix -Pneumonia: Prevnar 13 in 2021  Diet: [place].  Exercise: No regular exercise.  Eye exam: Completes annually  Dental exam: No recent exam    Mammogram: Completed in May 2022 Dexa: Completed in 2021 Colonoscopy: Completed in 2021, due 2024   BP Readings from Last 3 Encounters:  02/09/21 122/64  11/01/20 124/60  10/14/20 138/63      Review of Systems  Constitutional:  Negative for unexpected weight change.  HENT:  Negative for rhinorrhea.   Respiratory:  Negative for cough and shortness of breath.   Cardiovascular:  Negative for chest pain.  Gastrointestinal:  Negative for constipation and diarrhea.  Genitourinary:  Negative for difficulty urinating.  Musculoskeletal:  Negative for arthralgias and myalgias.  Skin:  Negative for rash.  Allergic/Immunologic: Negative for environmental allergies.  Neurological:  Negative for dizziness, numbness and headaches.  Psychiatric/Behavioral:  The patient is not nervous/anxious.         Past Medical History:  Diagnosis Date   Allergy    Anemia    CAD (coronary artery disease)    s/p DES to o-pLAD in 09/2020 // dLAD 75 (small, med Rx)   Chicken pox    Depression    Diabetes mellitus    Fainting episodes    Gastroesophageal reflux disease    Hyperlipidemia    controlled om meds    Sinus tachycardia     Social History   Socioeconomic History   Marital status: Divorced    Spouse name: Not on file   Number of children: 3   Years of education: Not on file   Highest education level: 12th grade  Occupational History   Not on file  Tobacco Use   Smoking status: Former    Pack years: 0.00   Smokeless tobacco: Never  Substance and Sexual Activity   Alcohol  use: Not Currently   Drug use: Never   Sexual activity: Not Currently  Other Topics Concern   Not on file  Social History Narrative   Not on file   Social Determinants of Health   Financial Resource Strain: Not on file  Food Insecurity: Not on file  Transportation Needs: Not on file  Physical Activity: Not on file  Stress: Not on file  Social Connections: Not on file  Intimate Partner Violence: Not on file    Past Surgical History:  Procedure Laterality Date   ANAL FISSURE REPAIR     CESAREAN SECTION     CORONARY STENT INTERVENTION N/A 10/14/2020   Procedure: CORONARY STENT INTERVENTION;  Surgeon: Jettie Booze, MD;  Location: [facility];  Service: Cardiovascular;  Laterality: N/A;   INTRAVASCULAR IMAGING/OCT N/A 10/14/2020   Procedure: INTRAVASCULAR IMAGING/OCT;  Surgeon: Jettie Booze, MD;  Location: [facility];  Service: Cardiovascular;  Laterality: N/A;   INTRAVASCULAR PRESSURE WIRE/FFR STUDY N/A 10/14/2020   Procedure: INTRAVASCULAR PRESSURE WIRE/FFR STUDY;  Surgeon: Jettie Booze, MD;  Location: [facility];  Service: Cardiovascular;  Laterality: N/A;   LEFT HEART CATH AND CORONARY ANGIOGRAPHY N/A 10/14/2020   Procedure: LEFT HEART CATH AND CORONARY ANGIOGRAPHY;  Surgeon: Jettie Booze, MD;  Location: [facility];  Service: Cardiovascular;  Laterality: N/A;  TONSILLECTOMY Bilateral     Family History  Problem Relation Age of Onset   Cancer Mother    Diabetes Mother    Miscarriages / [place] Mother    Hypertension Father    Heart attack Father    Cancer Sister    Diabetes Sister    Diabetes Brother    Heart attack Brother    Kidney disease Brother    Hyperlipidemia Brother    Colon cancer Neg Hx    Colon polyps Neg Hx    Esophageal cancer Neg Hx    Rectal cancer Neg Hx    Stomach cancer Neg Hx     No Known Allergies  Current Outpatient Medications on File Prior to Visit  Medication Sig Dispense  Refill   Ascorbic Acid (VITAMIN C) 1000 MG tablet Take 1,000 mg by mouth daily.     aspirin EC 81 MG tablet Take 1 tablet (81 mg total) by mouth daily. Swallow whole. 90 tablet 3   atorvastatin (LIPITOR) 40 MG tablet TAKE 1 TABLET BY MOUTH EVERY EVENING FOR CHOLESTEROL 90 tablet 3   clopidogrel (PLAVIX) 75 MG tablet Take 1 tablet (75 mg total) by mouth daily. 90 tablet 3   Cyanocobalamin (VITAMIN B-12) 5000 MCG SUBL Place 5,000 mcg under the tongue daily.     glipiZIDE (GLUCOTROL) 5 MG tablet TAKE 1 TABLET (5 MG TOTAL) BY MOUTH 2 (TWO) TIMES DAILY BEFORE A MEAL. FOR DIABETES. 180 tablet 2   insulin glargine (LANTUS SOLOSTAR) 100 UNIT/ML Solostar Pen Inject 20 Units into the skin every evening. For diabetes 15 mL 5   Insulin Pen Needle (B-D ULTRAFINE III SHORT PEN) 31G X 8 MM MISC USE NIGHTLY WITH INSULIN PEN FOR DIABETES. 100 each 3   Lancets (ONETOUCH DELICA PLUS [id number]) MISC USE TO TEST BLOOD SUGAR UP TO 4 TIMES A DAY 100 each 5   metFORMIN (GLUCOPHAGE-XR) 500 MG 24 hr tablet TAKE 2 TABLETS BY MOUTH 2 TIMES DAILY WITH A MEAL. FOR DIABETES. (Patient taking differently: One tab bid) 360 tablet 0   metoprolol succinate (TOPROL-XL) 25 MG 24 hr tablet TAKE 1 TABLET BY MOUTH DAILY. FOR HEART RATE. 90 tablet 1   nitroGLYCERIN (NITROSTAT) 0.4 MG SL tablet Place 1 tablet (0.4 mg total) under the tongue every 5 (five) minutes as needed for chest pain. 25 tablet 3   ONETOUCH VERIO test strip USE TO TEST BLOOD SUGAR UP TO 4 TIMES DAILY 100 strip 5   zinc gluconate 50 MG tablet Take 50 mg by mouth daily.     No current facility-administered medications on file prior to visit.    BP 122/64   Pulse 68   Temp (!) 97.3 F (36.3 C) (Temporal)   Ht 5\' 7"  (1.702 m)   Wt 215 lb (97.5 kg)   SpO2 97%   BMI 33.67 kg/m  Objective:   Physical Exam HENT:     Right Ear: Tympanic membrane and ear canal normal.     Left Ear: Tympanic membrane and ear canal normal.     Nose: Nose normal.  Eyes:      Conjunctiva/sclera: Conjunctivae normal.     Pupils: Pupils are equal, round, and reactive to light.  Neck:     Thyroid: No thyromegaly.  Cardiovascular:     Rate and Rhythm: Normal rate and regular rhythm.     Heart sounds: No murmur heard. Pulmonary:     Effort: Pulmonary effort is normal.     Breath sounds: Normal breath sounds.  No rales.  Abdominal:     General: Bowel sounds are normal.     Palpations: Abdomen is soft.     Tenderness: There is no abdominal tenderness.  Musculoskeletal:        General: Normal range of motion.     Cervical back: Neck supple.  Lymphadenopathy:     Cervical: No cervical adenopathy.  Skin:    General: Skin is warm and dry.     Findings: No rash.  Neurological:     Mental Status: She is alert and oriented to person, place, and time.     Cranial Nerves: No cranial nerve deficit.     Deep Tendon Reflexes: Reflexes are normal and symmetric.  Psychiatric:        Mood and Affect: Mood normal.          Assessment & Plan:      This visit occurred during the SARS-CoV-2 public health emergency.  Safety protocols were in place, including screening questions prior to the visit, additional usage of staff PPE, and extensive cleaning of exam room while observing appropriate contact time as indicated for disinfecting solutions.

## 2021-02-09 NOTE — Patient Instructions (Signed)
Stop by the lab prior to leaving today. I will notify you of your results once received.   Please schedule a follow up visit for 6 months for diabetes check.  It was a pleasure to see you today!  Preventive Care 66 Years and Older, Female Preventive care refers to lifestyle choices and visits with your health care provider that can promote health and wellness. This includes: A yearly physical exam. This is also called an annual wellness visit. Regular dental and eye exams. Immunizations. Screening for certain conditions. Healthy lifestyle choices, such as: Eating a healthy diet. Getting regular exercise. Not using drugs or products that contain nicotine and tobacco. Limiting alcohol use. What can I expect for my preventive care visit? Physical exam Your health care provider will check your: Height and weight. These may be used to calculate your BMI (body mass index). BMI is a measurement that tells if you are at a healthy weight. Heart rate and blood pressure. Body temperature. Skin for abnormal spots. Counseling Your health care provider may ask you questions about your: Past medical problems. Family's medical history. Alcohol, tobacco, and drug use. Emotional well-being. Home life and relationship well-being. Sexual activity. Diet, exercise, and sleep habits. History of falls. Memory and ability to understand (cognition). Work and work Statistician. Pregnancy and menstrual history. Access to firearms. What immunizations do I need?  Vaccines are usually given at various ages, according to a schedule. Your health care provider will recommend vaccines for you based on your age, medicalhistory, and lifestyle or other factors, such as travel or where you work. What tests do I need? Blood tests Lipid and cholesterol levels. These may be checked every 5 years, or more often depending on your overall health. Hepatitis C test. Hepatitis B test. Screening Lung cancer screening.  You may have this screening every year starting at age 66 if you have a 30-pack-year history of smoking and currently smoke or have quit within the past 15 years. Colorectal cancer screening. All adults should have this screening starting at age 66 and continuing until age 71. Your health care provider may recommend screening at age 66 if you are at increased risk. You will have tests every 1-10 years, depending on your results and the type of screening test. Diabetes screening. This is done by checking your blood sugar (glucose) after you have not eaten for a while (fasting). You may have this done every 1-3 years. Mammogram. This may be done every 1-2 years. Talk with your health care provider about how often you should have regular mammograms. Abdominal aortic aneurysm (AAA) screening. You may need this if you are a current or former smoker. BRCA-related cancer screening. This may be done if you have a family history of breast, ovarian, tubal, or peritoneal cancers. Other tests STD (sexually transmitted disease) testing, if you are at risk. Bone density scan. This is done to screen for osteoporosis. You may have this done starting at age 66. Talk with your health care provider about your test results, treatment options,and if necessary, the need for more tests. Follow these instructions at home: Eating and drinking  Eat a diet that includes fresh fruits and vegetables, whole grains, lean protein, and low-fat dairy products. Limit your intake of foods with high amounts of sugar, saturated fats, and salt. Take vitamin and mineral supplements as recommended by your health care provider. Do not drink alcohol if your health care provider tells you not to drink. If you drink alcohol: Limit how much you  have to 0-1 drink a day. Be aware of how much alcohol is in your drink. In the [place] one drink equals one 12 oz bottle of beer (355 mL), one 5 oz glass of wine (148 mL), or one 1 oz glass of  hard liquor (44 mL).  Lifestyle Take daily care of your teeth and gums. Brush your teeth every morning and night with fluoride toothpaste. Floss one time each day. Stay active. Exercise for at least 30 minutes 5 or more days each week. Do not use any products that contain nicotine or tobacco, such as cigarettes, e-cigarettes, and chewing tobacco. If you need help quitting, ask your health care provider. Do not use drugs. If you are sexually active, practice safe sex. Use a condom or other form of protection in order to prevent STIs (sexually transmitted infections). Talk with your health care provider about taking a low-dose aspirin or statin. Find healthy ways to cope with stress, such as: Meditation, yoga, or listening to music. Journaling. Talking to a trusted person. Spending time with friends and family. Safety Always wear your seat belt while driving or riding in a vehicle. Do not drive: If you have been drinking alcohol. Do not ride with someone who has been drinking. When you are tired or distracted. While texting. Wear a helmet and other protective equipment during sports activities. If you have firearms in your house, make sure you follow all gun safety procedures. What's next? Visit your health care provider once a year for an annual wellness visit. Ask your health care provider how often you should have your eyes and teeth checked. Stay up to date on all vaccines. This information is not intended to replace advice given to you by your health care provider. Make sure you discuss any questions you have with your healthcare provider. Document Revised: 07/27/2020 Document Reviewed: 07/31/2018 Elsevier Patient Education  2022 [employer].

## 2021-02-09 NOTE — Assessment & Plan Note (Signed)
S/P stent placement in June 2022. Continue clopidogrel 75 mg, atorvastatin 40 mg, aspirin 81 mg.

## 2021-02-09 NOTE — Assessment & Plan Note (Signed)
Lipid panel from February 2022 reviewed and stable. Continue atorvastatin 40 mg.

## 2021-02-10 NOTE — Addendum Note (Signed)
Addended by: Francella Solian on: 02/10/2021 03:59 PM   Modules accepted: Orders

## 2021-02-28 DIAGNOSIS — H16143 Punctate keratitis, bilateral: Secondary | ICD-10-CM | POA: Diagnosis not present

## 2021-02-28 DIAGNOSIS — H16223 Keratoconjunctivitis sicca, not specified as Sjogren's, bilateral: Secondary | ICD-10-CM | POA: Diagnosis not present

## 2021-02-28 DIAGNOSIS — H04123 Dry eye syndrome of bilateral lacrimal glands: Secondary | ICD-10-CM | POA: Diagnosis not present

## 2021-03-04 ENCOUNTER — Other Ambulatory Visit: Payer: Self-pay | Admitting: Primary Care

## 2021-03-04 DIAGNOSIS — R000 Tachycardia, unspecified: Secondary | ICD-10-CM

## 2021-03-19 ENCOUNTER — Other Ambulatory Visit: Payer: Self-pay | Admitting: Family Medicine

## 2021-03-19 DIAGNOSIS — E1140 Type 2 diabetes mellitus with diabetic neuropathy, unspecified: Secondary | ICD-10-CM

## 2021-03-22 DIAGNOSIS — H2513 Age-related nuclear cataract, bilateral: Secondary | ICD-10-CM | POA: Diagnosis not present

## 2021-03-22 DIAGNOSIS — E113513 Type 2 diabetes mellitus with proliferative diabetic retinopathy with macular edema, bilateral: Secondary | ICD-10-CM | POA: Diagnosis not present

## 2021-04-19 DIAGNOSIS — E113511 Type 2 diabetes mellitus with proliferative diabetic retinopathy with macular edema, right eye: Secondary | ICD-10-CM | POA: Diagnosis not present

## 2021-05-17 DIAGNOSIS — H2513 Age-related nuclear cataract, bilateral: Secondary | ICD-10-CM | POA: Diagnosis not present

## 2021-05-17 DIAGNOSIS — E113513 Type 2 diabetes mellitus with proliferative diabetic retinopathy with macular edema, bilateral: Secondary | ICD-10-CM | POA: Diagnosis not present

## 2021-05-25 DIAGNOSIS — E1140 Type 2 diabetes mellitus with diabetic neuropathy, unspecified: Secondary | ICD-10-CM

## 2021-05-25 MED ORDER — LANCET DEVICES MISC: 1.0000 | 0 refills | Status: DC

## 2021-05-25 NOTE — Telephone Encounter (Signed)
Spoke with [facility], they had no questions regarding the prescription instructions.  The prescription was just on hold as she did not pick it up when it was refilled initially.  The pharmacy staff was able to refill the prescription without any problems.  Patient aware.

## 2021-05-25 NOTE — Telephone Encounter (Signed)
Called pharmacy wanted to verify that she should be on one tab bid. That is what the last refill that they are holding is. However in the past she has been on two tab bid. Does not look like last script was done by you.   I have verified that the lancet will be covered if script is sent in. I have pended in this note.   Left message to return call to our office. On both numbers to let her know we are working on resolving for her.

## 2021-06-14 DIAGNOSIS — H43822 Vitreomacular adhesion, left eye: Secondary | ICD-10-CM | POA: Diagnosis not present

## 2021-06-14 DIAGNOSIS — E113513 Type 2 diabetes mellitus with proliferative diabetic retinopathy with macular edema, bilateral: Secondary | ICD-10-CM | POA: Diagnosis not present

## 2021-06-15 ENCOUNTER — Encounter: Payer: Self-pay | Admitting: Cardiovascular Disease

## 2021-06-15 NOTE — Progress Notes (Signed)
Cardiology Office Note:    Date:  06/16/2021   ID:  Alyssa Stone, DOB Jul 14, 1956, MRN [id number]  PCP:  Pleas Koch, NP  [facility] Cardiologist:  [facility] Electrophysiologist:  None   Referring MD: Pleas Koch, NP   Chief Complaint  Patient presents with   ADD     Jan. 27, 2022   Alyssa Stone is a 65 y.o. female with a hx of  Sinus tachycardia. Hx of DM, HLD  + Fam hx of CAD  We were asked to see her today by Allie Bossier, NP for further evaluation of her sinus tachycardia.  EKGrevealed sinus tachycardia at a rate of 127.  She was started on metoprolol. Blood work revealed a normal TSH of 4.14.  Her white blood cell count was mildly elevated at 11.9.  Her basic metabolic profile was within normal limits with the exception of glucose of 102. Urinalysis revealed cloudy urine.  There was 3+ leukocytes and she was positive for protein.  Nitrates were negative.  Has DOE with climbing stairs.  Does not exercise Cleans housees, does yard work .  No chest pain .   Has occasional exertional chest pressure , has diabetes Had covid a year ago - has had covid twice feb and then thanksgiving  Has not felt well since having covid.    Knows to take vit. C, D, zinc Mother had cad / angina  - treated medically Father had CABG   Feb. 18, 2022: Alyssa Stone is seen today for follow up of her DOE , HLD. Seen with daughter, heather.   Coronary CT angio reveals a coronary calcium score of 0 but she has a non calcified proximal LAD stenosis. She is here to discuss cath. Has continued to have chest heaviness -typically occurs when she is outside working in the yard work walks upstairs. The pain resolves if she stops and rest. Will schedule for cath ASA 81 mg a day  NTG as needed.   Oct. 28, 2022  Alyssa Stone is seen today for follow up of her DOE.  Was found to have significant coronary calcifications Cath on Feb. 2022 showed a proximal LAD stenosis with was  stented She had a distal LAD stenosis that was treated medically  Feeling much better  Is back exercising  Is getting injections into her eyes.  She has been able to continue her DAPT with these eye  injections     Past Medical History:  Diagnosis Date   Allergy    Anemia    CAD (coronary artery disease)    s/p DES to o-pLAD in 09/2020 // dLAD 75 (small, med Rx)   Chicken pox    Depression    Diabetes mellitus    Fainting episodes    Gastroesophageal reflux disease    Hyperlipidemia    controlled om meds    Sinus tachycardia     Past Surgical History:  Procedure Laterality Date   ANAL FISSURE REPAIR     CESAREAN SECTION     CORONARY STENT INTERVENTION N/A 10/14/2020   Procedure: CORONARY STENT INTERVENTION;  Surgeon: Jettie Booze, MD;  Location: [place] [facility];  Service: Cardiovascular;  Laterality: N/A;   INTRAVASCULAR IMAGING/OCT N/A 10/14/2020   Procedure: INTRAVASCULAR IMAGING/OCT;  Surgeon: Jettie Booze, MD;  Location: [place] [facility];  Service: Cardiovascular;  Laterality: N/A;   INTRAVASCULAR PRESSURE WIRE/FFR STUDY N/A 10/14/2020   Procedure: INTRAVASCULAR PRESSURE WIRE/FFR STUDY;  Surgeon: Jettie Booze,  MD;  Location: [place] [facility];  Service: Cardiovascular;  Laterality: N/A;   LEFT HEART CATH AND CORONARY ANGIOGRAPHY N/A 10/14/2020   Procedure: LEFT HEART CATH AND CORONARY ANGIOGRAPHY;  Surgeon: Jettie Booze, MD;  Location: [place] [facility];  Service: Cardiovascular;  Laterality: N/A;   TONSILLECTOMY Bilateral     Current Medications: Current Meds  Medication Sig   Ascorbic Acid (VITAMIN C) 1000 MG tablet Take 1,000 mg by mouth daily.   aspirin EC 81 MG tablet Take 1 tablet (81 mg total) by mouth daily. Swallow whole.   atorvastatin (LIPITOR) 40 MG tablet TAKE 1 TABLET BY MOUTH EVERY EVENING FOR CHOLESTEROL   clopidogrel (PLAVIX) 75 MG tablet Take 1 tablet (75 mg total) by mouth daily.   Cyanocobalamin (VITAMIN B-12)  5000 MCG SUBL Place 5,000 mcg under the tongue daily.   glipiZIDE (GLUCOTROL) 5 MG tablet TAKE 1 TABLET (5 MG TOTAL) BY MOUTH 2 (TWO) TIMES DAILY BEFORE A MEAL. FOR DIABETES.   insulin glargine (LANTUS SOLOSTAR) 100 UNIT/ML Solostar Pen Inject 20 Units into the skin every evening. For diabetes   Insulin Pen Needle (B-D ULTRAFINE III SHORT PEN) 31G X 8 MM MISC USE NIGHTLY WITH INSULIN PEN FOR DIABETES.   Lancet Devices MISC 1 each by Does not apply route as directed. One touch delicate lancet   Lancets (ONETOUCH DELICA PLUS [id number]) MISC USE TO TEST BLOOD SUGAR UP TO 4 TIMES A DAY   metFORMIN (GLUCOPHAGE-XR) 500 MG 24 hr tablet Take 1 tablet (500 mg total) by mouth 2 (two) times daily. One tab bid   metoprolol succinate (TOPROL-XL) 25 MG 24 hr tablet TAKE 1 TABLET BY MOUTH DAILY. FOR HEART RATE.   nitroGLYCERIN (NITROSTAT) 0.4 MG SL tablet Place 1 tablet (0.4 mg total) under the tongue every 5 (five) minutes as needed for chest pain.   ONETOUCH VERIO test strip USE TO TEST BLOOD SUGAR UP TO 4 TIMES DAILY   zinc gluconate 50 MG tablet Take 50 mg by mouth daily.     Allergies:   Patient has no known allergies.   Social History   Socioeconomic History   Marital status: Divorced    Spouse name: Not on file   Number of children: 3   Years of education: Not on file   Highest education level: 12th grade  Occupational History   Not on file  Tobacco Use   Smoking status: Former   Smokeless tobacco: Never  Substance and Sexual Activity   Alcohol use: Not Currently   Drug use: Never   Sexual activity: Not Currently  Other Topics Concern   Not on file  Social History Narrative   Not on file   Social Determinants of Health   Financial Resource Strain: Not on file  Food Insecurity: Not on file  Transportation Needs: Not on file  Physical Activity: Not on file  Stress: Not on file  Social Connections: Not on file     Family History: The patient's family history includes Cancer in  her mother and sister; Diabetes in her brother, mother, and sister; Heart attack in her brother and father; Hyperlipidemia in her brother; Hypertension in her father; Kidney disease in her brother; Miscarriages / [place] in her mother. There is no history of Colon cancer, Colon polyps, Esophageal cancer, Rectal cancer, or Stomach cancer.  ROS:   Please see the history of present illness.     All other systems reviewed and are negative.  EKGs/Labs/Other Studies Reviewed:  The following studies were reviewed today:   EKG:      Recent Labs: 08/10/2020: TSH 4.14 10/07/2020: ALT 14; BUN 12; Creatinine, Ser 0.51; Hemoglobin 13.2; Platelets 377; Potassium 4.8; Sodium 138  Recent Lipid Panel    Component Value Date/Time   CHOL 127 10/07/2020 1016   TRIG 113 10/07/2020 1016   HDL 42 10/07/2020 1016   CHOLHDL 3.0 10/07/2020 1016   CHOLHDL 4 10/15/2019 0955   VLDL 31.8 10/15/2019 0955   LDLCALC 64 10/07/2020 1016   LDLDIRECT 137.0 09/03/2019 1225     Risk Assessment/Calculations:       Physical Exam:    Physical Exam: Blood pressure 128/76, pulse 89, height 5\' 7"  (1.702 m), weight 214 lb 3.7 oz (97.2 kg), SpO2 98 %.  GEN:  Well nourished, well developed in no acute distress HEENT: Normal NECK: No JVD; No carotid bruits LYMPHATICS: No lymphadenopathy CARDIAC: RRR , no murmurs, rubs, gallops RESPIRATORY:  Clear to auscultation without rales, wheezing or rhonchi  ABDOMEN: Soft, non-tender, non-distended MUSCULOSKELETAL:  No edema; No deformity  SKIN: Warm and dry NEUROLOGIC:  Alert and oriented x 3    ASSESSMENT:    1. Mixed hyperlipidemia   2. CAD in native artery     PLAN:      Sinus tach:    Better on Toprol   2.   Coronary artery disease:  s/p stenting of prox LAD.   She has a distal LAD  We will continue aggressive approach to her lipids.  Her LDL goal is now less than 70.  She is exercising without any difficulty.  Will see her in 1 year        Medication Adjustments/Labs and Tests Ordered: Current medicines are reviewed at length with the patient today.  Concerns regarding medicines are outlined above.  Orders Placed This Encounter  Procedures   Lipid panel   ALT   Basic metabolic panel    No orders of the defined types were placed in this encounter.    Patient Instructions  Medication Instructions:  Your physician recommends that you continue on your current medications as directed. Please refer to the Current Medication list given to you today.  *If you need a refill on your cardiac medications before your next appointment, please call your pharmacy*   Lab Work: TODAY: Lipid panel, ALT, BMP If you have labs (blood work) drawn today and your tests are completely normal, you will receive your results only by: [place] (if you have MyChart) OR A paper copy in the mail If you have any lab test that is abnormal or we need to change your treatment, we will call you to review the results.   Testing/Procedures: NONE   Follow-Up: At [facility], you and your health needs are our priority.  As part of our continuing mission to provide you with exceptional heart care, we have created designated Provider Care Teams.  These Care Teams include your primary Cardiologist (physician) and Advanced Practice Providers (APPs -  Physician Assistants and Nurse Practitioners) who all work together to provide you with the care you need, when you need it.   Your next appointment:   1 year(s)  The format for your next appointment:   In Person  Provider:   You may see Mertie Moores, MD or one of the following Advanced Practice Providers on your designated Care Team:   Richardson Dopp, PA-C Robbie Lis, [place]     Signed, Mertie Moores, MD  06/16/2021 8:54 AM  [facility]

## 2021-06-16 ENCOUNTER — Other Ambulatory Visit: Payer: Self-pay

## 2021-06-16 ENCOUNTER — Encounter: Payer: Self-pay | Admitting: Cardiovascular Disease

## 2021-06-16 ENCOUNTER — Ambulatory Visit: Payer: PPO | Admitting: Cardiovascular Disease

## 2021-06-16 VITALS — BP 128/76 | HR 89 | Ht 67.0 in | Wt 214.2 lb

## 2021-06-16 DIAGNOSIS — E782 Mixed hyperlipidemia: Secondary | ICD-10-CM | POA: Diagnosis not present

## 2021-06-16 DIAGNOSIS — I2510 Atherosclerotic heart disease of native coronary artery without angina pectoris: Secondary | ICD-10-CM

## 2021-06-16 NOTE — Patient Instructions (Signed)
Medication Instructions:  Your physician recommends that you continue on your current medications as directed. Please refer to the Current Medication list given to you today.  *If you need a refill on your cardiac medications before your next appointment, please call your pharmacy*   Lab Work: TODAY: Lipid panel, ALT, BMP If you have labs (blood work) drawn today and your tests are completely normal, you will receive your results only by: [place] (if you have MyChart) OR A paper copy in the mail If you have any lab test that is abnormal or we need to change your treatment, we will call you to review the results.   Testing/Procedures: NONE   Follow-Up: At [facility], you and your health needs are our priority.  As part of our continuing mission to provide you with exceptional heart care, we have created designated Provider Care Teams.  These Care Teams include your primary Cardiologist (physician) and Advanced Practice Providers (APPs -  Physician Assistants and Nurse Practitioners) who all work together to provide you with the care you need, when you need it.   Your next appointment:   1 year(s)  The format for your next appointment:   In Person  Provider:   You may see Mertie Moores, MD or one of the following Advanced Practice Providers on your designated Care Team:   Richardson Dopp, PA-C [place], [place]

## 2021-06-17 LAB — BASIC METABOLIC PANEL
BUN/Creatinine Ratio: 21 (ref 12–28)
BUN: 12 mg/dL (ref 8–27)
CO2: 24 mmol/L (ref 20–29)
Calcium: 10 mg/dL (ref 8.7–10.3)
Chloride: 101 mmol/L (ref 96–106)
Creatinine, Ser: 0.56 mg/dL — ABNORMAL LOW (ref 0.57–1.00)
Glucose: 80 mg/dL (ref 70–99)
Potassium: 4.5 mmol/L (ref 3.5–5.2)
Sodium: 142 mmol/L (ref 134–144)
eGFR: 101 mL/min/{1.73_m2} (ref >59)

## 2021-06-17 LAB — LIPID PANEL
Chol/HDL Ratio: 3.7 ratio (ref 0.0–4.4)
Cholesterol, Total: 119 mg/dL (ref 100–199)
HDL: 32 mg/dL — ABNORMAL LOW (ref >39)
LDL Chol Calc (NIH): 58 mg/dL (ref 0–99)
Triglycerides: 171 mg/dL — ABNORMAL HIGH (ref 0–149)
VLDL Cholesterol Cal: 29 mg/dL (ref 5–40)

## 2021-06-17 LAB — ALT: ALT: 10 IU/L (ref 0–32)

## 2021-08-11 ENCOUNTER — Ambulatory Visit: Payer: PPO | Admitting: Primary Care

## 2021-08-11 ENCOUNTER — Other Ambulatory Visit: Payer: Self-pay | Admitting: Primary Care

## 2021-08-11 DIAGNOSIS — E1140 Type 2 diabetes mellitus with diabetic neuropathy, unspecified: Secondary | ICD-10-CM

## 2021-08-22 ENCOUNTER — Other Ambulatory Visit: Payer: Self-pay | Admitting: Primary Care

## 2021-08-22 DIAGNOSIS — E1140 Type 2 diabetes mellitus with diabetic neuropathy, unspecified: Secondary | ICD-10-CM

## 2021-09-03 ENCOUNTER — Other Ambulatory Visit: Payer: Self-pay | Admitting: Primary Care

## 2021-09-03 DIAGNOSIS — E785 Hyperlipidemia, unspecified: Secondary | ICD-10-CM

## 2021-09-14 ENCOUNTER — Ambulatory Visit: Payer: PPO | Admitting: Primary Care

## 2021-10-02 ENCOUNTER — Other Ambulatory Visit: Payer: Self-pay | Admitting: Primary Care

## 2021-10-02 DIAGNOSIS — E1140 Type 2 diabetes mellitus with diabetic neuropathy, unspecified: Secondary | ICD-10-CM

## 2021-10-05 ENCOUNTER — Encounter: Payer: Self-pay | Admitting: Primary Care

## 2021-10-05 ENCOUNTER — Other Ambulatory Visit: Payer: Self-pay

## 2021-10-05 ENCOUNTER — Ambulatory Visit (INDEPENDENT_AMBULATORY_CARE_PROVIDER_SITE_OTHER): Payer: PPO | Admitting: Primary Care

## 2021-10-05 VITALS — BP 102/58 | HR 98 | Temp 98.6°F | Ht 67.0 in | Wt 213.0 lb

## 2021-10-05 DIAGNOSIS — E1140 Type 2 diabetes mellitus with diabetic neuropathy, unspecified: Secondary | ICD-10-CM

## 2021-10-05 LAB — POCT GLYCOSYLATED HEMOGLOBIN (HGB A1C): Hemoglobin A1C: 7.9 % — AB (ref 4.0–5.6)

## 2021-10-05 NOTE — Assessment & Plan Note (Addendum)
Increased since last visit with A1C of 7.9 today.  Suspect her poor diet over the holidays has largely impacted her A1C. She is motivated to continue with her efforts for improvement. She does not want to increase   Continue Lantus 20 units daily. Continue Metformin XR 500 mg BID. Continue Glipizide 5 mg BID.  Follow up in July for CPE. She will notify if glucose readings to not continue to reduce despite her regimen.

## 2021-10-05 NOTE — Progress Notes (Signed)
Subjective:    Patient ID: Alyssa Stone, female    DOB: 11-10-1955, 66 y.o.   MRN: [id number]  HPI  Alyssa Stone is a very pleasant 66 y.o. female with a history of type 2 diabetes, CAD, anemia, hyperlipidemia, sinus tachycardia who presents today for follow up of diabetes.  Current medications include: Metformin XR 500 mg BID, Glipizide 5 mg BID, Lantus 20 units (for 2 months)  She is checking her blood glucose 1 times daily and is getting readings ranging mid 100's.   Last A1C: 7.1 in June 2022, 7.9 today Last Eye Exam: Follows regularly  Last Foot Exam:  UTD Pneumonia Vaccination: 2022 Urine Microalbumin: UTD Statin: atorvastatin   Dietary changes since last visit: Poor diet during November 2022 and December 2022. Limiting sugar and carbs.   Exercise: No regular exercise    BP Readings from Last 3 Encounters:  10/05/21 (!) 102/58  06/16/21 128/76  02/09/21 122/64      Review of Systems  Respiratory:  Negative for shortness of breath.   Cardiovascular:  Negative for chest pain.  Neurological:  Negative for dizziness, numbness and headaches.        Past Medical History:  Diagnosis Date   Allergy    Anemia    CAD (coronary artery disease)    s/p DES to o-pLAD in 09/2020 // dLAD 75 (small, med Rx)   Chicken pox    Depression    Diabetes mellitus    Fainting episodes    Gastroesophageal reflux disease    Hyperlipidemia    controlled om meds    Sinus tachycardia     Social History   Socioeconomic History   Marital status: Divorced    Spouse name: Not on file   Number of children: 3   Years of education: Not on file   Highest education level: 12th grade  Occupational History   Not on file  Tobacco Use   Smoking status: Former   Smokeless tobacco: Never  Substance and Sexual Activity   Alcohol use: Not Currently   Drug use: Never   Sexual activity: Not Currently  Other Topics Concern   Not on file  Social History Narrative   Not on  file   Social Determinants of Health   Financial Resource Strain: Not on file  Food Insecurity: Not on file  Transportation Needs: Not on file  Physical Activity: Not on file  Stress: Not on file  Social Connections: Not on file  Intimate Partner Violence: Not on file    Past Surgical History:  Procedure Laterality Date   ANAL FISSURE REPAIR     CESAREAN SECTION     CORONARY STENT INTERVENTION N/A 10/14/2020   Procedure: CORONARY STENT INTERVENTION;  Surgeon: Jettie Booze, MD;  Location: [place] [facility];  Service: Cardiovascular;  Laterality: N/A;   INTRAVASCULAR IMAGING/OCT N/A 10/14/2020   Procedure: INTRAVASCULAR IMAGING/OCT;  Surgeon: Jettie Booze, MD;  Location: [place] [facility];  Service: Cardiovascular;  Laterality: N/A;   INTRAVASCULAR PRESSURE WIRE/FFR STUDY N/A 10/14/2020   Procedure: INTRAVASCULAR PRESSURE WIRE/FFR STUDY;  Surgeon: Jettie Booze, MD;  Location: [place] [facility];  Service: Cardiovascular;  Laterality: N/A;   LEFT HEART CATH AND CORONARY ANGIOGRAPHY N/A 10/14/2020   Procedure: LEFT HEART CATH AND CORONARY ANGIOGRAPHY;  Surgeon: Jettie Booze, MD;  Location: [place] [facility];  Service: Cardiovascular;  Laterality: N/A;   TONSILLECTOMY Bilateral     Family History  Problem Relation  Age of Onset   Cancer Mother    Diabetes Mother    Miscarriages / [place] Mother    Hypertension Father    Heart attack Father    Cancer Sister    Diabetes Sister    Diabetes Brother    Heart attack Brother    Kidney disease Brother    Hyperlipidemia Brother    Colon cancer Neg Hx    Colon polyps Neg Hx    Esophageal cancer Neg Hx    Rectal cancer Neg Hx    Stomach cancer Neg Hx     No Known Allergies  Current Outpatient Medications on File Prior to Visit  Medication Sig Dispense Refill   Ascorbic Acid (VITAMIN C) 1000 MG tablet Take 1,000 mg by mouth daily.     aspirin EC 81 MG tablet Take 1 tablet (81 mg total) by mouth  daily. Swallow whole. 90 tablet 3   atorvastatin (LIPITOR) 40 MG tablet TAKE 1 TABLET BY MOUTH EVERY DAY IN THE EVENING FOR CHOLESTEROL 90 tablet 1   B-D ULTRAFINE III SHORT PEN 31G X 8 MM MISC USE NIGHTLY WITH INSULIN PEN FOR DIABETES. 100 each 3   clopidogrel (PLAVIX) 75 MG tablet Take 1 tablet (75 mg total) by mouth daily. 90 tablet 3   Cyanocobalamin (VITAMIN B-12) 5000 MCG SUBL Place 5,000 mcg under the tongue daily.     glipiZIDE (GLUCOTROL) 5 MG tablet Take 1 tablet (5 mg total) by mouth 2 (two) times daily before a meal. For diabetes. Office visit required for further refills. 180 tablet 0   insulin glargine (LANTUS SOLOSTAR) 100 UNIT/ML Solostar Pen Inject 20 Units into the skin every evening. For diabetes 15 mL 5   Lancet Devices MISC 1 each by Does not apply route as directed. One touch delicate lancet 1 each 0   Lancets (ONETOUCH DELICA PLUS [id number]) MISC USE TO TEST BLOOD SUGAR UP TO 4 TIMES A DAY 100 each 5   Magnesium 250 MG TABS      metFORMIN (GLUCOPHAGE-XR) 500 MG 24 hr tablet Take 1 tablet (500 mg total) by mouth 2 (two) times daily. One tab bid 180 tablet 1   metoprolol succinate (TOPROL-XL) 25 MG 24 hr tablet TAKE 1 TABLET BY MOUTH DAILY. FOR HEART RATE. 90 tablet 3   nitroGLYCERIN (NITROSTAT) 0.4 MG SL tablet Place 1 tablet (0.4 mg total) under the tongue every 5 (five) minutes as needed for chest pain. 25 tablet 3   ONETOUCH VERIO test strip USE TO TEST BLOOD SUGAR UP TO 4 TIMES DAILY 100 strip 5   zinc gluconate 50 MG tablet Take 50 mg by mouth daily.     tobramycin (TOBREX) 0.3 % ophthalmic solution PLEASE SEE ATTACHED FOR DETAILED DIRECTIONS     No current facility-administered medications on file prior to visit.    BP (!) 102/58    Pulse 98    Temp 98.6 F (37 C) (Temporal)    Ht 5\' 7"  (1.702 m)    Wt 213 lb (96.6 kg)    SpO2 99%    BMI 33.36 kg/m  Objective:   Physical Exam Cardiovascular:     Rate and Rhythm: Normal rate and regular rhythm.  Pulmonary:      Effort: Pulmonary effort is normal.     Breath sounds: Normal breath sounds.  Musculoskeletal:     Cervical back: Neck supple.  Skin:    General: Skin is warm and dry.  Psychiatric:  Mood and Affect: Mood normal.          Assessment & Plan:      This visit occurred during the SARS-CoV-2 public health emergency.  Safety protocols were in place, including screening questions prior to the visit, additional usage of staff PPE, and extensive cleaning of exam room while observing appropriate contact time as indicated for disinfecting solutions.

## 2021-10-05 NOTE — Patient Instructions (Signed)
Continue Lantus 20 units daily, glipizide 5 mg twice daily, and metformin 500 mg twice daily for diabetes.  Continue to work on [employer].   Start checking your blood sugar levels at different times of the day.  Appropriate times to check your blood sugar levels are:  -Before any meal (breakfast, lunch, dinner) -Two hours after any meal (breakfast, lunch, dinner) -Bedtime  Record your readings and notify me if you continue to consistently run at or above 150.  It was a pleasure to see you today!

## 2021-11-02 NOTE — Telephone Encounter (Signed)
Called patient appointment made for in person. Will call back if any questions.  ?

## 2021-11-02 NOTE — Telephone Encounter (Signed)
Joellen, can you set up a virtual or in person visit for this patient who wants to discuss changing her diabetes treatment? See message. ?

## 2021-11-06 ENCOUNTER — Other Ambulatory Visit: Payer: Self-pay

## 2021-11-06 ENCOUNTER — Telehealth: Payer: Self-pay

## 2021-11-06 MED ORDER — CLOPIDOGREL BISULFATE 75 MG PO TABS
75.0000 mg | ORAL_TABLET | Freq: Every day | ORAL | 3 refills | Status: DC
  Filled 2022-04-25: qty 90, 90d supply, fill #0
  Filled 2022-07-26: qty 90, 90d supply, fill #1

## 2021-11-06 NOTE — Telephone Encounter (Signed)
Will forward to Dr. Acie Fredrickson. Patient had cath in 09/2020. ?

## 2021-11-06 NOTE — Telephone Encounter (Signed)
Pharmacy requesting a refill on clopidogrel. This medication was D/C off of pt's medication list. Does Dr. Acie Fredrickson still want pt to take clopidogrel? Please address ?

## 2021-11-06 NOTE — Telephone Encounter (Signed)
Called patient back about message. Informed patient that per Dr. Acie Fredrickson that she should be on Plavix or Aspirin, but he prefers her to be on Plavix. Patient stated she is on both and she will continue to take both for right now.  ?

## 2021-11-07 ENCOUNTER — Other Ambulatory Visit: Payer: Self-pay | Admitting: Primary Care

## 2021-11-07 DIAGNOSIS — E1140 Type 2 diabetes mellitus with diabetic neuropathy, unspecified: Secondary | ICD-10-CM

## 2021-11-08 NOTE — Addendum Note (Signed)
Addended by: Aris Georgia, Aissata Wilmore L on: 11/08/2021 04:41 PM ? ? Modules accepted: Orders ? ?

## 2021-11-08 NOTE — Telephone Encounter (Signed)
Patient returned call. Transferred to RN.  ?

## 2021-11-08 NOTE — Telephone Encounter (Signed)
It has been over a year since her PCI.  ?  ?She does not need dual anti-platelet therapy  ?She should just be on 1 anti-platelet med and I would prefer that to be Plavix 75 mg a day   ?Plavix + ASA at this point will only increase her risk of bleeding   ?Please ask her to take Plavix 75 mg a day.  There is no need for ASA at this point   ?If she ever gets another stent, she will take ASA and Plavix for a year ( just like the last stent)   ?  ?Thanks  ?  ?PN  ? ?Called patient with Dr. Elmarie Shiley advisement. Patient verbalized understanding. ?

## 2021-11-08 NOTE — Telephone Encounter (Signed)
Left message for patient to call back  

## 2021-11-10 ENCOUNTER — Other Ambulatory Visit: Payer: Self-pay | Admitting: Family Medicine

## 2021-11-10 ENCOUNTER — Other Ambulatory Visit: Payer: Self-pay | Admitting: Primary Care

## 2021-11-10 DIAGNOSIS — Z1231 Encounter for screening mammogram for malignant neoplasm of breast: Secondary | ICD-10-CM

## 2021-11-10 DIAGNOSIS — E1140 Type 2 diabetes mellitus with diabetic neuropathy, unspecified: Secondary | ICD-10-CM

## 2021-11-17 ENCOUNTER — Ambulatory Visit (INDEPENDENT_AMBULATORY_CARE_PROVIDER_SITE_OTHER): Payer: PPO | Admitting: Primary Care

## 2021-11-17 ENCOUNTER — Encounter: Payer: Self-pay | Admitting: Primary Care

## 2021-11-17 DIAGNOSIS — E113513 Type 2 diabetes mellitus with proliferative diabetic retinopathy with macular edema, bilateral: Secondary | ICD-10-CM

## 2021-11-17 DIAGNOSIS — Z794 Long term (current) use of insulin: Secondary | ICD-10-CM

## 2021-11-17 MED ORDER — TIRZEPATIDE 2.5 MG/0.5ML ~~LOC~~ SOAJ: 2.5000 mg | SUBCUTANEOUS | 0 refills | Status: DC

## 2021-11-17 NOTE — Patient Instructions (Signed)
Start Mounjaro for diabetes treatment. Inject 2.5 mg into the skin once weekly. ? ?Please contact me once you have used your last pen so that I can increase your dose to 5 mg. ? ?Also reach out to me if you experience any complications, if your glucose levels start dropping consistently below 90. ? ?It was a pleasure to see you today! ? ?

## 2021-11-17 NOTE — Progress Notes (Signed)
? ?Subjective:  ? ? Patient ID: Alyssa Stone, female    DOB: 1956-08-17, 66 y.o.   MRN: [id number] ? ?HPI ? ?Alyssa Stone is a very pleasant 65 y.o. female with a history of type 2 diabetes, CAD, anemia, hyperlipidemia who presents today to discuss diabetes treatment. ? ?Currently managed on metformin XR 500 mg twice daily, glipizide 5 mg twice daily, Lantus insulin 20 units every evening. ? ?She is undergoing steroid eye injections bilaterally for proliferative diabetic retinopathy with macular edema. She's been on injections for nearly one year.  ? ?She's noticed an increase in her glucose readings which are ranging 130-170's.  ? ?She is interested in starting [facility] for diabetes treatment as her friend has seen great results.  Her goal is to discontinue insulin at some point. ? ? ?Review of Systems  ?Eyes:  Positive for visual disturbance.  ?Respiratory:  Negative for shortness of breath.   ?Cardiovascular:  Negative for chest pain.  ?Neurological:  Negative for numbness.  ? ?   ? ? ?Past Medical History:  ?Diagnosis Date  ? Allergy   ? Anemia   ? CAD (coronary artery disease)   ? s/p DES to o-pLAD in 09/2020 // dLAD 75 (small, med Rx)  ? Chicken pox   ? Depression   ? Diabetes mellitus   ? Fainting episodes   ? Gastroesophageal reflux disease   ? Hyperlipidemia   ? controlled om meds   ? Sinus tachycardia   ? ? ?Social History  ? ?Socioeconomic History  ? Marital status: Divorced  ?  Spouse name: Not on file  ? Number of children: 3  ? Years of education: Not on file  ? Highest education level: 12th grade  ?Occupational History  ? Not on file  ?Tobacco Use  ? Smoking status: Former  ? Smokeless tobacco: Never  ?Substance and Sexual Activity  ? Alcohol use: Not Currently  ? Drug use: Never  ? Sexual activity: Not Currently  ?Other Topics Concern  ? Not on file  ?Social History Narrative  ? Not on file  ? ?Social Determinants of Health  ? ?Financial Resource Strain: Not on file  ?Food Insecurity: Not on  file  ?Transportation Needs: Not on file  ?Physical Activity: Not on file  ?Stress: Not on file  ?Social Connections: Not on file  ?Intimate Partner Violence: Not on file  ? ? ?Past Surgical History:  ?Procedure Laterality Date  ? ANAL FISSURE REPAIR    ? CESAREAN SECTION    ? CORONARY STENT INTERVENTION N/A 10/14/2020  ? Procedure: CORONARY STENT INTERVENTION;  Surgeon: Jettie Booze, MD;  Location: [place] [facility];  Service: Cardiovascular;  Laterality: N/A;  ? INTRAVASCULAR IMAGING/OCT N/A 10/14/2020  ? Procedure: INTRAVASCULAR IMAGING/OCT;  Surgeon: Jettie Booze, MD;  Location: [place] [facility];  Service: Cardiovascular;  Laterality: N/A;  ? INTRAVASCULAR PRESSURE WIRE/FFR STUDY N/A 10/14/2020  ? Procedure: INTRAVASCULAR PRESSURE WIRE/FFR STUDY;  Surgeon: Jettie Booze, MD;  Location: [place] [facility];  Service: Cardiovascular;  Laterality: N/A;  ? LEFT HEART CATH AND CORONARY ANGIOGRAPHY N/A 10/14/2020  ? Procedure: LEFT HEART CATH AND CORONARY ANGIOGRAPHY;  Surgeon: Jettie Booze, MD;  Location: [place] [facility];  Service: Cardiovascular;  Laterality: N/A;  ? TONSILLECTOMY Bilateral   ? ? ?Family History  ?Problem Relation Age of Onset  ? Cancer Mother   ? Diabetes Mother   ? Miscarriages / [place] Mother   ? Hypertension Father   ?  Heart attack Father   ? Cancer Sister   ? Diabetes Sister   ? Diabetes Brother   ? Heart attack Brother   ? Kidney disease Brother   ? Hyperlipidemia Brother   ? Colon cancer Neg Hx   ? Colon polyps Neg Hx   ? Esophageal cancer Neg Hx   ? Rectal cancer Neg Hx   ? Stomach cancer Neg Hx   ? ? ?No Known Allergies ? ?Current Outpatient Medications on File Prior to Visit  ?Medication Sig Dispense Refill  ? Ascorbic Acid (VITAMIN C) 1000 MG tablet Take 1,000 mg by mouth daily.    ? atorvastatin (LIPITOR) 40 MG tablet TAKE 1 TABLET BY MOUTH EVERY DAY IN THE EVENING FOR CHOLESTEROL 90 tablet 1  ? B-D ULTRAFINE III SHORT PEN 31G X 8 MM MISC USE NIGHTLY  WITH INSULIN PEN FOR DIABETES. 100 each 3  ? clopidogrel (PLAVIX) 75 MG tablet Take 1 tablet (75 mg total) by mouth daily. 90 tablet 3  ? Cyanocobalamin (VITAMIN B-12) 5000 MCG SUBL Place 5,000 mcg under the tongue daily.    ? glipiZIDE (GLUCOTROL) 5 MG tablet Take 1 tablet (5 mg total) by mouth 2 (two) times daily before a meal. for diabetes. 180 tablet 1  ? insulin glargine (LANTUS SOLOSTAR) 100 UNIT/ML Solostar Pen INJECT 20 UNITS INTO THE SKIN EVERY EVENING. FOR DIABETES 15 mL 1  ? Lancet Devices MISC 1 each by Does not apply route as directed. One touch delicate lancet 1 each 0  ? Lancets (ONETOUCH DELICA PLUS [id number]) MISC USE TO TEST BLOOD SUGAR UP TO 4 TIMES A DAY 100 each 5  ? Magnesium 250 MG TABS     ? metFORMIN (GLUCOPHAGE-XR) 500 MG 24 hr tablet Take 1 tablet (500 mg total) by mouth 2 (two) times daily. for diabetes. 180 tablet 1  ? metoprolol succinate (TOPROL-XL) 25 MG 24 hr tablet TAKE 1 TABLET BY MOUTH DAILY. FOR HEART RATE. 90 tablet 3  ? nitroGLYCERIN (NITROSTAT) 0.4 MG SL tablet Place 1 tablet (0.4 mg total) under the tongue every 5 (five) minutes as needed for chest pain. 25 tablet 3  ? ONETOUCH VERIO test strip USE TO TEST BLOOD SUGAR UP TO 4 TIMES DAILY 100 strip 5  ? tobramycin (TOBREX) 0.3 % ophthalmic solution PLEASE SEE ATTACHED FOR DETAILED DIRECTIONS    ? zinc gluconate 50 MG tablet Take 50 mg by mouth daily.    ? ?No current facility-administered medications on file prior to visit.  ? ? ?BP (!) 142/86   Pulse 76   Ht '5\' 7"'$  (1.702 m)   Wt 216 lb (98 kg)   SpO2 97%   BMI 33.83 kg/m?  ?Objective:  ? Physical Exam ?Cardiovascular:  ?   Rate and Rhythm: Normal rate and regular rhythm.  ?Pulmonary:  ?   Effort: Pulmonary effort is normal.  ?   Breath sounds: Normal breath sounds.  ?Musculoskeletal:  ?   Cervical back: Neck supple.  ?Skin: ?   General: Skin is warm and dry.  ? ? ? ? ? ?   ?Assessment & Plan:  ? ? ? ? ?This visit occurred during the SARS-CoV-2 public health emergency.   Safety protocols were in place, including screening questions prior to the visit, additional usage of staff PPE, and extensive cleaning of exam room while observing appropriate contact time as indicated for disinfecting solutions.  ?

## 2021-11-17 NOTE — Assessment & Plan Note (Addendum)
Agree to add Mounjaro.  We will start at 2.5 mg weekly x4 weeks, then increase to 5 mg weekly thereafter.  Discussed common side effects of Mounjaro. ? ?Continue Lantus 20 units nightly for now.  We may need to reduce her dose when she starts [facility], she will update regarding that. ? ?Continue glipizide 5 mg twice daily, metformin XR 500 mg twice daily. ? ?We will see her in July for follow-up.  She will update sooner via MyChart. ?

## 2021-11-19 ENCOUNTER — Other Ambulatory Visit: Payer: Self-pay

## 2021-11-19 DIAGNOSIS — E113513 Type 2 diabetes mellitus with proliferative diabetic retinopathy with macular edema, bilateral: Secondary | ICD-10-CM

## 2021-11-23 ENCOUNTER — Other Ambulatory Visit: Payer: Self-pay

## 2021-11-23 NOTE — Telephone Encounter (Signed)
Did you receive a PA for [facility]? ?

## 2021-11-25 NOTE — Telephone Encounter (Addendum)
Will need to call insurance to verify information was received will not process on cover my meds.  ? ? ?

## 2021-11-30 NOTE — Telephone Encounter (Signed)
Any updates? 

## 2021-12-01 NOTE — Telephone Encounter (Signed)
Called insurance there was issue and was not submitted on cover my meds. I have started request over the phone and should get answer in 72 business hours.  ? ?Reference # [id number] ?

## 2021-12-01 NOTE — Telephone Encounter (Signed)
Received message can not process need to call insurance will attempt to call today.  ?

## 2021-12-15 NOTE — Telephone Encounter (Signed)
Message sent to patient that you are out of office this week.  ?

## 2021-12-19 MED ORDER — OZEMPIC (0.25 OR 0.5 MG/DOSE) 2 MG/1.5ML ~~LOC~~ SOPN: PEN_INJECTOR | SUBCUTANEOUS | 0 refills | Status: DC

## 2021-12-19 NOTE — Assessment & Plan Note (Signed)
Alyssa Stone is cost prohibitive. ? ?Prescription for Ozempic 0.25/0.5 mg sent to pharmacy.  She will inject 0.25 mg weekly x4 weeks, then increase to 0.5 mg weekly thereafter. ? ?See my chart message encounter. ?

## 2021-12-19 NOTE — Addendum Note (Signed)
Addended by: Pleas Koch on: 12/19/2021 05:56 PM ? ? Modules accepted: Orders ? ?

## 2021-12-28 ENCOUNTER — Ambulatory Visit
Admission: RE | Admit: 2021-12-28 | Discharge: 2021-12-28 | Disposition: A | Discharge status: Home / Self Care | Payer: PPO | Source: Ambulatory Visit | Attending: Primary Care | Admitting: Primary Care

## 2021-12-28 DIAGNOSIS — Z1231 Encounter for screening mammogram for malignant neoplasm of breast: Secondary | ICD-10-CM

## 2022-01-06 ENCOUNTER — Other Ambulatory Visit: Payer: Self-pay | Admitting: Family Medicine

## 2022-01-06 DIAGNOSIS — Z794 Long term (current) use of insulin: Secondary | ICD-10-CM

## 2022-01-21 ENCOUNTER — Other Ambulatory Visit: Payer: Self-pay | Admitting: Primary Care

## 2022-01-21 DIAGNOSIS — E785 Hyperlipidemia, unspecified: Secondary | ICD-10-CM

## 2022-01-21 DIAGNOSIS — R000 Tachycardia, unspecified: Secondary | ICD-10-CM

## 2022-02-07 ENCOUNTER — Ambulatory Visit (INDEPENDENT_AMBULATORY_CARE_PROVIDER_SITE_OTHER): Payer: PPO

## 2022-02-07 VITALS — Ht 67.0 in | Wt 212.0 lb

## 2022-02-07 DIAGNOSIS — Z0000 Encounter for general adult medical examination without abnormal findings: Secondary | ICD-10-CM | POA: Diagnosis not present

## 2022-02-07 NOTE — Progress Notes (Signed)
Subjective:   Alyssa Stone is a 66 y.o. female who presents for Medicare Annual (Subsequent) preventive examination.  Review of Systems    Virtual Visit via Telephone Note  I connected with  Alyssa Stone on 02/07/22 at  1:00 PM EDT by telephone and verified that I am speaking with the correct person using two identifiers.  Location: Patient: Home Provider: Office Persons participating in the virtual visit: patient/Nurse Health Advisor   I discussed the limitations, risks, security and privacy concerns of performing an evaluation and management service by telephone and the availability of in person appointments. The patient expressed understanding and agreed to proceed.  Interactive audio and video telecommunications were attempted between this nurse and patient, however failed, due to patient having technical difficulties OR patient did not have access to video capability.  We continued and completed visit with audio only.  Some vital signs may be absent or patient reported.   Alyssa Peaches, LPN  Cardiac Risk Factors include: advanced age (>66mn, >>70women);diabetes mellitus     Objective:    Today's Vitals   02/07/22 1312  Weight: 212 lb (96.2 kg)  Height: '5\' 7"'$  (1.702 m)   Body mass index is 33.2 kg/m.     02/07/2022    1:19 PM 10/14/2020   10:44 AM  Advanced Directives  Does Patient Have a Medical Advance Directive? No No  Would patient like information on creating a medical advance directive? No - Patient declined No - Patient declined    Current Medications (verified) Outpatient Encounter Medications as of 02/07/2022  Medication Sig   atorvastatin (LIPITOR) 40 MG tablet TAKE 1 TABLET BY MOUTH EVERY DAY IN THE EVENING FOR CHOLESTEROL   metoprolol succinate (TOPROL-XL) 25 MG 24 hr tablet TAKE 1 TABLET BY MOUTH DAILY. FOR HEART RATE.   Ascorbic Acid (VITAMIN C) 1000 MG tablet Take 1,000 mg by mouth daily.   B-D ULTRAFINE III SHORT PEN 31G X 8 MM MISC  USE NIGHTLY WITH INSULIN PEN FOR DIABETES.   clopidogrel (PLAVIX) 75 MG tablet Take 1 tablet (75 mg total) by mouth daily.   Cyanocobalamin (VITAMIN B-12) 5000 MCG SUBL Place 5,000 mcg under the tongue daily.   glipiZIDE (GLUCOTROL) 5 MG tablet Take 1 tablet (5 mg total) by mouth 2 (two) times daily before a meal. for diabetes.   glucose blood (ONETOUCH VERIO) test strip USE TO TEST BLOOD SUGAR UP TO 4 TIMES DAILY   insulin glargine (LANTUS SOLOSTAR) 100 UNIT/ML Solostar Pen INJECT 20 UNITS INTO THE SKIN EVERY EVENING. FOR DIABETES   Lancet Devices MISC 1 each by Does not apply route as directed. One touch delicate lancet   Lancets (ONETOUCH DELICA PLUS [id number] MISC USE TO TEST BLOOD SUGAR UP TO 4 TIMES A DAY   Magnesium 250 MG TABS    metFORMIN (GLUCOPHAGE-XR) 500 MG 24 hr tablet Take 1 tablet (500 mg total) by mouth 2 (two) times daily. for diabetes.   nitroGLYCERIN (NITROSTAT) 0.4 MG SL tablet Place 1 tablet (0.4 mg total) under the tongue every 5 (five) minutes as needed for chest pain.   Semaglutide,0.25 or 0.'5MG'$ /DOS, (OZEMPIC, 0.25 OR 0.5 MG/DOSE,) 2 MG/1.5ML SOPN Inject 0.25 mg into the skin once weekly x4 weeks, then increase to 0.5 mg once weekly thereafter for diabetes.   tobramycin (TOBREX) 0.3 % ophthalmic solution PLEASE SEE ATTACHED FOR DETAILED DIRECTIONS   zinc gluconate 50 MG tablet Take 50 mg by mouth daily.   No facility-administered encounter medications on  file as of 02/07/2022.    Allergies (verified) Patient has no known allergies.   History: Past Medical History:  Diagnosis Date   Allergy    Anemia    CAD (coronary artery disease)    s/p DES to o-pLAD in 09/2020 // dLAD 75 (small, med Rx)   Chicken pox    Depression    Diabetes mellitus    Fainting episodes    Gastroesophageal reflux disease    Hyperlipidemia    controlled om meds    Sinus tachycardia    Past Surgical History:  Procedure Laterality Date   ANAL FISSURE REPAIR     CESAREAN SECTION      CORONARY STENT INTERVENTION N/A 10/14/2020   Procedure: CORONARY STENT INTERVENTION;  Surgeon: Jettie Booze, MD;  Location: [place] [facility];  Service: Cardiovascular;  Laterality: N/A;   INTRAVASCULAR IMAGING/OCT N/A 10/14/2020   Procedure: INTRAVASCULAR IMAGING/OCT;  Surgeon: Jettie Booze, MD;  Location: [place] [facility];  Service: Cardiovascular;  Laterality: N/A;   INTRAVASCULAR PRESSURE WIRE/FFR STUDY N/A 10/14/2020   Procedure: INTRAVASCULAR PRESSURE WIRE/FFR STUDY;  Surgeon: Jettie Booze, MD;  Location: [place] [facility];  Service: Cardiovascular;  Laterality: N/A;   LEFT HEART CATH AND CORONARY ANGIOGRAPHY N/A 10/14/2020   Procedure: LEFT HEART CATH AND CORONARY ANGIOGRAPHY;  Surgeon: Jettie Booze, MD;  Location: [place] [facility];  Service: Cardiovascular;  Laterality: N/A;   TONSILLECTOMY Bilateral    Family History  Problem Relation Age of Onset   Cancer Mother    Diabetes Mother    Miscarriages / [place] Mother    Hypertension Father    Heart attack Father    Cancer Sister    Diabetes Sister    Diabetes Brother    Heart attack Brother    Kidney disease Brother    Hyperlipidemia Brother    Colon cancer Neg Hx    Colon polyps Neg Hx    Esophageal cancer Neg Hx    Rectal cancer Neg Hx    Stomach cancer Neg Hx    Social History   Socioeconomic History   Marital status: Divorced    Spouse name: Not on file   Number of children: 3   Years of education: Not on file   Highest education level: 12th grade  Occupational History   Not on file  Tobacco Use   Smoking status: Former   Smokeless tobacco: Never  Substance and Sexual Activity   Alcohol use: Not Currently   Drug use: Never   Sexual activity: Not Currently  Other Topics Concern   Not on file  Social History Narrative   Not on file   Social Determinants of Health   Financial Resource Strain: Low Risk  (02/07/2022)   Overall Financial Resource Strain (CARDIA)     Difficulty of Paying Living Expenses: Not hard at all  Food Insecurity: No Food Insecurity (02/07/2022)   Hunger Vital Sign    Worried About Running Out of Food in the Last Year: Never true    Ran Out of Food in the Last Year: Never true  Transportation Needs: No Transportation Needs (02/07/2022)   PRAPARE - Hydrologist (Medical): No    Lack of Transportation (Non-Medical): No  Physical Activity: Insufficiently Active (02/07/2022)   Exercise Vital Sign    Days of Exercise per Week: 2 days    Minutes of Exercise per Session: 20 min  Stress: No Stress Concern Present (02/07/2022)  [place] [address] [place] Questionnaire    Feeling of Stress : Not at all  Social Connections: Socially Isolated (02/07/2022)   Social Connection and Isolation Panel [NHANES]    Frequency of Communication with Friends and Family: Once a week    Frequency of Social Gatherings with Friends and Family: Once a week    Attends Religious Services: Never    Marine scientist or Organizations: No    Attends Archivist Meetings: Never    Marital Status: Divorced    Tobacco Counseling Counseling given: Not Answered   Clinical Intake:  How often do you need to have someone help you when you read instructions, pamphlets, or other written materials from your doctor or pharmacy?: 1 - Never  Diabetic?  Yes Nutrition Risk Assessment:  Has the patient had any N/V/D within the last 2 months?  No  Does the patient have any non-healing wounds?  No  Has the patient had any unintentional weight loss or weight gain?  No   Diabetes:  Is the patient diabetic?  Yes  If diabetic, was a CBG obtained today?  Yes  CBG 98 Taken by patient Did the patient bring in their glucometer from home?  No  How often do you monitor your CBG's? Daily.   Financial Strains and Diabetes Management:  Are you having any financial strains with the device, your  supplies or your medication? No .  Does the patient want to be seen by Chronic Care Management for management of their diabetes?  No  Would the patient like to be referred to a Nutritionist or for Diabetic Management?  No   Diabetic Exams:  Diabetic Eye Exam: Completed Yes. Overdue for diabetic eye exam. Pt has been advised about the importance in completing this exam. A referral has been placed today. Message sent to referral coordinator for scheduling purposes. Advised pt to expect a call from office referred to regarding appt.  Diabetic Foot Exam: Completed Yes. Pt has been advised about the importance in completing this exam. Pt is scheduled for diabetic foot exam on Followed by PCP.   Interpreter Needed?: No Activities of Daily Living    02/07/2022    1:17 PM 02/06/2022    3:49 PM  In your present state of health, do you have any difficulty performing the following activities:  Hearing? 0 0  Vision? 0 0  Difficulty concentrating or making decisions? 0 0  Walking or climbing stairs? 0 0  Dressing or bathing? 0 0  Doing errands, shopping? 0 0  Preparing Food and eating ? N N  Using the Toilet? N N  In the past six months, have you accidently leaked urine? N N  Do you have problems with loss of bowel control? N N  Managing your Medications? N N  Managing your Finances? N N  Housekeeping or managing your Housekeeping? N N    Patient Care Team: Pleas Koch, NP as PCP - General (Internal Medicine) Nahser, Wonda Cheng, MD as PCP - Cardiology (Cardiology)  Indicate any recent Medical Services you may have received from other than [facility] providers in the past year (date may be approximate).     Assessment:   This is a routine wellness examination for Ethie.  Hearing/Vision screen Hearing Screening - Comments:: No hearing difficulty Vision Screening - Comments:: Wears reading glasses. Followed by Dr Baird Cancer  Dietary issues and exercise activities discussed: Exercise limited  by: None identified   Goals Addressed  This Visit's Progress     No current goals (pt-stated)         Depression Screen    02/07/2022    1:16 PM 02/09/2021    9:05 AM 05/12/2020   10:59 AM  PHQ 2/9 Scores  PHQ - 2 Score 0 0 0  PHQ- 9 Score  0 0    Fall Risk    02/07/2022    1:18 PM 02/06/2022    3:49 PM 02/09/2021    9:05 AM 05/12/2020   10:59 AM  Outlook in the past year? 0 0 0 0  Number falls in past yr: 0  0 0  Injury with Fall? 0  0 0  Risk for fall due to : No Fall Risks       FALL RISK PREVENTION PERTAINING TO THE HOME:  Any stairs in or around the home? Yes  If so, are there any without handrails? No  Home free of loose throw rugs in walkways, pet beds, electrical cords, etc? Yes  Adequate lighting in your home to reduce risk of falls? Yes   ASSISTIVE DEVICES UTILIZED TO PREVENT FALLS:  Life alert? No  Use of a cane, walker or w/c? No  Grab bars in the bathroom? No  Shower chair or bench in shower? No  Elevated toilet seat or a handicapped toilet? No   TIMED UP AND GO:  Was the test performed? No . Audio Visit  Cognitive Function:        02/07/2022    1:19 PM  6CIT Screen  What Year? 0 points  What month? 0 points  What time? 0 points  Count back from 20 0 points  Months in reverse 0 points  Repeat phrase 0 points  Total Score 0 points    Immunizations Immunization History  Administered Date(s) Administered   Influenza,inj,Quad PF,6+ Mos 05/21/2019   PFIZER(Purple Top)SARS-COV-2 Vaccination 12/12/2019, 01/02/2020   Pneumococcal Conjugate-13 10/07/2019   Pneumococcal Polysaccharide-23 02/09/2021   Zoster Recombinat (Shingrix) 09/22/2019, 01/27/2020       Pneumococcal vaccine status: Up to date  Covid-19 vaccine status: Completed vaccines  Qualifies for Shingles Vaccine? Yes   Zostavax completed Yes   Shingrix Completed?: Yes  Screening Tests Health Maintenance  Topic Date Due   OPHTHALMOLOGY EXAM   12/03/2020   URINE MICROALBUMIN  02/09/2022   TETANUS/TDAP  10/05/2022 (Originally 09/09/1974)   Hepatitis C Screening  02/08/2023 (Originally 09/09/1973)   FOOT EXAM  02/09/2022   INFLUENZA VACCINE  03/20/2022   HEMOGLOBIN A1C  04/04/2022   COLONOSCOPY (Pts 45-77yr Insurance coverage will need to be confirmed)  11/26/2022   MAMMOGRAM  12/29/2023   Pneumonia Vaccine 66 Years old  Completed   DEXA SCAN  Completed   Zoster Vaccines- Shingrix  Completed   HPV VACCINES  Aged Out   COVID-19 Vaccine  Discontinued    Health Maintenance  Health Maintenance Due  Topic Date Due   OPHTHALMOLOGY EXAM  12/03/2020   URINE MICROALBUMIN  02/09/2022    Colorectal cancer screening: Type of screening: Colonoscopy. Completed 11/26/19. Repeat every 3 years  Mammogram status: Completed 12/28/21. Repeat every year  Bone Density status: Completed 11/20/19. Results reflect: Bone density results: OSTEOPOROSIS. Repeat every 2 years.  Lung Cancer Screening: (Low Dose CT Chest recommended if Age 67-80years, 30 pack-year currently smoking OR have quit w/in 15years.) does not qualify.     Additional Screening:  Hepatitis C Screening: does qualify; Completed Patient deferred  Vision Screening:  Recommended annual ophthalmology exams for early detection of glaucoma and other disorders of the eye. Is the patient up to date with their annual eye exam?  Yes  Who is the provider or what is the name of the office in which the patient attends annual eye exams? Dr Baird Cancer If pt is not established with a provider, would they like to be referred to a provider to establish care? No .   Dental Screening: Recommended annual dental exams for proper oral hygiene  Community Resource Referral / Chronic Care Management:   CRR required this visit?  No   CCM required this visit?  No      Plan:     I have personally reviewed and noted the following in the patient's chart:   Medical and social history Use of  alcohol, tobacco or illicit drugs  Current medications and supplements including opioid prescriptions.  Functional ability and status Nutritional status Physical activity Advanced directives List of other physicians Hospitalizations, surgeries, and ER visits in previous 12 months Vitals Screenings to include cognitive, depression, and falls Referrals and appointments  In addition, I have reviewed and discussed with patient certain preventive protocols, quality metrics, and best practice recommendations. A written personalized care plan for preventive services as well as general preventive health recommendations were provided to patient.     Alyssa Peaches, LPN   [id number]/54/[id number]   Nurse Notes: None

## 2022-02-07 NOTE — Patient Instructions (Addendum)
Alyssa Stone , Thank you for taking time to come for your Medicare Wellness Visit. I appreciate your ongoing commitment to your health goals. Please review the following plan we discussed and let me know if I can assist you in the future.   These are the goals we discussed:  Goals       No current goals (pt-stated)        This is a list of the screening recommended for you and due dates:  Health Maintenance  Topic Date Due   Eye exam for diabetics  12/03/2020   Urine Protein Check  02/09/2022   Tetanus Vaccine  10/05/2022*   Hepatitis C Screening: USPSTF Recommendation to screen - Ages 18-79 yo.  02/08/2023*   Complete foot exam   02/09/2022   Flu Shot  03/20/2022   Hemoglobin A1C  04/04/2022   Colon Cancer Screening  11/26/2022   Mammogram  12/29/2023   Pneumonia Vaccine  Completed   DEXA scan (bone density measurement)  Completed   Zoster (Shingles) Vaccine  Completed   HPV Vaccine  Aged Out   COVID-19 Vaccine  Discontinued  *Topic was postponed. The date shown is not the original due date.    Advanced directives: Yes  Conditions/risks identified: None  Next appointment: Follow up in one year for your annual wellness visit    Preventive Care 65 Years and Older, Female Preventive care refers to lifestyle choices and visits with your health care provider that can promote health and wellness. What does preventive care include? A yearly physical exam. This is also called an annual well check. Dental exams once or twice a year. Routine eye exams. Ask your health care provider how often you should have your eyes checked. Personal lifestyle choices, including: Daily care of your teeth and gums. Regular physical activity. Eating a healthy diet. Avoiding tobacco and drug use. Limiting alcohol use. Practicing safe sex. Taking low-dose aspirin every day. Taking vitamin and mineral supplements as recommended by your health care provider. What happens during an annual well  check? The services and screenings done by your health care provider during your annual well check will depend on your age, overall health, lifestyle risk factors, and family history of disease. Counseling  Your health care provider may ask you questions about your: Alcohol use. Tobacco use. Drug use. Emotional well-being. Home and relationship well-being. Sexual activity. Eating habits. History of falls. Memory and ability to understand (cognition). Work and work Statistician. Reproductive health. Screening  You may have the following tests or measurements: Height, weight, and BMI. Blood pressure. Lipid and cholesterol levels. These may be checked every 5 years, or more frequently if you are over 33 years old. Skin check. Lung cancer screening. You may have this screening every year starting at age 80 if you have a 30-pack-year history of smoking and currently smoke or have quit within the past 15 years. Fecal occult blood test (FOBT) of the stool. You may have this test every year starting at age 52. Flexible sigmoidoscopy or colonoscopy. You may have a sigmoidoscopy every 5 years or a colonoscopy every 10 years starting at age 53. Hepatitis C blood test. Hepatitis B blood test. Sexually transmitted disease (STD) testing. Diabetes screening. This is done by checking your blood sugar (glucose) after you have not eaten for a while (fasting). You may have this done every 1-3 years. Bone density scan. This is done to screen for osteoporosis. You may have this done starting at age 69. Mammogram. This  may be done every 1-2 years. Talk to your health care provider about how often you should have regular mammograms. Talk with your health care provider about your test results, treatment options, and if necessary, the need for more tests. Vaccines  Your health care provider may recommend certain vaccines, such as: Influenza vaccine. This is recommended every year. Tetanus, diphtheria, and  acellular pertussis (Tdap, Td) vaccine. You may need a Td booster every 10 years. Zoster vaccine. You may need this after age 42. Pneumococcal 13-valent conjugate (PCV13) vaccine. One dose is recommended after age 60. Pneumococcal polysaccharide (PPSV23) vaccine. One dose is recommended after age 79. Talk to your health care provider about which screenings and vaccines you need and how often you need them. This information is not intended to replace advice given to you by your health care provider. Make sure you discuss any questions you have with your health care provider. Document Released: 09/02/2015 Document Revised: 04/25/2016 Document Reviewed: 06/07/2015 Elsevier Interactive Patient Education  2017 [place] Prevention in the Home Falls can cause injuries. They can happen to people of all ages. There are many things you can do to make your home safe and to help prevent falls. What can I do on the outside of my home? Regularly fix the edges of walkways and driveways and fix any cracks. Remove anything that might make you trip as you walk through a door, such as a raised step or threshold. Trim any bushes or trees on the path to your home. Use bright outdoor lighting. Clear any walking paths of anything that might make someone trip, such as rocks or tools. Regularly check to see if handrails are loose or broken. Make sure that both sides of any steps have handrails. Any raised decks and porches should have guardrails on the edges. Have any leaves, snow, or ice cleared regularly. Use sand or salt on walking paths during winter. Clean up any spills in your garage right away. This includes oil or grease spills. What can I do in the bathroom? Use night lights. Install grab bars by the toilet and in the tub and shower. Do not use towel bars as grab bars. Use non-skid mats or decals in the tub or shower. If you need to sit down in the shower, use a plastic, non-slip stool. Keep  the floor dry. Clean up any water that spills on the floor as soon as it happens. Remove soap buildup in the tub or shower regularly. Attach bath mats securely with double-sided non-slip rug tape. Do not have throw rugs and other things on the floor that can make you trip. What can I do in the bedroom? Use night lights. Make sure that you have a light by your bed that is easy to reach. Do not use any sheets or blankets that are too big for your bed. They should not hang down onto the floor. Have a firm chair that has side arms. You can use this for support while you get dressed. Do not have throw rugs and other things on the floor that can make you trip. What can I do in the kitchen? Clean up any spills right away. Avoid walking on wet floors. Keep items that you use a lot in easy-to-reach places. If you need to reach something above you, use a strong step stool that has a grab bar. Keep electrical cords out of the way. Do not use floor polish or wax that makes floors slippery. If you must  use wax, use non-skid floor wax. Do not have throw rugs and other things on the floor that can make you trip. What can I do with my stairs? Do not leave any items on the stairs. Make sure that there are handrails on both sides of the stairs and use them. Fix handrails that are broken or loose. Make sure that handrails are as long as the stairways. Check any carpeting to make sure that it is firmly attached to the stairs. Fix any carpet that is loose or worn. Avoid having throw rugs at the top or bottom of the stairs. If you do have throw rugs, attach them to the floor with carpet tape. Make sure that you have a light switch at the top of the stairs and the bottom of the stairs. If you do not have them, ask someone to add them for you. What else can I do to help prevent falls? Wear shoes that: Do not have high heels. Have rubber bottoms. Are comfortable and fit you well. Are closed at the toe. Do not  wear sandals. If you use a stepladder: Make sure that it is fully opened. Do not climb a closed stepladder. Make sure that both sides of the stepladder are locked into place. Ask someone to hold it for you, if possible. Clearly mark and make sure that you can see: Any grab bars or handrails. First and last steps. Where the edge of each step is. Use tools that help you move around (mobility aids) if they are needed. These include: Canes. Walkers. Scooters. Crutches. Turn on the lights when you go into a dark area. Replace any light bulbs as soon as they burn out. Set up your furniture so you have a clear path. Avoid moving your furniture around. If any of your floors are uneven, fix them. If there are any pets around you, be aware of where they are. Review your medicines with your doctor. Some medicines can make you feel dizzy. This can increase your chance of falling. Ask your doctor what other things that you can do to help prevent falls. This information is not intended to replace advice given to you by your health care provider. Make sure you discuss any questions you have with your health care provider. Document Released: 06/02/2009 Document Revised: 01/12/2016 Document Reviewed: 09/10/2014 Elsevier Interactive Patient Education  2017 [employer].

## 2022-02-22 ENCOUNTER — Ambulatory Visit (INDEPENDENT_AMBULATORY_CARE_PROVIDER_SITE_OTHER): Payer: PPO | Admitting: Primary Care

## 2022-02-22 ENCOUNTER — Encounter: Payer: Self-pay | Admitting: Primary Care

## 2022-02-22 VITALS — BP 110/62 | HR 68 | Temp 98.6°F | Ht 67.0 in | Wt 210.0 lb

## 2022-02-22 DIAGNOSIS — E113513 Type 2 diabetes mellitus with proliferative diabetic retinopathy with macular edema, bilateral: Secondary | ICD-10-CM | POA: Diagnosis not present

## 2022-02-22 DIAGNOSIS — I2510 Atherosclerotic heart disease of native coronary artery without angina pectoris: Secondary | ICD-10-CM

## 2022-02-22 DIAGNOSIS — Z0000 Encounter for general adult medical examination without abnormal findings: Secondary | ICD-10-CM

## 2022-02-22 DIAGNOSIS — E782 Mixed hyperlipidemia: Secondary | ICD-10-CM

## 2022-02-22 DIAGNOSIS — Z794 Long term (current) use of insulin: Secondary | ICD-10-CM

## 2022-02-22 DIAGNOSIS — E2839 Other primary ovarian failure: Secondary | ICD-10-CM

## 2022-02-22 DIAGNOSIS — Z87898 Personal history of other specified conditions: Secondary | ICD-10-CM

## 2022-02-22 LAB — CBC
HCT: 39.2 % (ref 36.0–46.0)
Hemoglobin: 13.3 g/dL (ref 12.0–15.0)
MCHC: 33.8 g/dL (ref 30.0–36.0)
MCV: 91.8 fl (ref 78.0–100.0)
Platelets: 296 10*3/uL (ref 150.0–400.0)
RBC: 4.27 Mil/uL (ref 3.87–5.11)
RDW: 13.4 % (ref 11.5–15.5)
WBC: 10.2 10*3/uL (ref 4.0–10.5)

## 2022-02-22 LAB — LIPID PANEL
Cholesterol: 103 mg/dL (ref 0–200)
HDL: 36.5 mg/dL — ABNORMAL LOW (ref >39.00)
LDL Cholesterol: 41 mg/dL (ref 0–99)
NonHDL: 66.6
Total CHOL/HDL Ratio: 3
Triglycerides: 130 mg/dL (ref 0.0–149.0)
VLDL: 26 mg/dL (ref 0.0–40.0)

## 2022-02-22 LAB — MICROALBUMIN / CREATININE URINE RATIO
Creatinine,U: 134.2 mg/dL
Microalb Creat Ratio: 1.8 mg/g (ref 0.0–30.0)
Microalb, Ur: 2.4 mg/dL — ABNORMAL HIGH (ref 0.0–1.9)

## 2022-02-22 LAB — COMPREHENSIVE METABOLIC PANEL
ALT: 10 U/L (ref 0–35)
AST: 11 U/L (ref 0–37)
Albumin: 4.1 g/dL (ref 3.5–5.2)
Alkaline Phosphatase: 55 U/L (ref 39–117)
BUN: 13 mg/dL (ref 6–23)
CO2: 30 mEq/L (ref 19–32)
Calcium: 9.9 mg/dL (ref 8.4–10.5)
Chloride: 100 mEq/L (ref 96–112)
Creatinine, Ser: 0.56 mg/dL (ref 0.40–1.20)
GFR: 94.41 mL/min (ref >60.00)
Glucose, Bld: 113 mg/dL — ABNORMAL HIGH (ref 70–99)
Potassium: 4.4 mEq/L (ref 3.5–5.1)
Sodium: 138 mEq/L (ref 135–145)
Total Bilirubin: 0.3 mg/dL (ref 0.2–1.2)
Total Protein: 7.2 g/dL (ref 6.0–8.3)

## 2022-02-22 LAB — HEMOGLOBIN A1C: Hgb A1c MFr Bld: 7.6 % — ABNORMAL HIGH (ref 4.6–6.5)

## 2022-02-22 MED ORDER — SCOPOLAMINE 1 MG/3DAYS TD PT72
1.0000 | MEDICATED_PATCH | TRANSDERMAL | 0 refills | Status: DC
  Filled 2022-05-30: qty 3, 9d supply, fill #0

## 2022-02-22 NOTE — Assessment & Plan Note (Signed)
LDL goal of less than 70.  Continue atorvastatin 40 mg daily.  Repeat lipid panel pending.

## 2022-02-22 NOTE — Progress Notes (Signed)
Subjective:    Patient ID: Alyssa Stone, female    DOB: Dec 29, 1955, 66 y.o.   MRN: [id number]  HPI  Alyssa Stone is a very pleasant 66 y.o. female who presents today for complete physical and follow up of chronic conditions.  She is also requesting a prescription for scopolamine patches to use for an upcoming crease.  History of motion sickness previously, does well with scopolamine patches.  Immunizations: -Influenza: Did not complete last season  -Covid-19: 2 vaccines -Shingles: Completed Shingrix series -Pneumonia: Prevnar 13 in 2021, Pneumovax 23 in 2022.  Diet: Fair diet.  Exercise: No regular exercise.  Eye exam: Completes annually  Dental exam: Completes semi-annually   Mammogram: Completed in May 2023  Colonoscopy: Completed in 2021, due 2024 Dexa: Completed in April 2021   BP Readings from Last 3 Encounters:  02/22/22 110/62  11/17/21 (!) 142/86  10/05/21 (!) 102/58       Review of Systems  Constitutional:  Negative for unexpected weight change.  HENT:  Negative for rhinorrhea.   Respiratory:  Negative for cough and shortness of breath.   Cardiovascular:  Negative for chest pain.  Gastrointestinal:  Negative for constipation and diarrhea.  Genitourinary:  Negative for difficulty urinating.  Musculoskeletal:  Negative for arthralgias and myalgias.  Skin:  Negative for rash.  Allergic/Immunologic: Negative for environmental allergies.  Neurological:  Negative for dizziness and headaches.  Psychiatric/Behavioral:  The patient is not nervous/anxious.          Past Medical History:  Diagnosis Date   Allergy    Anemia    CAD (coronary artery disease)    s/p DES to o-pLAD in 09/2020 // dLAD 75 (small, med Rx)   Chicken pox    Depression    Diabetes mellitus    Fainting episodes    Gastroesophageal reflux disease    Hyperlipidemia    controlled om meds    Sinus tachycardia     Social History   Socioeconomic History   Marital status:  Divorced    Spouse name: Not on file   Number of children: 3   Years of education: Not on file   Highest education level: 12th grade  Occupational History   Not on file  Tobacco Use   Smoking status: Former   Smokeless tobacco: Never  Substance and Sexual Activity   Alcohol use: Not Currently   Drug use: Never   Sexual activity: Not Currently  Other Topics Concern   Not on file  Social History Narrative   Not on file   Social Determinants of Health   Financial Resource Strain: Low Risk  (02/07/2022)   Overall Financial Resource Strain (CARDIA)    Difficulty of Paying Living Expenses: Not hard at all  Food Insecurity: No Food Insecurity (02/07/2022)   Hunger Vital Sign    Worried About Running Out of Food in the Last Year: Never true    Ran Out of Food in the Last Year: Never true  Transportation Needs: No Transportation Needs (02/07/2022)   PRAPARE - Hydrologist (Medical): No    Lack of Transportation (Non-Medical): No  Physical Activity: Insufficiently Active (02/07/2022)   Exercise Vital Sign    Days of Exercise per Week: 2 days    Minutes of Exercise per Session: 20 min  Stress: No Stress Concern Present (02/07/2022)   [place]    Feeling of Stress : Not at all  Social Connections: Socially Isolated (02/07/2022)   Social Connection and Isolation Panel [NHANES]    Frequency of Communication with Friends and Family: Once a week    Frequency of Social Gatherings with Friends and Family: Once a week    Attends Religious Services: Never    Marine scientist or Organizations: No    Attends Archivist Meetings: Never    Marital Status: Divorced  Human resources officer Violence: Not At Risk (02/07/2022)   Humiliation, Afraid, Rape, and Kick questionnaire    Fear of Current or Ex-Partner: No    Emotionally Abused: No    Physically Abused: No    Sexually Abused: No     Past Surgical History:  Procedure Laterality Date   ANAL FISSURE REPAIR     CESAREAN SECTION     CORONARY STENT INTERVENTION N/A 10/14/2020   Procedure: CORONARY STENT INTERVENTION;  Surgeon: Jettie Booze, MD;  Location: [facility];  Service: Cardiovascular;  Laterality: N/A;   INTRAVASCULAR IMAGING/OCT N/A 10/14/2020   Procedure: INTRAVASCULAR IMAGING/OCT;  Surgeon: Jettie Booze, MD;  Location: [facility];  Service: Cardiovascular;  Laterality: N/A;   INTRAVASCULAR PRESSURE WIRE/FFR STUDY N/A 10/14/2020   Procedure: INTRAVASCULAR PRESSURE WIRE/FFR STUDY;  Surgeon: Jettie Booze, MD;  Location: [facility];  Service: Cardiovascular;  Laterality: N/A;   LEFT HEART CATH AND CORONARY ANGIOGRAPHY N/A 10/14/2020   Procedure: LEFT HEART CATH AND CORONARY ANGIOGRAPHY;  Surgeon: Jettie Booze, MD;  Location: [facility];  Service: Cardiovascular;  Laterality: N/A;   TONSILLECTOMY Bilateral     Family History  Problem Relation Age of Onset   Cancer Mother    Diabetes Mother    Miscarriages / [place] Mother    Hypertension Father    Heart attack Father    Cancer Sister    Diabetes Sister    Diabetes Brother    Heart attack Brother    Kidney disease Brother    Hyperlipidemia Brother    Colon cancer Neg Hx    Colon polyps Neg Hx    Esophageal cancer Neg Hx    Rectal cancer Neg Hx    Stomach cancer Neg Hx     No Known Allergies  Current Outpatient Medications on File Prior to Visit  Medication Sig Dispense Refill   Ascorbic Acid (VITAMIN C) 1000 MG tablet Take 1,000 mg by mouth daily.     atorvastatin (LIPITOR) 40 MG tablet TAKE 1 TABLET BY MOUTH EVERY DAY IN THE EVENING FOR CHOLESTEROL 90 tablet 0   B-D ULTRAFINE III SHORT PEN 31G X 8 MM MISC USE NIGHTLY WITH INSULIN PEN FOR DIABETES. 100 each 3   clopidogrel (PLAVIX) 75 MG tablet Take 1 tablet (75 mg total) by mouth daily. 90 tablet 3   Cyanocobalamin (VITAMIN B-12)  5000 MCG SUBL Place 5,000 mcg under the tongue daily.     glipiZIDE (GLUCOTROL) 5 MG tablet Take 1 tablet (5 mg total) by mouth 2 (two) times daily before a meal. for diabetes. 180 tablet 1   glucose blood (ONETOUCH VERIO) test strip USE TO TEST BLOOD SUGAR UP TO 4 TIMES DAILY 300 strip 3   insulin glargine (LANTUS SOLOSTAR) 100 UNIT/ML Solostar Pen INJECT 20 UNITS INTO THE SKIN EVERY EVENING. FOR DIABETES 15 mL 1   Lancets (ONETOUCH DELICA PLUS [id number]) MISC USE TO TEST BLOOD SUGAR UP TO 4 TIMES A DAY 100 each 5   Magnesium 250 MG TABS  metFORMIN (GLUCOPHAGE-XR) 500 MG 24 hr tablet Take 1 tablet (500 mg total) by mouth 2 (two) times daily. for diabetes. 180 tablet 1   metoprolol succinate (TOPROL-XL) 25 MG 24 hr tablet TAKE 1 TABLET BY MOUTH DAILY. FOR HEART RATE. 90 tablet 0   nitroGLYCERIN (NITROSTAT) 0.4 MG SL tablet Place 1 tablet (0.4 mg total) under the tongue every 5 (five) minutes as needed for chest pain. 25 tablet 3   Semaglutide,0.25 or 0.'5MG'$ /DOS, (OZEMPIC, 0.25 OR 0.5 MG/DOSE,) 2 MG/1.5ML SOPN Inject 0.25 mg into the skin once weekly x4 weeks, then increase to 0.5 mg once weekly thereafter for diabetes. 4.5 mL 0   tobramycin (TOBREX) 0.3 % ophthalmic solution PLEASE SEE ATTACHED FOR DETAILED DIRECTIONS     zinc gluconate 50 MG tablet Take 50 mg by mouth daily.     No current facility-administered medications on file prior to visit.    BP 110/62   Pulse 68   Temp 98.6 F (37 C) (Oral)   Ht '5\' 7"'$  (1.702 m)   Wt 210 lb (95.3 kg)   SpO2 97%   BMI 32.89 kg/m  Objective:   Physical Exam HENT:     Right Ear: Tympanic membrane and ear canal normal.     Left Ear: Tympanic membrane and ear canal normal.     Nose: Nose normal.  Eyes:     Conjunctiva/sclera: Conjunctivae normal.     Pupils: Pupils are equal, round, and reactive to light.  Neck:     Thyroid: No thyromegaly.  Cardiovascular:     Rate and Rhythm: Normal rate and regular rhythm.     Heart sounds: No murmur  heard. Pulmonary:     Effort: Pulmonary effort is normal.     Breath sounds: Normal breath sounds. No rales.  Abdominal:     General: Bowel sounds are normal.     Palpations: Abdomen is soft.     Tenderness: There is no abdominal tenderness.  Musculoskeletal:        General: Normal range of motion.     Cervical back: Neck supple.  Lymphadenopathy:     Cervical: No cervical adenopathy.  Skin:    General: Skin is warm and dry.     Findings: No rash.  Neurological:     Mental Status: She is alert and oriented to person, place, and time.     Cranial Nerves: No cranial nerve deficit.     Deep Tendon Reflexes: Reflexes are normal and symmetric.  Psychiatric:        Mood and Affect: Mood normal.           Assessment & Plan:   Problem List Items Addressed This Visit       Cardiovascular and Mediastinum   CAD in native artery    Asymptomatic.   Following with cardiology.  Continue Plavix 75 mg daily.  Continue Metoprolol succinate 25 mg daily.   Continue lipid and BP control. LDL goal <70.         Endocrine   Type 2 diabetes mellitus (HCC)    Glucose readings improved, 80s to 130s.  Repeat A1c pending.  Cannot tolerate dose increase of semaglutide to 0.5 mg, continue semaglutide 0.25 mg weekly. Continue metformin 500 mg twice daily, Lantus 20 units nightly, glipizide 5 mg twice daily.  Goal is to come off of Lantus.  Urine microalbumin due and pending. Pneumonia vaccine up-to-date. She will schedule eye exam.  Follow-up in 6 months.      Relevant  Orders   Hemoglobin A1c   CBC   Microalbumin / creatinine urine ratio     Other   Healthcare maintenance - Primary    Immunizations UTD. Mammogram up-to-date. Bone density scan due, orders placed. Colonoscopy up-to-date, due 2024.  Discussed the importance of a healthy diet and regular exercise in order for weight loss, and to reduce the risk of further co-morbidity.  Exam stable. Labs pending.  Follow  up in 1 year for repeat physical.       Hyperlipidemia    LDL goal of less than 70.  Continue atorvastatin 40 mg daily.  Repeat lipid panel pending.      Relevant Orders   Lipid panel   Comprehensive metabolic panel   CBC   History of motion sickness    Prescription for scopolamine patches provided to use as needed.      Relevant Medications   scopolamine (TRANSDERM-SCOP) 1 MG/3DAYS   Other Visit Diagnoses     Estrogen deficiency       Relevant Orders   DG Bone Density          Pleas Koch, NP

## 2022-02-22 NOTE — Assessment & Plan Note (Signed)
Prescription for scopolamine patches provided to use as needed.

## 2022-02-22 NOTE — Assessment & Plan Note (Signed)
Asymptomatic.   Following with cardiology.  Continue Plavix 75 mg daily.  Continue Metoprolol succinate 25 mg daily.   Continue lipid and BP control. LDL goal <70.

## 2022-02-22 NOTE — Assessment & Plan Note (Signed)
Glucose readings improved, 80s to 130s.  Repeat A1c pending.  Cannot tolerate dose increase of semaglutide to 0.5 mg, continue semaglutide 0.25 mg weekly. Continue metformin 500 mg twice daily, Lantus 20 units nightly, glipizide 5 mg twice daily.  Goal is to come off of Lantus.  Urine microalbumin due and pending. Pneumonia vaccine up-to-date. She will schedule eye exam.  Follow-up in 6 months.

## 2022-02-22 NOTE — Assessment & Plan Note (Signed)
Immunizations UTD. Mammogram up-to-date. Bone density scan due, orders placed. Colonoscopy up-to-date, due 2024.  Discussed the importance of a healthy diet and regular exercise in order for weight loss, and to reduce the risk of further co-morbidity.  Exam stable. Labs pending.  Follow up in 1 year for repeat physical.

## 2022-02-22 NOTE — Patient Instructions (Signed)
Uses scopolamine patches for motion sickness every 3 days as needed.  Stop by the lab prior to leaving today. I will notify you of your results once received.   [facility] density scan.  Please schedule a follow up visit for 6 months for a diabetes check.  It was a pleasure to see you today!  Preventive Care 52 Years and Older, Female Preventive care refers to lifestyle choices and visits with your health care provider that can promote health and wellness. Preventive care visits are also called wellness exams. What can I expect for my preventive care visit? Counseling Your health care provider may ask you questions about your: Medical history, including: Past medical problems. Family medical history. Pregnancy and menstrual history. History of falls. Current health, including: Memory and ability to understand (cognition). Emotional well-being. Home life and relationship well-being. Sexual activity and sexual health. Lifestyle, including: Alcohol, nicotine or tobacco, and drug use. Access to firearms. Diet, exercise, and sleep habits. Work and work Statistician. Sunscreen use. Safety issues such as seatbelt and bike helmet use. Physical exam Your health care provider will check your: Height and weight. These may be used to calculate your BMI (body mass index). BMI is a measurement that tells if you are at a healthy weight. Waist circumference. This measures the distance around your waistline. This measurement also tells if you are at a healthy weight and may help predict your risk of certain diseases, such as type 2 diabetes and high blood pressure. Heart rate and blood pressure. Body temperature. Skin for abnormal spots. What immunizations do I need?  Vaccines are usually given at various ages, according to a schedule. Your health care provider will recommend vaccines for you based on your age, medical history, and lifestyle or other factors,  such as travel or where you work. What tests do I need? Screening Your health care provider may recommend screening tests for certain conditions. This may include: Lipid and cholesterol levels. Hepatitis C test. Hepatitis B test. HIV (human immunodeficiency virus) test. STI (sexually transmitted infection) testing, if you are at risk. Lung cancer screening. Colorectal cancer screening. Diabetes screening. This is done by checking your blood sugar (glucose) after you have not eaten for a while (fasting). Mammogram. Talk with your health care provider about how often you should have regular mammograms. BRCA-related cancer screening. This may be done if you have a family history of breast, ovarian, tubal, or peritoneal cancers. Bone density scan. This is done to screen for osteoporosis. Talk with your health care provider about your test results, treatment options, and if necessary, the need for more tests. Follow these instructions at home: Eating and drinking  Eat a diet that includes fresh fruits and vegetables, whole grains, lean protein, and low-fat dairy products. Limit your intake of foods with high amounts of sugar, saturated fats, and salt. Take vitamin and mineral supplements as recommended by your health care provider. Do not drink alcohol if your health care provider tells you not to drink. If you drink alcohol: Limit how much you have to 0-1 drink a day. Know how much alcohol is in your drink. In the [place] one drink equals one 12 oz bottle of beer (355 mL), one 5 oz glass of wine (148 mL), or one 1 oz glass of hard liquor (44 mL). Lifestyle Brush your teeth every morning and night with fluoride toothpaste. Floss one time each day. Exercise for at least 30 minutes 5 or more days  each week. Do not use any products that contain nicotine or tobacco. These products include cigarettes, chewing tobacco, and vaping devices, such as e-cigarettes. If you need help quitting, ask your  health care provider. Do not use drugs. If you are sexually active, practice safe sex. Use a condom or other form of protection in order to prevent STIs. Take aspirin only as told by your health care provider. Make sure that you understand how much to take and what form to take. Work with your health care provider to find out whether it is safe and beneficial for you to take aspirin daily. Ask your health care provider if you need to take a cholesterol-lowering medicine (statin). Find healthy ways to manage stress, such as: Meditation, yoga, or listening to music. Journaling. Talking to a trusted person. Spending time with friends and family. Minimize exposure to UV radiation to reduce your risk of skin cancer. Safety Always wear your seat belt while driving or riding in a vehicle. Do not drive: If you have been drinking alcohol. Do not ride with someone who has been drinking. When you are tired or distracted. While texting. If you have been using any mind-altering substances or drugs. Wear a helmet and other protective equipment during sports activities. If you have firearms in your house, make sure you follow all gun safety procedures. What's next? Visit your health care provider once a year for an annual wellness visit. Ask your health care provider how often you should have your eyes and teeth checked. Stay up to date on all vaccines. This information is not intended to replace advice given to you by your health care provider. Make sure you discuss any questions you have with your health care provider. Document Revised: 02/01/2021 Document Reviewed: 02/01/2021 Elsevier Patient Education  [employer].

## 2022-02-24 ENCOUNTER — Other Ambulatory Visit: Payer: Self-pay | Admitting: Primary Care

## 2022-02-24 DIAGNOSIS — E1140 Type 2 diabetes mellitus with diabetic neuropathy, unspecified: Secondary | ICD-10-CM

## 2022-03-02 ENCOUNTER — Other Ambulatory Visit: Payer: Self-pay | Admitting: Primary Care

## 2022-03-02 DIAGNOSIS — E1140 Type 2 diabetes mellitus with diabetic neuropathy, unspecified: Secondary | ICD-10-CM

## 2022-03-30 LAB — HM DIABETES EYE EXAM

## 2022-04-01 IMAGING — MG DIGITAL SCREENING BILAT W/ TOMO W/ CAD
8 series · 8 of 24 positions shown · non-contrast
Comparison: None.

CLINICAL DATA: Screening.

EXAM:
DIGITAL SCREENING BILATERAL MAMMOGRAM WITH TOMO AND CAD

[L CC synth-2D]
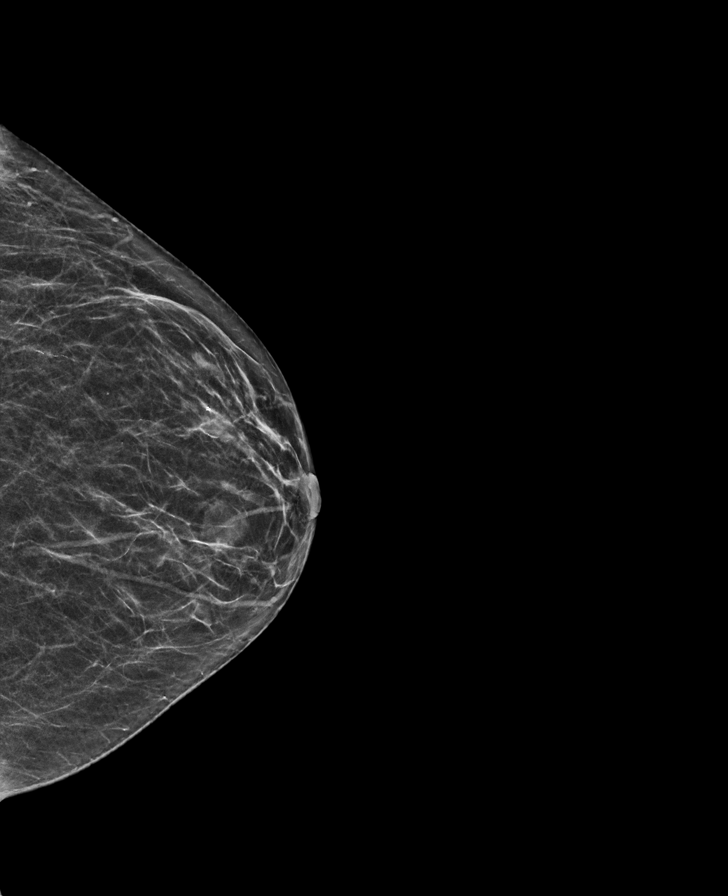

[R MLO synth-2D]
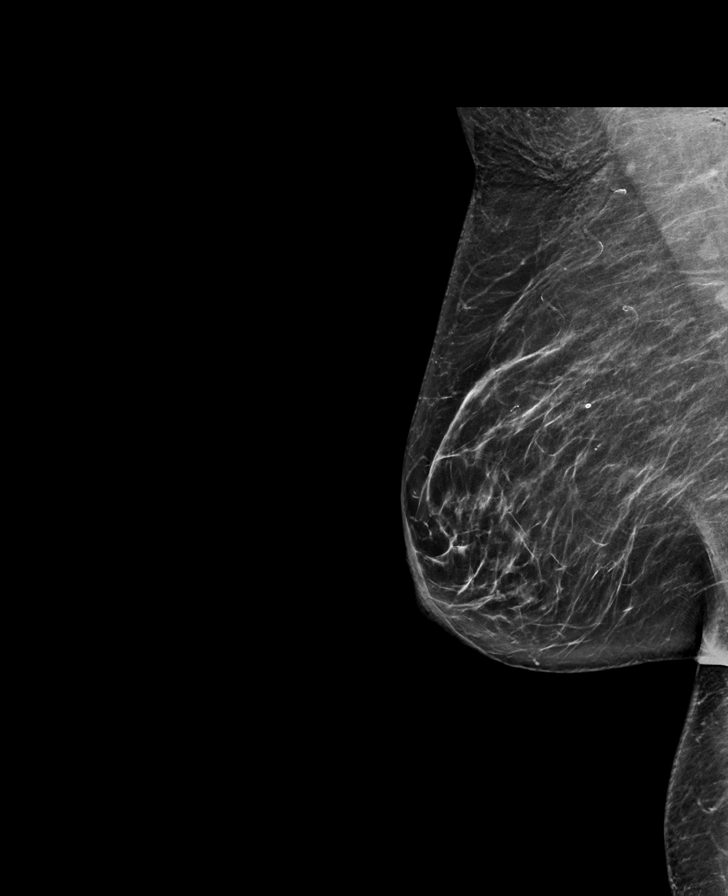

[R CC synth-2D]
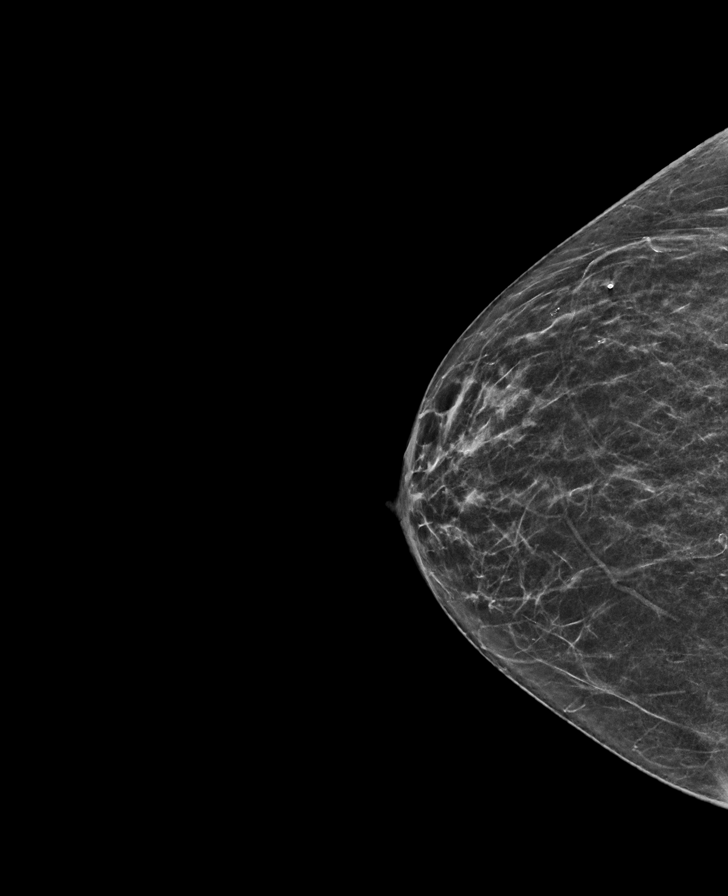

[L MLO synth-2D]
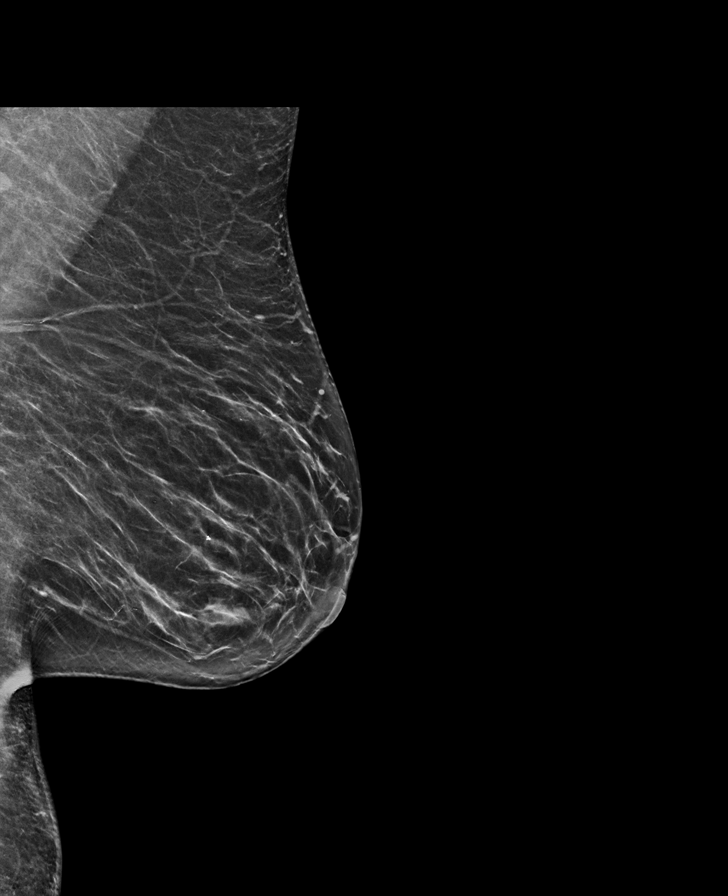

[R MLO tomo · tomo slice 37/72.0]
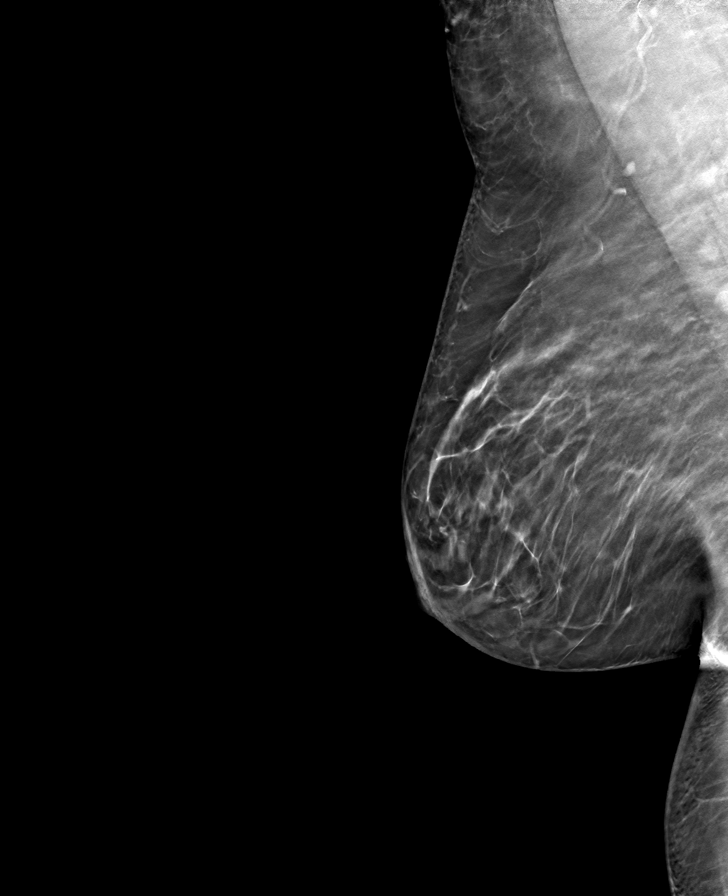

[R CC tomo · tomo slice 27/54.0]
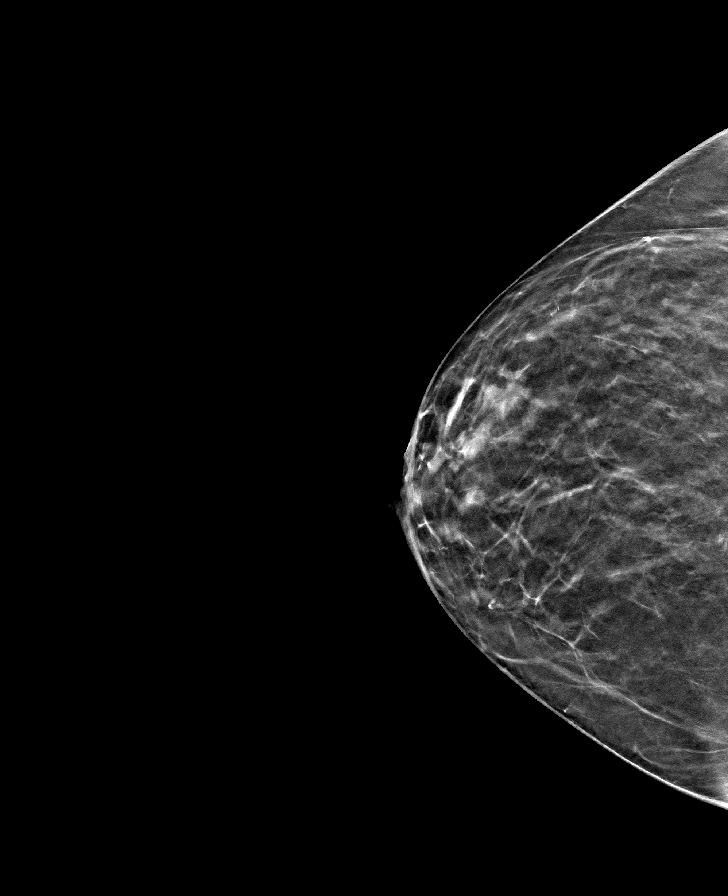

[L MLO tomo · tomo slice 35/68.0]
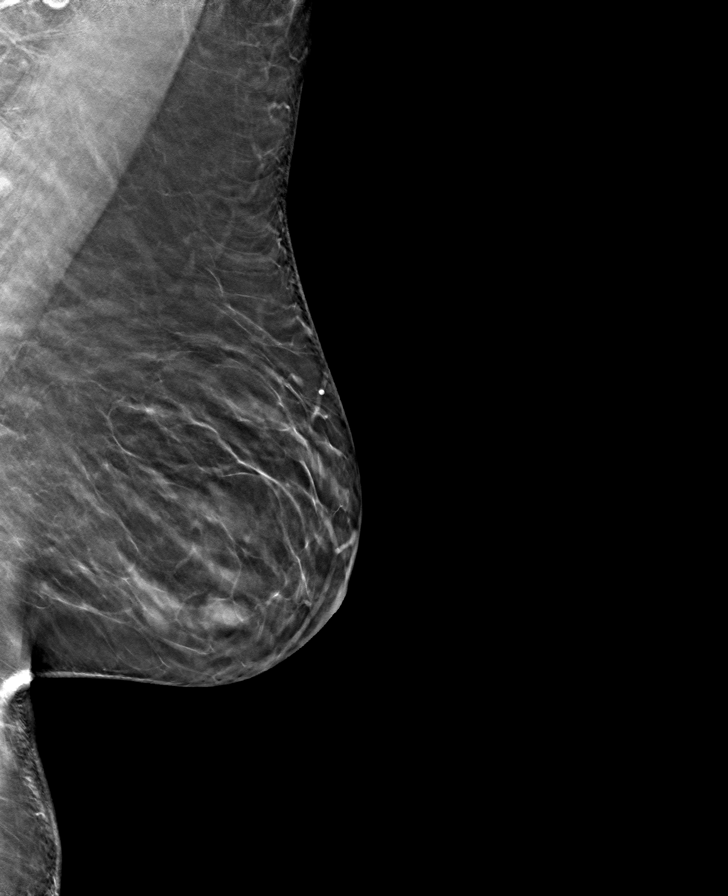

[L CC tomo · tomo slice 27/54.0]
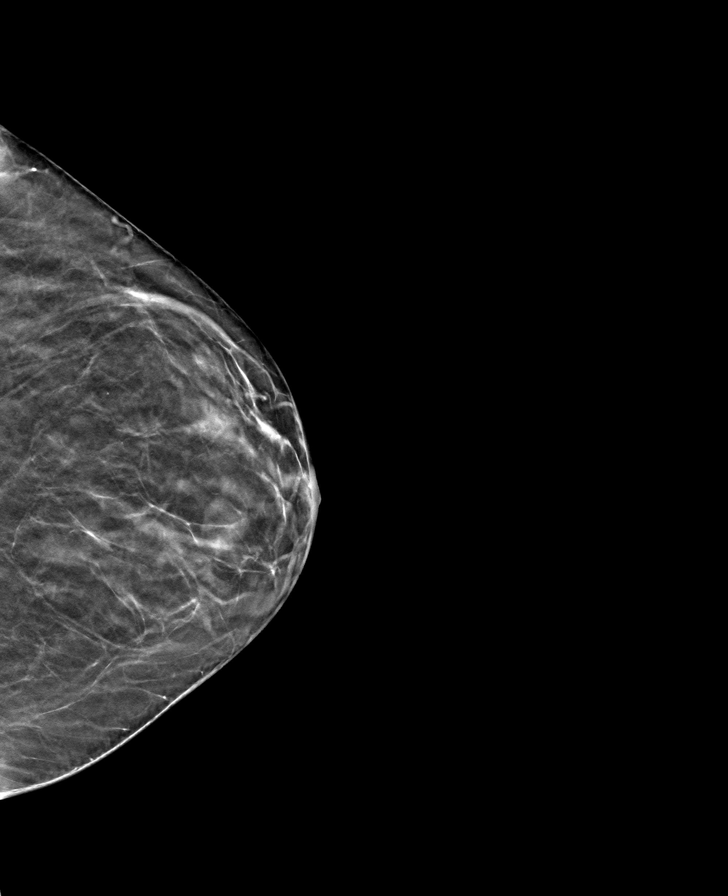

[8 of 24 positions shown; findings below may reference images not displayed]

ACR Breast Density Category b: There are scattered areas of
fibroglandular density.
FINDINGS: In the left breast, a possible asymmetry warrants further
evaluation. In the right breast, no findings suspicious for
malignancy. Images were processed with CAD.
IMPRESSION: Further evaluation is suggested for possible asymmetry in the left
breast.

RECOMMENDATION:
Diagnostic mammogram and possibly ultrasound of the left breast.
(Code:[id number]-[id number])

The patient will be contacted regarding the findings, and additional
imaging will be scheduled.

BI-RADS CATEGORY  0: Incomplete. Need additional imaging evaluation
and/or prior mammograms for comparison.

## 2022-04-03 ENCOUNTER — Encounter: Payer: Self-pay | Admitting: Primary Care

## 2022-04-11 ENCOUNTER — Other Ambulatory Visit: Payer: Self-pay

## 2022-04-16 ENCOUNTER — Other Ambulatory Visit: Payer: Self-pay | Admitting: Family Medicine

## 2022-04-16 DIAGNOSIS — Z794 Long term (current) use of insulin: Secondary | ICD-10-CM

## 2022-04-19 ENCOUNTER — Other Ambulatory Visit: Payer: Self-pay

## 2022-04-25 ENCOUNTER — Other Ambulatory Visit: Payer: Self-pay

## 2022-04-26 ENCOUNTER — Other Ambulatory Visit: Payer: Self-pay

## 2022-04-26 ENCOUNTER — Other Ambulatory Visit: Payer: Self-pay | Admitting: Primary Care

## 2022-04-26 DIAGNOSIS — E1140 Type 2 diabetes mellitus with diabetic neuropathy, unspecified: Secondary | ICD-10-CM

## 2022-04-26 DIAGNOSIS — Z794 Long term (current) use of insulin: Secondary | ICD-10-CM

## 2022-04-26 MED ORDER — SEMAGLUTIDE(0.25 OR 0.5MG/DOS) 2 MG/3ML ~~LOC~~ SOPN
0.5000 mg | PEN_INJECTOR | SUBCUTANEOUS | 0 refills | Status: DC
  Filled 2022-04-26: qty 9, 84d supply, fill #0
  Filled 2022-04-26: qty 3, 28d supply, fill #0

## 2022-04-26 MED ORDER — METFORMIN HCL ER 500 MG PO TB24
500.0000 mg | ORAL_TABLET | Freq: Two times a day (BID) | ORAL | 0 refills | Status: DC
  Filled 2022-04-26: qty 180, 90d supply, fill #0

## 2022-04-27 ENCOUNTER — Other Ambulatory Visit: Payer: Self-pay

## 2022-04-27 MED ORDER — AREXVY 120 MCG/0.5ML IM SUSR
INTRAMUSCULAR | 0 refills | Status: DC
  Filled 2022-04-27: qty 1, 1d supply, fill #0

## 2022-05-10 ENCOUNTER — Other Ambulatory Visit: Payer: Self-pay | Admitting: Primary Care

## 2022-05-10 DIAGNOSIS — E1140 Type 2 diabetes mellitus with diabetic neuropathy, unspecified: Secondary | ICD-10-CM

## 2022-05-10 DIAGNOSIS — R000 Tachycardia, unspecified: Secondary | ICD-10-CM

## 2022-05-10 DIAGNOSIS — E785 Hyperlipidemia, unspecified: Secondary | ICD-10-CM

## 2022-05-15 ENCOUNTER — Other Ambulatory Visit: Payer: Self-pay

## 2022-05-15 MED FILL — Glipizide Tab 5 MG: ORAL | 90 days supply | Qty: 180 | Fill #0 | Status: AC

## 2022-05-18 ENCOUNTER — Other Ambulatory Visit: Payer: Self-pay

## 2022-05-18 MED ORDER — NOVOFINE PEN NEEDLE 32G X 6 MM MISC
3 refills | Status: DC
  Filled 2022-05-18: qty 100, 100d supply, fill #0

## 2022-05-18 MED ORDER — INSULIN GLARGINE 100 UNIT/ML SOLOSTAR PEN
20.0000 [IU] | PEN_INJECTOR | Freq: Every evening | SUBCUTANEOUS | 3 refills | Status: DC
  Filled 2022-05-18 – 2022-07-20 (×2): qty 30, 150d supply, fill #0

## 2022-05-18 MED ORDER — OZEMPIC (0.25 OR 0.5 MG/DOSE) 2 MG/3ML ~~LOC~~ SOPN
PEN_INJECTOR | SUBCUTANEOUS | 3 refills | Status: DC
  Filled 2022-05-18: qty 12, 112d supply, fill #0

## 2022-05-30 ENCOUNTER — Other Ambulatory Visit: Payer: Self-pay

## 2022-05-30 MED ORDER — INFLUENZA VAC A&B SA ADJ QUAD 0.5 ML IM PRSY
PREFILLED_SYRINGE | INTRAMUSCULAR | 0 refills | Status: DC
  Filled 2022-05-30: qty 0.5, 1d supply, fill #0

## 2022-05-30 MED FILL — Atorvastatin Calcium Tab 40 MG (Base Equivalent): ORAL | 90 days supply | Qty: 90 | Fill #0 | Status: AC

## 2022-05-30 MED FILL — Metoprolol Succinate Tab ER 24HR 25 MG (Tartrate Equiv): ORAL | 90 days supply | Qty: 90 | Fill #0 | Status: AC

## 2022-06-11 ENCOUNTER — Other Ambulatory Visit: Payer: Self-pay

## 2022-06-11 MED ORDER — NOVOFINE PLUS PEN NEEDLE 32G X 4 MM MISC
3 refills | Status: DC
  Filled 2022-09-19: qty 100, 90d supply, fill #0
  Filled 2022-09-20: qty 100, 25d supply, fill #0
  Filled 2022-11-13: qty 100, 25d supply, fill #1
  Filled 2023-03-27: qty 100, 25d supply, fill #2

## 2022-06-11 MED ORDER — OZEMPIC (0.25 OR 0.5 MG/DOSE) 2 MG/3ML ~~LOC~~ SOPN
PEN_INJECTOR | SUBCUTANEOUS | 3 refills | Status: DC
  Filled 2022-10-10: qty 9, 84d supply, fill #0

## 2022-06-11 MED ORDER — INSULIN GLARGINE 100 UNIT/ML SOLOSTAR PEN
20.0000 [IU] | PEN_INJECTOR | Freq: Every day | SUBCUTANEOUS | 3 refills | Status: DC
  Filled 2022-11-13: qty 15, 75d supply, fill #0
  Filled 2023-01-29: qty 15, 75d supply, fill #1

## 2022-06-14 ENCOUNTER — Other Ambulatory Visit: Payer: Self-pay

## 2022-06-29 ENCOUNTER — Other Ambulatory Visit: Payer: Self-pay

## 2022-06-29 MED ORDER — KETOROLAC TROMETHAMINE 0.5 % OP SOLN
1.0000 [drp] | Freq: Four times a day (QID) | OPHTHALMIC | 1 refills | Status: DC
  Filled 2022-06-29: qty 5, 25d supply, fill #0

## 2022-06-29 MED ORDER — PREDNISOLONE ACETATE 1 % OP SUSP
1.0000 [drp] | Freq: Four times a day (QID) | OPHTHALMIC | 1 refills | Status: DC
  Filled 2022-06-29: qty 5, 25d supply, fill #0

## 2022-06-29 MED ORDER — MOXIFLOXACIN HCL 0.5 % OP SOLN
1.0000 [drp] | Freq: Four times a day (QID) | OPHTHALMIC | 1 refills | Status: DC
  Filled 2022-06-29: qty 3, 15d supply, fill #0

## 2022-07-20 ENCOUNTER — Other Ambulatory Visit: Payer: Self-pay

## 2022-07-20 ENCOUNTER — Other Ambulatory Visit (HOSPITAL_BASED_OUTPATIENT_CLINIC_OR_DEPARTMENT_OTHER): Payer: Self-pay

## 2022-07-25 ENCOUNTER — Other Ambulatory Visit: Payer: Self-pay

## 2022-07-26 ENCOUNTER — Other Ambulatory Visit: Payer: Self-pay

## 2022-08-01 ENCOUNTER — Other Ambulatory Visit: Payer: Self-pay

## 2022-08-01 ENCOUNTER — Other Ambulatory Visit: Payer: Self-pay | Admitting: Primary Care

## 2022-08-01 DIAGNOSIS — E1140 Type 2 diabetes mellitus with diabetic neuropathy, unspecified: Secondary | ICD-10-CM

## 2022-08-01 MED ORDER — METFORMIN HCL ER 500 MG PO TB24
500.0000 mg | ORAL_TABLET | Freq: Two times a day (BID) | ORAL | 0 refills | Status: DC
  Filled 2022-08-01: qty 180, 90d supply, fill #0

## 2022-08-01 MED FILL — Glipizide Tab 5 MG: ORAL | 90 days supply | Qty: 180 | Fill #1 | Status: AC

## 2022-08-01 MED FILL — Glipizide Tab 5 MG: ORAL | 90 days supply | Qty: 180 | Fill #1 | Status: CN

## 2022-08-02 ENCOUNTER — Ambulatory Visit
Admission: RE | Admit: 2022-08-02 | Discharge: 2022-08-02 | Disposition: A | Discharge status: Home / Self Care | Payer: PPO | Source: Ambulatory Visit | Attending: Primary Care | Admitting: Primary Care

## 2022-08-02 DIAGNOSIS — E2839 Other primary ovarian failure: Secondary | ICD-10-CM

## 2022-08-07 ENCOUNTER — Other Ambulatory Visit: Payer: Self-pay

## 2022-08-20 HISTORY — PX: CATARACT EXTRACTION: SUR2

## 2022-08-28 ENCOUNTER — Telehealth: Payer: Self-pay | Admitting: Cardiovascular Disease

## 2022-08-28 ENCOUNTER — Other Ambulatory Visit: Payer: Self-pay

## 2022-08-28 NOTE — Telephone Encounter (Signed)
Left message that I called to see if the pt wanted to move her appt with Dr. Acie Fredrickson up sooner. At this moment there is an appt 08/31/22 @ 8:30, though cannot guarantee the appt will still be there when the pt calls back. Left message to call back if she wants to move her appt up sooner or if she wants to leave it as 09/13/22 will be fine as well.   I called the requesting office to confirm if Plavix is going to need to be held. I was able to confirm that they need Plavix to be held, preference would be x 5 days, however will accept recommendation from cardiologist. I did inform the office that I left a message for the pt to call back for a sooner appt.

## 2022-08-28 NOTE — Telephone Encounter (Signed)
   Pre-operative Risk Assessment    Patient Name: Alyssa Stone [facility]  DOB: [birth year] MRN: [id number]      Request for Surgical Clearance    Procedure:   Vitrectomy   Date of Surgery:  Clearance 09/17/22                                 Surgeon:  Dr. Sherlynn Stalls  Surgeon's Group or Practice Name:  [facility]  Phone number:  [phone] Fax number:  [phone]   Type of Clearance Requested:   - Medical    Type of Anesthesia:  MAC   Additional requests/questions:    SignedTrilby Drummer   08/28/2022, 12:13 PM

## 2022-08-28 NOTE — Telephone Encounter (Signed)
Pt called back and has been scheduled to see Ambrose Pancoast, NP 08/31/22 @ 9:15 for pre op clearance.

## 2022-08-28 NOTE — Telephone Encounter (Signed)
   Name: Alyssa Stone  DOB: 03-04-1956  MRN: [id number]  Primary Cardiologist: Mertie Moores, MD  Chart reviewed as part of pre-operative protocol coverage. Because of REIDA HEM past medical history and time since last visit, she will require a follow-up in-office visit in order to better assess preoperative cardiovascular risk.  Pre-op covering staff: - Please schedule appointment and call patient to inform them. If patient already had an upcoming appointment within acceptable timeframe, please add "pre-op clearance" to the appointment notes so provider is aware. - Please contact requesting surgeon's office via preferred method (i.e, phone, fax) to inform them of need for appointment prior to surgery.  No medications indicated as needing held.  Elgie Collard, PA-C  08/28/2022, 1:23 PM

## 2022-08-29 ENCOUNTER — Other Ambulatory Visit: Payer: Self-pay

## 2022-08-29 MED FILL — Metoprolol Succinate Tab ER 24HR 25 MG (Tartrate Equiv): ORAL | 90 days supply | Qty: 90 | Fill #1 | Status: AC

## 2022-08-29 MED FILL — Atorvastatin Calcium Tab 40 MG (Base Equivalent): ORAL | 90 days supply | Qty: 90 | Fill #1 | Status: AC

## 2022-08-30 ENCOUNTER — Ambulatory Visit (INDEPENDENT_AMBULATORY_CARE_PROVIDER_SITE_OTHER): Payer: PPO | Admitting: Primary Care

## 2022-08-30 ENCOUNTER — Encounter: Payer: Self-pay | Admitting: Primary Care

## 2022-08-30 VITALS — BP 122/66 | HR 88 | Temp 97.3°F | Ht 67.0 in | Wt 208.0 lb

## 2022-08-30 DIAGNOSIS — E1140 Type 2 diabetes mellitus with diabetic neuropathy, unspecified: Secondary | ICD-10-CM

## 2022-08-30 LAB — POCT GLYCOSYLATED HEMOGLOBIN (HGB A1C): Hemoglobin A1C: 7 % — AB (ref 4.0–5.6)

## 2022-08-30 NOTE — Progress Notes (Signed)
Office Visit    Patient Name: Alyssa Stone Date of Encounter: 08/31/2022  Primary Care Provider:  Pleas Koch, NP Primary Cardiologist:  Mertie Moores, MD Primary Electrophysiologist: None  Chief Complaint    Alyssa Stone is a 67 y.o. female with PMH of CAD s/p DES to proximal LAD, distal LAD 75% medical management, sinus tach (managed with beta-blocker), DM type II, HLD, GERD who presents today for preoperative clearance.  Past Medical History    Past Medical History:  Diagnosis Date   Allergy    Anemia    CAD (coronary artery disease)    s/p DES to o-pLAD in 09/2020 // dLAD 75 (small, med Rx)   Chicken pox    Depression    Diabetes mellitus    Fainting episodes    Gastroesophageal reflux disease    Hyperlipidemia    controlled om meds    Sinus tachycardia    Past Surgical History:  Procedure Laterality Date   ANAL FISSURE REPAIR     CESAREAN SECTION     CORONARY STENT INTERVENTION N/A 10/14/2020   Procedure: CORONARY STENT INTERVENTION;  Surgeon: Jettie Booze, MD;  Location: [facility];  Service: Cardiovascular;  Laterality: N/A;   INTRAVASCULAR IMAGING/OCT N/A 10/14/2020   Procedure: INTRAVASCULAR IMAGING/OCT;  Surgeon: Jettie Booze, MD;  Location: [facility];  Service: Cardiovascular;  Laterality: N/A;   INTRAVASCULAR PRESSURE WIRE/FFR STUDY N/A 10/14/2020   Procedure: INTRAVASCULAR PRESSURE WIRE/FFR STUDY;  Surgeon: Jettie Booze, MD;  Location: [facility];  Service: Cardiovascular;  Laterality: N/A;   LEFT HEART CATH AND CORONARY ANGIOGRAPHY N/A 10/14/2020   Procedure: LEFT HEART CATH AND CORONARY ANGIOGRAPHY;  Surgeon: Jettie Booze, MD;  Location: [facility];  Service: Cardiovascular;  Laterality: N/A;   TONSILLECTOMY Bilateral     Allergies  No Known Allergies  History of Present Illness    Alyssa Stone  is a 67year old female with the above mention past medical history who  presents today for preoperative clearance for upcoming vitrectomy procedure.  Ms. Reichardt was initially seen in 2022 by Dr. Acie Fredrickson for complaint of sinus tachycardia.  EKG was completed showing sinus tach with rate 127 bpm.  She was started on metoprolol.  She endorsed occasional episodes of chest tightness and coronary CTA was ordered that showed calcium score 0 with proximal LAD stenosis.  She was sent for [facility] that revealed 70% proximal LAD stenosis was treated with DES x 1 and distal LAD stenosis that was too small for stent and medically management.  She was last seen by Dr. Acie Fredrickson 05/2021 and reported feeling much better and was back exercising.  Ms. Nicoll presents today for preoperative clearance and annual follow-up alone.  Since last being seen in the office patient reports that she has been doing well with no new cardiac complaints since previous visit.  She has been compliant with her current medication regimen and denies any adverse reactions.  Her blood pressure today is well-controlled at 114/78.  She is staying physically active with walking every other day at least 1 and half miles.  She denies any palpitations or skipped heartbeats since previous visit.  She is following a low-sodium heart healthy diet and is aware of avoiding processed foods.  During her visit we reviewed primary and secondary prevention for cardiovascular disease.  Patient denies chest pain, palpitations, dyspnea, PND, orthopnea, nausea, vomiting, dizziness, syncope, edema, weight gain, or early satiety.  Home Medications    Current Outpatient Medications  Medication Sig Dispense Refill   Ascorbic Acid (VITAMIN C) 1000 MG tablet Take 1,000 mg by mouth daily.     atorvastatin (LIPITOR) 40 MG tablet Take 1 tablet (40 mg total) by mouth every evening for cholestrol. 90 tablet 2   clopidogrel (PLAVIX) 75 MG tablet Take 1 tablet (75 mg total) by mouth daily. 90 tablet 3   Cyanocobalamin (VITAMIN B-12) 5000 MCG SUBL  Place 5,000 mcg under the tongue daily.     glipiZIDE (GLUCOTROL) 5 MG tablet Take 1 tablet (5 mg total) by mouth 2 (two) times daily before a meal for diabetes. 180 tablet 1   glucose blood (ONETOUCH VERIO) test strip USE TO TEST BLOOD SUGAR UP TO 4 TIMES DAILY 300 strip 3   influenza vaccine adjuvanted (FLUAD) 0.5 ML injection Inject into the muscle. 0.5 mL 0   insulin glargine (LANTUS) 100 UNIT/ML Solostar Pen Inject 20 units under the skin once a day every evening 30 mL 3   Insulin Pen Needle (B-D ULTRAFINE III SHORT PEN) 31G X 8 MM MISC USE NIGHTLY WITH INSULIN PEN FOR DIABETES. 100 each 3   Insulin Pen Needle (NOVOFINE PEN NEEDLE) 32G X 6 MM MISC Use one pen needle with ozempic once every week 100 each 3   ketorolac (ACULAR) 0.5 % ophthalmic solution Place 1 drop into the left eye 4 (four) times daily as directed. 5 mL 1   Lancets (ONETOUCH DELICA PLUS [id number]) MISC USE TO TEST BLOOD SUGAR UP TO 4 TIMES A DAY 300 each 5   Magnesium 250 MG TABS      metFORMIN (GLUCOPHAGE-XR) 500 MG 24 hr tablet Take 1 tablet (500 mg total) by mouth 2 (two) times daily. for diabetes. 180 tablet 0   metoprolol succinate (TOPROL-XL) 25 MG 24 hr tablet Take 1 tablet (25 mg total) by mouth daily for heart rate. 90 tablet 2   moxifloxacin (VIGAMOX) 0.5 % ophthalmic solution Place 1 drop into the left eye 4 (four) times daily as directed. 3 mL 1   nitroGLYCERIN (NITROSTAT) 0.4 MG SL tablet Place 1 tablet (0.4 mg total) under the tongue every 5 (five) minutes as needed for chest pain. 25 tablet 3   prednisoLONE acetate (PRED FORTE) 1 % ophthalmic suspension Place 1 drop into the left eye 4 (four) times daily as directed. Shake well before use. 5 mL 1   RSV vaccine recomb adjuvanted (AREXVY) 120 MCG/0.5ML injection Inject into the muscle. 1 each 0   scopolamine (TRANSDERM-SCOP) 1 MG/3DAYS Place 1 patch onto the skin every 3 (three) days. 10 patch 0   Semaglutide,0.25 or 0.'5MG'$ /DOS, (OZEMPIC, 0.25 OR 0.5 MG/DOSE,) 2  MG/3ML SOPN Inject 0.'25mg'$  into the skin once a week for 4 weeks, then 0.'5mg'$  weekly thereafter 12 mL 3   tobramycin (TOBREX) 0.3 % ophthalmic solution PLEASE SEE ATTACHED FOR DETAILED DIRECTIONS     zinc gluconate 50 MG tablet Take 50 mg by mouth daily.     No current facility-administered medications for this visit.     Review of Systems  Please see the history of present illness.     All other systems reviewed and are otherwise negative except as noted above.  Physical Exam    Wt Readings from Last 3 Encounters:  08/31/22 208 lb 6.4 oz (94.5 kg)  08/30/22 208 lb (94.3 kg)  02/22/22 210 lb (95.3 kg)   VS: Vitals:   08/31/22 0922  BP: 114/78  Pulse: 75  SpO2: 98%  ,Body mass index is 32.64 kg/m.  Constitutional:      Appearance: Healthy appearance. Not in distress.  Neck:     Vascular: JVD normal.  Pulmonary:     Effort: Pulmonary effort is normal.     Breath sounds: No wheezing. No rales. Diminished in the bases Cardiovascular:     Normal rate. Regular rhythm. Normal S1. Normal S2.      Murmurs: There is no murmur.  Edema:    Peripheral edema absent.  Abdominal:     Palpations: Abdomen is soft non tender. There is no hepatomegaly.  Skin:    General: Skin is warm and dry.  Neurological:     General: No focal deficit present.     Mental Status: Alert and oriented to person, place and time.     Cranial Nerves: Cranial nerves are intact.  EKG/LABS/Other Studies Reviewed    ECG personally reviewed by me today -sinus rhythm with left axis deviation and rate of 75 bpm with no acute changes consistent with previous EKG.   Lab Results  Component Value Date   WBC 10.2 02/22/2022   HGB 13.3 02/22/2022   HCT 39.2 02/22/2022   MCV 91.8 02/22/2022   PLT 296.0 02/22/2022   Lab Results  Component Value Date   CREATININE 0.56 02/22/2022   BUN 13 02/22/2022   NA 138 02/22/2022   K 4.4 02/22/2022   CL 100 02/22/2022   CO2 30 02/22/2022   Lab Results  Component  Value Date   ALT 10 02/22/2022   AST 11 02/22/2022   ALKPHOS 55 02/22/2022   BILITOT 0.3 02/22/2022   Lab Results  Component Value Date   CHOL 103 02/22/2022   HDL 36.50 (L) 02/22/2022   LDLCALC 41 02/22/2022   LDLDIRECT 137.0 09/03/2019   TRIG 130.0 02/22/2022   CHOLHDL 3 02/22/2022    Lab Results  Component Value Date   HGBA1C 7.0 (A) 08/30/2022    Assessment & Plan    1.  Preoperative clearance:  Ms. Winders perioperative risk of a major cardiac event is 0.9% according to the Revised Cardiac Risk Index (RCRI).  Therefore, she is at low risk for perioperative complications.   Her functional capacity is good at 5.07 METs according to the [place] Activity Status Index (DASI). Recommendations: According to ACC/AHA guidelines, no further cardiovascular testing needed.  The patient may proceed to surgery at acceptable risk.   Antiplatelet and/or Anticoagulation Recommendations: Aspirin can be held for 7 days days prior to her surgery.  Please resume Aspirin post operatively when it is felt to be safe from a bleeding standpoint.  -Patient is fine to hold Plavix for 7 days and please resume postoperatively when felt to be safe from a bleeding standpoint.  2.  Coronary artery disease: -s/p LHC completed 2022 with DES x 1 to proximal LAD and 75% distal LAD lesion treated medically -Today patient reports no chest pain or anginal equivalent since previous visit. -Continue current GDMT with Lipitor 40 mg, Plavix 75 mg and Toprol XL 75 mg  3.  Hyperlipidemia: -Patient's last LDL cholesterol was 41 at goal -Continue atorvastatin as noted above  4.  History of sinus tachycardia -Patient reports no recurrence of palpitations since previous visit. -Continue Toprol-XL as noted above.  \ Disposition: Follow-up with Mertie Moores, MD or APP in 12 months    Medication Adjustments/Labs and Tests Ordered: Current medicines are reviewed at length with the patient today.  Concerns  regarding medicines are  outlined above.   Signed, Mable Fill, Marissa Nestle, NP 08/31/2022, 9:46 AM [facility]  Note:  This document was prepared using Dragon voice recognition software and may include unintentional dictation errors.

## 2022-08-30 NOTE — Assessment & Plan Note (Signed)
Controlled and improved with A1C of 7.0 today!  Continue Ozempic 0.25 mg weekly, metformin XR 500 mg BID, Lantus 20 units HS. Increase Ozempic to 0.5 mg weekly after cataract surgery.   Consider titration off Lantus if glucose levels continue to improve.  Foot exam today.  Follow up in 6 months.

## 2022-08-30 NOTE — Progress Notes (Signed)
Subjective:    Patient ID: Alyssa Stone, female    DOB: 10-11-1955, 67 y.o.   MRN: [id number]  HPI  Alyssa Stone is a very pleasant 67 y.o. female  has a past medical history of Allergy, Anemia, CAD (coronary artery disease), Chicken pox, Depression, Diabetes mellitus, Fainting episodes, Gastroesophageal reflux disease, Hyperlipidemia, and Sinus tachycardia. who presents today for follow up of diabetes.  Current medications include: Lantus 20 units HS, metformin XR 500 mg BID, Ozempic 0.25 mg weekly. Overall she's tolerated the Ozempic well since her initial doses, has noticed constipation and intermittent nausea that is tolerable.   She is checking her blood glucose 1 times daily and is getting readings ranging 80's-150's.   Last A1C: 7.6 in July 2023, 7.0 today  Last Eye Exam: UTD Last Foot Exam: Due  Pneumonia Vaccination: 2022 Urine Microalbumin: UTD Statin: atorvastatin   Dietary changes since last visit: Working to cut back on sugars and carbs.    Exercise: Walking every other day      Review of Systems  Eyes:  Positive for visual disturbance.       Cataracts   Respiratory:  Negative for shortness of breath.   Cardiovascular:  Negative for chest pain.  Neurological:  Positive for numbness.         Past Medical History:  Diagnosis Date   Allergy    Anemia    CAD (coronary artery disease)    s/p DES to o-pLAD in 09/2020 // dLAD 75 (small, med Rx)   Chicken pox    Depression    Diabetes mellitus    Fainting episodes    Gastroesophageal reflux disease    Hyperlipidemia    controlled om meds    Sinus tachycardia     Social History   Socioeconomic History   Marital status: Divorced    Spouse name: Not on file   Number of children: 3   Years of education: Not on file   Highest education level: 12th grade  Occupational History   Not on file  Tobacco Use   Smoking status: Former   Smokeless tobacco: Never  Substance and Sexual Activity    Alcohol use: Not Currently   Drug use: Never   Sexual activity: Not Currently  Other Topics Concern   Not on file  Social History Narrative   Not on file   Social Determinants of Health   Financial Resource Strain: Low Risk  (02/07/2022)   Overall Financial Resource Strain (CARDIA)    Difficulty of Paying Living Expenses: Not hard at all  Food Insecurity: No Food Insecurity (02/07/2022)   Hunger Vital Sign    Worried About Running Out of Food in the Last Year: Never true    Ran Out of Food in the Last Year: Never true  Transportation Needs: No Transportation Needs (02/07/2022)   PRAPARE - Hydrologist (Medical): No    Lack of Transportation (Non-Medical): No  Physical Activity: Insufficiently Active (02/07/2022)   Exercise Vital Sign    Days of Exercise per Week: 2 days    Minutes of Exercise per Session: 20 min  Stress: No Stress Concern Present (02/07/2022)   [place]    Feeling of Stress : Not at all  Social Connections: Socially Isolated (02/07/2022)   Social Connection and Isolation Panel [NHANES]    Frequency of Communication with Friends and Family: Once a week    Frequency of  Social Gatherings with Friends and Family: Once a week    Attends Religious Services: Never    Marine scientist or Organizations: No    Attends Archivist Meetings: Never    Marital Status: Divorced  Human resources officer Violence: Not At Risk (02/07/2022)   Humiliation, Afraid, Rape, and Kick questionnaire    Fear of Current or Ex-Partner: No    Emotionally Abused: No    Physically Abused: No    Sexually Abused: No    Past Surgical History:  Procedure Laterality Date   ANAL FISSURE REPAIR     CESAREAN SECTION     CORONARY STENT INTERVENTION N/A 10/14/2020   Procedure: CORONARY STENT INTERVENTION;  Surgeon: Jettie Booze, MD;  Location: [facility];  Service: Cardiovascular;   Laterality: N/A;   INTRAVASCULAR IMAGING/OCT N/A 10/14/2020   Procedure: INTRAVASCULAR IMAGING/OCT;  Surgeon: Jettie Booze, MD;  Location: [facility];  Service: Cardiovascular;  Laterality: N/A;   INTRAVASCULAR PRESSURE WIRE/FFR STUDY N/A 10/14/2020   Procedure: INTRAVASCULAR PRESSURE WIRE/FFR STUDY;  Surgeon: Jettie Booze, MD;  Location: [facility];  Service: Cardiovascular;  Laterality: N/A;   LEFT HEART CATH AND CORONARY ANGIOGRAPHY N/A 10/14/2020   Procedure: LEFT HEART CATH AND CORONARY ANGIOGRAPHY;  Surgeon: Jettie Booze, MD;  Location: [facility];  Service: Cardiovascular;  Laterality: N/A;   TONSILLECTOMY Bilateral     Family History  Problem Relation Age of Onset   Cancer Mother    Diabetes Mother    Miscarriages / [place] Mother    Hypertension Father    Heart attack Father    Cancer Sister    Diabetes Sister    Diabetes Brother    Heart attack Brother    Kidney disease Brother    Hyperlipidemia Brother    Colon cancer Neg Hx    Colon polyps Neg Hx    Esophageal cancer Neg Hx    Rectal cancer Neg Hx    Stomach cancer Neg Hx     No Known Allergies  Current Outpatient Medications on File Prior to Visit  Medication Sig Dispense Refill   Ascorbic Acid (VITAMIN C) 1000 MG tablet Take 1,000 mg by mouth daily.     atorvastatin (LIPITOR) 40 MG tablet Take 1 tablet (40 mg total) by mouth every evening for cholestrol. 90 tablet 2   clopidogrel (PLAVIX) 75 MG tablet Take 1 tablet (75 mg total) by mouth daily. 90 tablet 3   Cyanocobalamin (VITAMIN B-12) 5000 MCG SUBL Place 5,000 mcg under the tongue daily.     glipiZIDE (GLUCOTROL) 5 MG tablet Take 1 tablet (5 mg total) by mouth 2 (two) times daily before a meal for diabetes. 180 tablet 1   glucose blood (ONETOUCH VERIO) test strip USE TO TEST BLOOD SUGAR UP TO 4 TIMES DAILY 300 strip 3   insulin glargine (LANTUS SOLOSTAR) 100 UNIT/ML Solostar Pen INJECT 20 UNITS INTO THE SKIN  EVERY EVENING. FOR DIABETES 30 mL 0   insulin glargine (LANTUS) 100 UNIT/ML Solostar Pen Inject 20 Units into the skin every evening. 30 mL 3   insulin glargine (LANTUS) 100 UNIT/ML Solostar Pen Inject 20 units under the skin once a day every evening 30 mL 3   Insulin Pen Needle (B-D ULTRAFINE III SHORT PEN) 31G X 8 MM MISC USE NIGHTLY WITH INSULIN PEN FOR DIABETES. 100 each 3   Insulin Pen Needle (NOVOFINE PEN NEEDLE) 32G X 6 MM MISC Use one pen  needle with Ozempic injection once every week 100 each 3   Insulin Pen Needle (NOVOFINE PEN NEEDLE) 32G X 6 MM MISC Use one pen needle with ozempic once every week 100 each 3   ketorolac (ACULAR) 0.5 % ophthalmic solution Place 1 drop into the left eye 4 (four) times daily as directed. 5 mL 1   Lancets (ONETOUCH DELICA PLUS [id number]) MISC USE TO TEST BLOOD SUGAR UP TO 4 TIMES A DAY 300 each 5   Magnesium 250 MG TABS      metFORMIN (GLUCOPHAGE-XR) 500 MG 24 hr tablet Take 1 tablet (500 mg total) by mouth 2 (two) times daily. for diabetes. 180 tablet 0   metoprolol succinate (TOPROL-XL) 25 MG 24 hr tablet Take 1 tablet (25 mg total) by mouth daily for heart rate. 90 tablet 2   moxifloxacin (VIGAMOX) 0.5 % ophthalmic solution Place 1 drop into the left eye 4 (four) times daily as directed. 3 mL 1   nitroGLYCERIN (NITROSTAT) 0.4 MG SL tablet Place 1 tablet (0.4 mg total) under the tongue every 5 (five) minutes as needed for chest pain. 25 tablet 3   prednisoLONE acetate (PRED FORTE) 1 % ophthalmic suspension Place 1 drop into the left eye 4 (four) times daily as directed. Shake well before use. 5 mL 1   RSV vaccine recomb adjuvanted (AREXVY) 120 MCG/0.5ML injection Inject into the muscle. 1 each 0   scopolamine (TRANSDERM-SCOP) 1 MG/3DAYS Place 1 patch onto the skin every 3 (three) days. 10 patch 0   Semaglutide,0.25 or 0.'5MG'$ /DOS, (OZEMPIC, 0.25 OR 0.5 MG/DOSE,) 2 MG/3ML SOPN Inject 0.25 mg into the skin once a week for 4 weeks, then 0.5 mg weekly thereafter 12  mL 3   Semaglutide,0.25 or 0.'5MG'$ /DOS, (OZEMPIC, 0.25 OR 0.5 MG/DOSE,) 2 MG/3ML SOPN Inject 0.'25mg'$  into the skin once a week for 4 weeks, then 0.'5mg'$  weekly thereafter 12 mL 3   tobramycin (TOBREX) 0.3 % ophthalmic solution PLEASE SEE ATTACHED FOR DETAILED DIRECTIONS     zinc gluconate 50 MG tablet Take 50 mg by mouth daily.     influenza vaccine adjuvanted (FLUAD) 0.5 ML injection Inject into the muscle. (Patient not taking: Reported on 08/30/2022) 0.5 mL 0   No current facility-administered medications on file prior to visit.    BP 122/66   Pulse 88   Temp (!) 97.3 F (36.3 C) (Temporal)   Ht '5\' 7"'$  (1.702 m)   Wt 208 lb (94.3 kg)   SpO2 94%   BMI 32.58 kg/m  Objective:   Physical Exam Cardiovascular:     Rate and Rhythm: Normal rate and regular rhythm.  Pulmonary:     Effort: Pulmonary effort is normal.     Breath sounds: Normal breath sounds.  Musculoskeletal:     Cervical back: Neck supple.  Skin:    General: Skin is warm and dry.           Assessment & Plan:  Type 2 diabetes mellitus with diabetic neuropathy, without long-term current use of insulin (HCC) Assessment & Plan: Controlled and improved with A1C of 7.0 today!  Continue Ozempic 0.25 mg weekly, metformin XR 500 mg BID, Lantus 20 units HS. Increase Ozempic to 0.5 mg weekly after cataract surgery.   Consider titration off Lantus if glucose levels continue to improve.  Foot exam today.  Follow up in 6 months.  Orders: -     POCT glycosylated hemoglobin (Hb A1C)        Pleas Koch, NP

## 2022-08-30 NOTE — Patient Instructions (Signed)
Increase your Ozempic to 0.5 mg weekly when ready.  Continue to monitor your glucose levels as discussed.  Please schedule a physical to meet with me in 6 months.   It was a pleasure to see you today!

## 2022-08-31 ENCOUNTER — Encounter: Payer: Self-pay | Admitting: Nurse Practitioner

## 2022-08-31 ENCOUNTER — Ambulatory Visit: Payer: PPO | Attending: Cardiovascular Disease | Admitting: Nurse Practitioner

## 2022-08-31 VITALS — BP 114/78 | HR 75 | Ht 67.0 in | Wt 208.4 lb

## 2022-08-31 DIAGNOSIS — E782 Mixed hyperlipidemia: Secondary | ICD-10-CM

## 2022-08-31 DIAGNOSIS — I2510 Atherosclerotic heart disease of native coronary artery without angina pectoris: Secondary | ICD-10-CM | POA: Diagnosis not present

## 2022-08-31 DIAGNOSIS — R000 Tachycardia, unspecified: Secondary | ICD-10-CM | POA: Diagnosis not present

## 2022-08-31 DIAGNOSIS — Z01810 Encounter for preprocedural cardiovascular examination: Secondary | ICD-10-CM | POA: Diagnosis not present

## 2022-08-31 NOTE — Patient Instructions (Signed)
Medication Instructions:  Your physician recommends that you continue on your current medications as directed. Please refer to the Current Medication list given to you today. *If you need a refill on your cardiac medications before your next appointment, please call your pharmacy*   Lab Work: None Ordered  Testing/Procedures: None Ordered   Follow-Up: At [facility], you and your health needs are our priority.  As part of our continuing mission to provide you with exceptional heart care, we have created designated Provider Care Teams.  These Care Teams include your primary Cardiologist (physician) and Advanced Practice Providers (APPs -  Physician Assistants and Nurse Practitioners) who all work together to provide you with the care you need, when you need it.  We recommend signing up for the patient portal called "MyChart".  Sign up information is provided on this After Visit Summary.  MyChart is used to connect with patients for Virtual Visits (Telemedicine).  Patients are able to view lab/test results, encounter notes, upcoming appointments, etc.  Non-urgent messages can be sent to your provider as well.   To learn more about what you can do with MyChart, go to [email or web address].    Your next appointment:   1 year(s)  Provider:   Mertie Moores, MD     Other Instructions

## 2022-09-06 ENCOUNTER — Ambulatory Visit: Payer: PPO | Admitting: Cardiovascular Disease

## 2022-09-13 ENCOUNTER — Ambulatory Visit: Payer: PPO | Admitting: Cardiovascular Disease

## 2022-09-19 ENCOUNTER — Other Ambulatory Visit: Payer: Self-pay

## 2022-09-20 ENCOUNTER — Other Ambulatory Visit: Payer: Self-pay

## 2022-09-21 ENCOUNTER — Other Ambulatory Visit: Payer: Self-pay

## 2022-09-27 ENCOUNTER — Other Ambulatory Visit: Payer: Self-pay

## 2022-09-27 MED ORDER — MOXIFLOXACIN HCL 0.5 % OP SOLN
1.0000 [drp] | Freq: Four times a day (QID) | OPHTHALMIC | 1 refills | Status: DC
  Filled 2022-09-27: qty 3, 15d supply, fill #0

## 2022-09-27 MED ORDER — KETOROLAC TROMETHAMINE 0.5 % OP SOLN
1.0000 [drp] | Freq: Four times a day (QID) | OPHTHALMIC | 1 refills | Status: DC
  Filled 2022-09-27: qty 5, 25d supply, fill #0

## 2022-09-27 MED ORDER — PREDNISOLONE ACETATE 1 % OP SUSP
1.0000 [drp] | Freq: Four times a day (QID) | OPHTHALMIC | 1 refills | Status: DC
  Filled 2022-09-27: qty 5, 25d supply, fill #0

## 2022-10-09 ENCOUNTER — Other Ambulatory Visit: Payer: Self-pay

## 2022-10-10 ENCOUNTER — Other Ambulatory Visit: Payer: Self-pay

## 2022-10-12 ENCOUNTER — Other Ambulatory Visit: Payer: Self-pay

## 2022-10-29 ENCOUNTER — Other Ambulatory Visit: Payer: Self-pay

## 2022-10-29 ENCOUNTER — Other Ambulatory Visit: Payer: Self-pay | Admitting: Cardiovascular Disease

## 2022-10-29 ENCOUNTER — Other Ambulatory Visit: Payer: Self-pay | Admitting: Primary Care

## 2022-10-29 DIAGNOSIS — E1140 Type 2 diabetes mellitus with diabetic neuropathy, unspecified: Secondary | ICD-10-CM

## 2022-10-29 MED ORDER — CLOPIDOGREL BISULFATE 75 MG PO TABS
75.0000 mg | ORAL_TABLET | Freq: Every day | ORAL | 3 refills | Status: DC
  Filled 2022-10-29: qty 90, 90d supply, fill #0
  Filled 2023-02-06: qty 90, 90d supply, fill #1
  Filled 2023-05-01: qty 90, 90d supply, fill #2
  Filled 2023-07-31: qty 90, 90d supply, fill #3

## 2022-10-29 MED ORDER — METFORMIN HCL ER 500 MG PO TB24
500.0000 mg | ORAL_TABLET | Freq: Two times a day (BID) | ORAL | 0 refills | Status: DC
  Filled 2022-10-29: qty 180, 90d supply, fill #0

## 2022-10-29 MED ORDER — XIIDRA 5 % OP SOLN
1.0000 [drp] | Freq: Two times a day (BID) | OPHTHALMIC | 4 refills | Status: DC
  Filled 2022-10-29: qty 180, 90d supply, fill #0
  Filled 2023-03-27: qty 180, 90d supply, fill #1

## 2022-10-30 ENCOUNTER — Other Ambulatory Visit: Payer: Self-pay

## 2022-11-13 ENCOUNTER — Other Ambulatory Visit: Payer: Self-pay | Admitting: Primary Care

## 2022-11-13 DIAGNOSIS — E1140 Type 2 diabetes mellitus with diabetic neuropathy, unspecified: Secondary | ICD-10-CM

## 2022-11-14 ENCOUNTER — Other Ambulatory Visit: Payer: Self-pay

## 2022-11-14 MED ORDER — GLIPIZIDE 5 MG PO TABS
5.0000 mg | ORAL_TABLET | Freq: Two times a day (BID) | ORAL | 1 refills | Status: DC
  Filled 2022-11-14: qty 180, 90d supply, fill #0
  Filled 2023-02-06: qty 180, 90d supply, fill #1

## 2022-11-16 ENCOUNTER — Other Ambulatory Visit: Payer: Self-pay | Admitting: Primary Care

## 2022-11-16 DIAGNOSIS — Z1231 Encounter for screening mammogram for malignant neoplasm of breast: Secondary | ICD-10-CM

## 2022-11-27 MED FILL — Atorvastatin Calcium Tab 40 MG (Base Equivalent): ORAL | 90 days supply | Qty: 90 | Fill #2 | Status: AC

## 2022-11-27 MED FILL — Metoprolol Succinate Tab ER 24HR 25 MG (Tartrate Equiv): ORAL | 90 days supply | Qty: 90 | Fill #2 | Status: AC

## 2022-12-19 ENCOUNTER — Other Ambulatory Visit: Payer: Self-pay

## 2022-12-27 ENCOUNTER — Other Ambulatory Visit: Payer: Self-pay

## 2023-01-03 ENCOUNTER — Encounter: Payer: Self-pay | Admitting: Gastroenterology

## 2023-01-03 ENCOUNTER — Ambulatory Visit
Admission: RE | Admit: 2023-01-03 | Discharge: 2023-01-03 | Disposition: A | Discharge status: Home / Self Care | Payer: PPO | Source: Ambulatory Visit | Attending: Primary Care | Admitting: Primary Care

## 2023-01-03 DIAGNOSIS — Z1231 Encounter for screening mammogram for malignant neoplasm of breast: Secondary | ICD-10-CM

## 2023-01-08 ENCOUNTER — Other Ambulatory Visit: Payer: Self-pay

## 2023-01-29 ENCOUNTER — Other Ambulatory Visit: Payer: Self-pay | Admitting: Primary Care

## 2023-01-29 ENCOUNTER — Other Ambulatory Visit: Payer: Self-pay

## 2023-01-29 DIAGNOSIS — E1140 Type 2 diabetes mellitus with diabetic neuropathy, unspecified: Secondary | ICD-10-CM

## 2023-01-29 MED ORDER — METFORMIN HCL ER 500 MG PO TB24
500.0000 mg | ORAL_TABLET | Freq: Two times a day (BID) | ORAL | 0 refills | Status: DC
  Filled 2023-01-29: qty 180, 90d supply, fill #0

## 2023-02-06 ENCOUNTER — Other Ambulatory Visit: Payer: Self-pay

## 2023-02-06 MED FILL — Lancets: 90 days supply | Qty: 300 | Fill #0 | Status: AC

## 2023-02-07 ENCOUNTER — Other Ambulatory Visit: Payer: Self-pay | Admitting: Primary Care

## 2023-02-07 ENCOUNTER — Other Ambulatory Visit: Payer: Self-pay

## 2023-02-07 DIAGNOSIS — Z794 Long term (current) use of insulin: Secondary | ICD-10-CM

## 2023-02-07 MED ORDER — ONETOUCH VERIO VI STRP
ORAL_STRIP | Freq: Four times a day (QID) | 3 refills | Status: AC
  Filled 2023-02-07: qty 300, 90d supply, fill #0
  Filled 2023-12-10: qty 300, 90d supply, fill #1

## 2023-02-08 ENCOUNTER — Other Ambulatory Visit: Payer: Self-pay

## 2023-02-14 ENCOUNTER — Other Ambulatory Visit: Payer: Self-pay

## 2023-02-14 MED ORDER — OZEMPIC (0.25 OR 0.5 MG/DOSE) 2 MG/3ML ~~LOC~~ SOPN: 0.5000 mg | PEN_INJECTOR | SUBCUTANEOUS | 3 refills | Status: DC

## 2023-02-18 ENCOUNTER — Other Ambulatory Visit: Payer: Self-pay

## 2023-02-19 ENCOUNTER — Other Ambulatory Visit: Payer: Self-pay

## 2023-02-19 MED ORDER — INSULIN GLARGINE 100 UNIT/ML SOLOSTAR PEN
20.0000 [IU] | PEN_INJECTOR | Freq: Every evening | SUBCUTANEOUS | 3 refills | Status: DC
  Filled 2023-03-27: qty 30, 150d supply, fill #0
  Filled 2023-08-27: qty 15, 75d supply, fill #1

## 2023-02-20 ENCOUNTER — Other Ambulatory Visit: Payer: Self-pay | Admitting: Primary Care

## 2023-02-20 ENCOUNTER — Other Ambulatory Visit: Payer: Self-pay

## 2023-02-20 DIAGNOSIS — E785 Hyperlipidemia, unspecified: Secondary | ICD-10-CM

## 2023-02-20 DIAGNOSIS — R000 Tachycardia, unspecified: Secondary | ICD-10-CM

## 2023-02-20 MED ORDER — METOPROLOL SUCCINATE ER 25 MG PO TB24
25.0000 mg | ORAL_TABLET | Freq: Every day | ORAL | 0 refills | Status: DC
Start: 2023-02-20 — End: 2023-05-28
  Filled 2023-02-20: qty 90, 90d supply, fill #0

## 2023-02-20 MED ORDER — ATORVASTATIN CALCIUM 40 MG PO TABS
40.0000 mg | ORAL_TABLET | Freq: Every evening | ORAL | 0 refills | Status: DC
Start: 2023-02-20 — End: 2023-05-28
  Filled 2023-02-20: qty 90, 90d supply, fill #0

## 2023-02-20 NOTE — Telephone Encounter (Signed)
From: Cecil Cranker [place] To: Office of Doreene Nest, NP Sent: 02/20/2023 8:52 AM EDT Subject: Medication Renewal Request  Refills have been requested for the following medications:   tobramycin (TOBREX) 0.3 % ophthalmic solution  Preferred pharmacy: [facility] Delivery method: Pickup Preferred pick-up date and time: 02/22/2023 12:01 PM

## 2023-02-27 ENCOUNTER — Other Ambulatory Visit: Payer: Self-pay

## 2023-02-28 ENCOUNTER — Ambulatory Visit (INDEPENDENT_AMBULATORY_CARE_PROVIDER_SITE_OTHER): Payer: PPO | Admitting: Primary Care

## 2023-02-28 ENCOUNTER — Encounter: Payer: Self-pay | Admitting: Primary Care

## 2023-02-28 ENCOUNTER — Other Ambulatory Visit: Payer: Self-pay

## 2023-02-28 VITALS — BP 118/60 | HR 77 | Temp 97.6°F | Ht 67.0 in | Wt 211.0 lb

## 2023-02-28 DIAGNOSIS — Z1159 Encounter for screening for other viral diseases: Secondary | ICD-10-CM

## 2023-02-28 DIAGNOSIS — Z0000 Encounter for general adult medical examination without abnormal findings: Secondary | ICD-10-CM

## 2023-02-28 DIAGNOSIS — E1140 Type 2 diabetes mellitus with diabetic neuropathy, unspecified: Secondary | ICD-10-CM

## 2023-02-28 DIAGNOSIS — Z7984 Long term (current) use of oral hypoglycemic drugs: Secondary | ICD-10-CM

## 2023-02-28 DIAGNOSIS — R000 Tachycardia, unspecified: Secondary | ICD-10-CM

## 2023-02-28 DIAGNOSIS — I2510 Atherosclerotic heart disease of native coronary artery without angina pectoris: Secondary | ICD-10-CM

## 2023-02-28 DIAGNOSIS — E782 Mixed hyperlipidemia: Secondary | ICD-10-CM

## 2023-02-28 DIAGNOSIS — Z794 Long term (current) use of insulin: Secondary | ICD-10-CM

## 2023-02-28 LAB — LIPID PANEL
Cholesterol: 119 mg/dL (ref 0–200)
HDL: 33.8 mg/dL — ABNORMAL LOW (ref >39.00)
LDL Cholesterol: 47 mg/dL (ref 0–99)
NonHDL: 85
Total CHOL/HDL Ratio: 4
Triglycerides: 192 mg/dL — ABNORMAL HIGH (ref 0.0–149.0)
VLDL: 38.4 mg/dL (ref 0.0–40.0)

## 2023-02-28 LAB — HEMOGLOBIN A1C: Hgb A1c MFr Bld: 7.9 % — ABNORMAL HIGH (ref 4.6–6.5)

## 2023-02-28 LAB — COMPREHENSIVE METABOLIC PANEL
ALT: 10 U/L (ref 0–35)
AST: 13 U/L (ref 0–37)
Albumin: 4.2 g/dL (ref 3.5–5.2)
Alkaline Phosphatase: 70 U/L (ref 39–117)
BUN: 16 mg/dL (ref 6–23)
CO2: 27 mEq/L (ref 19–32)
Calcium: 9.9 mg/dL (ref 8.4–10.5)
Chloride: 101 mEq/L (ref 96–112)
Creatinine, Ser: 0.58 mg/dL (ref 0.40–1.20)
GFR: 92.95 mL/min (ref >60.00)
Glucose, Bld: 115 mg/dL — ABNORMAL HIGH (ref 70–99)
Potassium: 4.9 mEq/L (ref 3.5–5.1)
Sodium: 137 mEq/L (ref 135–145)
Total Bilirubin: 0.5 mg/dL (ref 0.2–1.2)
Total Protein: 6.9 g/dL (ref 6.0–8.3)

## 2023-02-28 LAB — MICROALBUMIN / CREATININE URINE RATIO
Creatinine,U: 75.5 mg/dL
Microalb Creat Ratio: 1 mg/g (ref 0.0–30.0)
Microalb, Ur: 0.8 mg/dL (ref 0.0–1.9)

## 2023-02-28 NOTE — Assessment & Plan Note (Signed)
Following with cardiology, last office visit notes reviewed from January 2024.  Continue atorvastatin 40 mg daily, clopidogrel 75 mg daily, aspirin 81 mg daily. Continue blood pressure control and diabetes control.

## 2023-02-28 NOTE — Patient Instructions (Signed)
Stop by the lab prior to leaving today. I will notify you of your results once received.   Stop Ozempic for diabetes treatment.  Please schedule a follow up visit for 6 months for a diabetes check.  It was a pleasure to see you today!

## 2023-02-28 NOTE — Assessment & Plan Note (Signed)
Immunizations UTD. Mammogram up-to-date Colonoscopy due, she is aware and will schedule  Discussed the importance of a healthy diet and regular exercise in order for weight loss, and to reduce the risk of further co-morbidity.  Exam stable. Labs pending.  Follow up in 1 year for repeat physical.

## 2023-02-28 NOTE — Progress Notes (Signed)
Subjective:    Patient ID: Alyssa Stone, female    DOB: 03/07/1956, 67 y.o.   MRN: [id number]  HPI  Alyssa Stone is a very pleasant 67 y.o. female who presents today for complete physical and follow up of chronic conditions.  Immunizations: -Shingles: Completed Shingrix series -Pneumonia: Completed Prevnar 13 in 2021, Pneumovax 23 in 2022  Diet: Fair diet.  Exercise: No regular exercise.  Eye exam: Completes annually  Dental exam: Completed years ago  Mammogram: May 2024 Bone Density Scan: December 2023  Colonoscopy: Completed in 2021, due now. She is aware and will schedule.   BP Readings from Last 3 Encounters:  02/28/23 118/60  08/31/22 114/78  08/30/22 122/66   Wt Readings from Last 3 Encounters:  02/28/23 211 lb (95.7 kg)  08/31/22 208 lb 6.4 oz (94.5 kg)  08/30/22 208 lb (94.3 kg)       Review of Systems  Constitutional:  Negative for unexpected weight change.  HENT:  Negative for rhinorrhea.   Respiratory:  Negative for cough and shortness of breath.   Cardiovascular:  Negative for chest pain.  Gastrointestinal:  Positive for constipation. Negative for diarrhea.  Genitourinary:  Negative for difficulty urinating.  Musculoskeletal:  Negative for arthralgias and myalgias.  Skin:  Negative for rash.  Allergic/Immunologic: Negative for environmental allergies.  Neurological:  Negative for dizziness, numbness and headaches.  Psychiatric/Behavioral:  The patient is not nervous/anxious.          Past Medical History:  Diagnosis Date   Allergy    Anemia    CAD (coronary artery disease)    s/p DES to o-pLAD in 09/2020 // dLAD 75 (small, med Rx)   Cataract    Chicken pox    Depression    Diabetes mellitus    Fainting episodes    Gastroesophageal reflux disease    Hyperlipidemia    controlled om meds    Sinus tachycardia     Social History   Socioeconomic History   Marital status: Divorced    Spouse name: Not on file   Number of  children: 3   Years of education: Not on file   Highest education level: 12th grade  Occupational History   Not on file  Tobacco Use   Smoking status: Former    Current packs/day: 0.00    Types: Cigarettes    Quit date: 02/17/1973    Years since quitting: 50.0   Smokeless tobacco: Never  Substance and Sexual Activity   Alcohol use: Not Currently   Drug use: Never   Sexual activity: Not Currently    Birth control/protection: None  Other Topics Concern   Not on file  Social History Narrative   Not on file   Social Determinants of Health   Financial Resource Strain: Low Risk  (02/07/2022)   Overall Financial Resource Strain (CARDIA)    Difficulty of Paying Living Expenses: Not hard at all  Food Insecurity: No Food Insecurity (02/07/2022)   Hunger Vital Sign    Worried About Running Out of Food in the Last Year: Never true    Ran Out of Food in the Last Year: Never true  Transportation Needs: No Transportation Needs (02/07/2022)   PRAPARE - Administrator, Civil Service (Medical): No    Lack of Transportation (Non-Medical): No  Physical Activity: Insufficiently Active (02/07/2022)   Exercise Vital Sign    Days of Exercise per Week: 2 days    Minutes of Exercise per Session: 20  min  Stress: No Stress Concern Present (02/07/2022)   [employer] of Occupational Health - Occupational Stress Questionnaire    Feeling of Stress : Not at all  Social Connections: Socially Isolated (02/07/2022)   Social Connection and Isolation Panel [NHANES]    Frequency of Communication with Friends and Family: Once a week    Frequency of Social Gatherings with Friends and Family: Once a week    Attends Religious Services: Never    Database administrator or Organizations: No    Attends Banker Meetings: Never    Marital Status: Divorced  Catering manager Violence: Not At Risk (02/07/2022)   Humiliation, Afraid, Rape, and Kick questionnaire    Fear of Current or Ex-Partner:  No    Emotionally Abused: No    Physically Abused: No    Sexually Abused: No    Past Surgical History:  Procedure Laterality Date   ANAL FISSURE REPAIR     CATARACT EXTRACTION Bilateral 08/20/2022   CESAREAN SECTION     CORONARY IMAGING/OCT N/A 10/14/2020   Procedure: INTRAVASCULAR IMAGING/OCT;  Surgeon: Corky Crafts, MD;  Location: [facility];  Service: Cardiovascular;  Laterality: N/A;   CORONARY PRESSURE/FFR STUDY N/A 10/14/2020   Procedure: INTRAVASCULAR PRESSURE WIRE/FFR STUDY;  Surgeon: Corky Crafts, MD;  Location: [facility];  Service: Cardiovascular;  Laterality: N/A;   CORONARY STENT INTERVENTION N/A 10/14/2020   Procedure: CORONARY STENT INTERVENTION;  Surgeon: Corky Crafts, MD;  Location: [facility];  Service: Cardiovascular;  Laterality: N/A;   EYE SURGERY     LEFT HEART CATH AND CORONARY ANGIOGRAPHY N/A 10/14/2020   Procedure: LEFT HEART CATH AND CORONARY ANGIOGRAPHY;  Surgeon: Corky Crafts, MD;  Location: [facility];  Service: Cardiovascular;  Laterality: N/A;   TONSILLECTOMY Bilateral    TUBAL LIGATION      Family History  Problem Relation Age of Onset   Cancer Mother    Diabetes Mother    Miscarriages / [place] Mother    Hypertension Father    Heart attack Father    Cancer Sister    Diabetes Sister    Diabetes Brother    Heart attack Brother    Kidney disease Brother    Hyperlipidemia Brother    Colon cancer Neg Hx    Colon polyps Neg Hx    Esophageal cancer Neg Hx    Rectal cancer Neg Hx    Stomach cancer Neg Hx     No Known Allergies  Current Outpatient Medications on File Prior to Visit  Medication Sig Dispense Refill   Ascorbic Acid (VITAMIN C) 1000 MG tablet Take 1,000 mg by mouth daily.     atorvastatin (LIPITOR) 40 MG tablet Take 1 tablet (40 mg total) by mouth every evening for cholestrol. 90 tablet 0   clopidogrel (PLAVIX) 75 MG tablet Take 1 tablet (75 mg total) by mouth  daily. 90 tablet 3   Cyanocobalamin (VITAMIN B-12) 5000 MCG SUBL Place 5,000 mcg under the tongue daily.     glipiZIDE (GLUCOTROL) 5 MG tablet Take 1 tablet (5 mg total) by mouth 2 (two) times daily before a meal for diabetes. 180 tablet 1   glucose blood (ONETOUCH VERIO) test strip use to test blood sugar up to 4 (four) times daily. 300 strip 3   influenza vaccine adjuvanted (FLUAD) 0.5 ML injection Inject into the muscle. 0.5 mL 0   insulin glargine (LANTUS) 100 UNIT/ML Solostar Pen Inject 20 Units  into the skin daily in the evening. 30 mL 3   insulin glargine (LANTUS) 100 UNIT/ML Solostar Pen Inject 20 Units into the skin daily every evening for diabetes. 30 mL 3   Insulin Pen Needle (B-D ULTRAFINE III SHORT PEN) 31G X 8 MM MISC USE NIGHTLY WITH INSULIN PEN FOR DIABETES. 100 each 3   Lancets (ONETOUCH DELICA PLUS LANCET30G) MISC USE TO TEST BLOOD SUGAR UP TO 4 TIMES A DAY 300 each 5   Lifitegrast (XIIDRA) 5 % SOLN Place 1 drop into both eyes 2 (two) times daily. 180 each 4   Magnesium 250 MG TABS      metFORMIN (GLUCOPHAGE-XR) 500 MG 24 hr tablet Take 1 tablet (500 mg total) by mouth 2 (two) times daily. for diabetes. 180 tablet 0   metoprolol succinate (TOPROL-XL) 25 MG 24 hr tablet Take 1 tablet (25 mg total) by mouth daily for heart rate. 90 tablet 0   RSV vaccine recomb adjuvanted (AREXVY) 120 MCG/0.5ML injection Inject into the muscle. 1 each 0   tobramycin (TOBREX) 0.3 % ophthalmic solution PLEASE SEE ATTACHED FOR DETAILED DIRECTIONS     zinc gluconate 50 MG tablet Take 50 mg by mouth daily.     Insulin Pen Needle (NOVOFINE PLUS PEN NEEDLE) 32G X 4 MM MISC Use once weekly as directed (Patient not taking: Reported on 02/28/2023) 100 each 3   nitroGLYCERIN (NITROSTAT) 0.4 MG SL tablet Place 1 tablet (0.4 mg total) under the tongue every 5 (five) minutes as needed for chest pain. (Patient not taking: Reported on 02/28/2023) 25 tablet 3   No current facility-administered medications on file  prior to visit.    BP 118/60   Pulse 77   Temp 97.6 F (36.4 C) (Temporal)   Ht 5\' 7"  (1.702 m)   Wt 211 lb (95.7 kg)   SpO2 97%   BMI 33.05 kg/m  Objective:   Physical Exam HENT:     Right Ear: Tympanic membrane and ear canal normal.     Left Ear: Tympanic membrane and ear canal normal.     Nose: Nose normal.  Eyes:     Conjunctiva/sclera: Conjunctivae normal.     Pupils: Pupils are equal, round, and reactive to light.  Neck:     Thyroid: No thyromegaly.  Cardiovascular:     Rate and Rhythm: Normal rate and regular rhythm.     Heart sounds: No murmur heard. Pulmonary:     Effort: Pulmonary effort is normal.     Breath sounds: Normal breath sounds. No rales.  Abdominal:     General: Bowel sounds are normal.     Palpations: Abdomen is soft.     Tenderness: There is no abdominal tenderness.  Musculoskeletal:        General: Normal range of motion.     Cervical back: Neck supple.  Lymphadenopathy:     Cervical: No cervical adenopathy.  Skin:    General: Skin is warm and dry.     Findings: No rash.  Neurological:     Mental Status: She is alert and oriented to person, place, and time.     Cranial Nerves: No cranial nerve deficit.     Deep Tendon Reflexes: Reflexes are normal and symmetric.  Psychiatric:        Mood and Affect: Mood normal.           Assessment & Plan:  Healthcare maintenance Assessment & Plan: Immunizations UTD. Mammogram up-to-date Colonoscopy due, she is aware and will schedule  Discussed the importance of a healthy diet and regular exercise in order for weight loss, and to reduce the risk of further co-morbidity.  Exam stable. Labs pending.  Follow up in 1 year for repeat physical.    CAD in native artery Assessment & Plan: Following with cardiology, last office visit notes reviewed from January 2024.  Continue atorvastatin 40 mg daily, clopidogrel 75 mg daily, aspirin 81 mg daily. Continue blood pressure control and diabetes  control.   Sinus tachycardia Assessment & Plan: Controlled.  Continued metoprolol succinate 25 mg daily. Reviewed cardiology notes from January 2024.   Type 2 diabetes mellitus with diabetic neuropathy, without long-term current use of insulin (HCC) Assessment & Plan: Repeat A1C pending. Glucose levels are ranging low 100's in AM. She could not tolerate the dose increase of Ozempic to 0.5 mg weekly.  She is also experiencing constipation on 0.25 mg weekly.  Continue Lantus 20 units daily, Glipizide 5 mg BID, metformin 500 mg BID. Discontinue Ozempic given constipation and intolerance on higher doses.  Follow up in 3-6 months.   Orders: -     Microalbumin / creatinine urine ratio -     Hemoglobin A1c  Mixed hyperlipidemia Assessment & Plan: Repeat lipid panel pending.  Discussed the importance of a healthy diet and regular exercise in order for weight loss, and to reduce the risk of further co-morbidity. Continue atorvastatin 40 mg daily, Plavix 75 mg daily.  Orders: -     Lipid panel -     Comprehensive metabolic panel  Encounter for hepatitis C screening test for low risk patient -     Hepatitis C antibody        Doreene Nest, NP

## 2023-02-28 NOTE — Assessment & Plan Note (Addendum)
Repeat A1C pending. Glucose levels are ranging low 100's in AM. She could not tolerate the dose increase of Ozempic to 0.5 mg weekly.  She is also experiencing constipation on 0.25 mg weekly.  Continue Lantus 20 units daily, Glipizide 5 mg BID, metformin 500 mg BID. Discontinue Ozempic given constipation and intolerance on higher doses.  Follow up in 3-6 months.

## 2023-02-28 NOTE — Assessment & Plan Note (Signed)
Repeat lipid panel pending.  Discussed the importance of a healthy diet and regular exercise in order for weight loss, and to reduce the risk of further co-morbidity. Continue atorvastatin 40 mg daily, Plavix 75 mg daily.

## 2023-02-28 NOTE — Assessment & Plan Note (Signed)
Controlled.  Continued metoprolol succinate 25 mg daily. Reviewed cardiology notes from January 2024.

## 2023-03-01 ENCOUNTER — Other Ambulatory Visit: Payer: Self-pay

## 2023-03-01 DIAGNOSIS — E1140 Type 2 diabetes mellitus with diabetic neuropathy, unspecified: Secondary | ICD-10-CM

## 2023-03-01 LAB — HEPATITIS C ANTIBODY: Hepatitis C Ab: NONREACTIVE

## 2023-03-13 ENCOUNTER — Other Ambulatory Visit: Payer: Self-pay

## 2023-03-13 MED ORDER — TOBRAMYCIN 0.3 % OP SOLN
1.0000 [drp] | Freq: Four times a day (QID) | OPHTHALMIC | 5 refills | Status: AC
  Filled 2023-03-13: qty 5, 25d supply, fill #0
  Filled 2023-10-22: qty 5, 25d supply, fill #1

## 2023-03-25 MED ORDER — GLIPIZIDE 10 MG PO TABS
10.0000 mg | ORAL_TABLET | Freq: Two times a day (BID) | ORAL | 0 refills | Status: DC
Start: 2023-03-25 — End: 2023-07-03
  Filled 2023-03-25: qty 180, 90d supply, fill #0

## 2023-03-26 ENCOUNTER — Other Ambulatory Visit: Payer: Self-pay

## 2023-03-27 ENCOUNTER — Other Ambulatory Visit: Payer: Self-pay

## 2023-03-28 ENCOUNTER — Other Ambulatory Visit: Payer: Self-pay

## 2023-04-04 ENCOUNTER — Other Ambulatory Visit: Payer: Self-pay

## 2023-05-01 ENCOUNTER — Other Ambulatory Visit: Payer: Self-pay

## 2023-05-01 ENCOUNTER — Other Ambulatory Visit: Payer: Self-pay | Admitting: Primary Care

## 2023-05-01 DIAGNOSIS — E1140 Type 2 diabetes mellitus with diabetic neuropathy, unspecified: Secondary | ICD-10-CM

## 2023-05-01 MED ORDER — METFORMIN HCL ER 500 MG PO TB24
500.0000 mg | ORAL_TABLET | Freq: Two times a day (BID) | ORAL | 1 refills | Status: DC
Start: 2023-05-01 — End: 2023-10-15
  Filled 2023-05-01: qty 180, 90d supply, fill #0
  Filled 2023-07-31: qty 180, 90d supply, fill #1

## 2023-05-02 ENCOUNTER — Ambulatory Visit (INDEPENDENT_AMBULATORY_CARE_PROVIDER_SITE_OTHER): Payer: PPO

## 2023-05-02 VITALS — Ht 67.0 in | Wt 211.0 lb

## 2023-05-02 DIAGNOSIS — Z0000 Encounter for general adult medical examination without abnormal findings: Secondary | ICD-10-CM

## 2023-05-02 DIAGNOSIS — Z1211 Encounter for screening for malignant neoplasm of colon: Secondary | ICD-10-CM | POA: Diagnosis not present

## 2023-05-02 NOTE — Progress Notes (Signed)
Subjective:   Alyssa Stone is a 66 y.o. female who presents for Medicare Annual (Subsequent) preventive examination.  Visit Complete: Virtual  I connected with  Alyssa Stone on 05/02/23 by a audio enabled telemedicine application and verified that I am speaking with the correct person using two identifiers.  Patient Location: Home  Provider Location: Home Office  I discussed the limitations of evaluation and management by telemedicine. The patient expressed understanding and agreed to proceed.  Patient Medicare AWV questionnaire was completed by the patient on 05/01/23; I have confirmed that all information answered by patient is correct and no changes since this date.  Vital Signs: Because this visit was a virtual/telehealth visit, some criteria may be missing or patient reported. Any vitals not documented were not able to be obtained and vitals that have been documented are patient reported.   Review of Systems      Cardiac Risk Factors include: advanced age (>24men, >70 women);dyslipidemia;diabetes mellitus;sedentary lifestyle     Objective:    Today's Vitals   05/02/23 0954  Weight: 211 lb (95.7 kg)  Height: 5\' 7"  (1.702 m)   Body mass index is 33.05 kg/m.     05/02/2023   10:01 AM 02/07/2022    1:19 PM 10/14/2020   10:44 AM  Advanced Directives  Does Patient Have a Medical Advance Directive? No No No  Would patient like information on creating a medical advance directive? No - Patient declined No - Patient declined No - Patient declined    Current Medications (verified) Outpatient Encounter Medications as of 05/02/2023  Medication Sig   Ascorbic Acid (VITAMIN C) 1000 MG tablet Take 1,000 mg by mouth daily.   atorvastatin (LIPITOR) 40 MG tablet Take 1 tablet (40 mg total) by mouth every evening for cholestrol.   clopidogrel (PLAVIX) 75 MG tablet Take 1 tablet (75 mg total) by mouth daily.   Cyanocobalamin (VITAMIN B-12) 5000 MCG SUBL Place 5,000 mcg under  the tongue daily.   glipiZIDE (GLUCOTROL) 10 MG tablet Take 1 tablet (10 mg total) by mouth 2 (two) times daily before a meal. for diabetes.   glucose blood (ONETOUCH VERIO) test strip use to test blood sugar up to 4 (four) times daily.   insulin glargine (LANTUS) 100 UNIT/ML Solostar Pen Inject 20 Units into the skin daily in the evening.   insulin glargine (LANTUS) 100 UNIT/ML Solostar Pen Inject 20 Units into the skin daily every evening for diabetes.   Insulin Pen Needle (B-D ULTRAFINE III SHORT PEN) 31G X 8 MM MISC USE NIGHTLY WITH INSULIN PEN FOR DIABETES.   Insulin Pen Needle (NOVOFINE PLUS PEN NEEDLE) 32G X 4 MM MISC Use once weekly as directed   Lancets (ONETOUCH DELICA PLUS LANCET30G) MISC USE TO TEST BLOOD SUGAR UP TO 4 TIMES A DAY   Lifitegrast (XIIDRA) 5 % SOLN Place 1 drop into both eyes 2 (two) times daily.   Magnesium 250 MG TABS    metFORMIN (GLUCOPHAGE-XR) 500 MG 24 hr tablet Take 1 tablet (500 mg total) by mouth 2 (two) times daily for diabetes.   metoprolol succinate (TOPROL-XL) 25 MG 24 hr tablet Take 1 tablet (25 mg total) by mouth daily for heart rate.   tobramycin (TOBREX) 0.3 % ophthalmic solution PLEASE SEE ATTACHED FOR DETAILED DIRECTIONS   tobramycin (TOBREX) 0.3 % ophthalmic solution Place 1 drop into both eyes 4 (four) times daily beginning 1 day before procedure, the day of procedure, and the day after procedure. Continue as directed.  zinc gluconate 50 MG tablet Take 50 mg by mouth daily.   influenza vaccine adjuvanted (FLUAD) 0.5 ML injection Inject into the muscle. (Patient not taking: Reported on 05/02/2023)   nitroGLYCERIN (NITROSTAT) 0.4 MG SL tablet Place 1 tablet (0.4 mg total) under the tongue every 5 (five) minutes as needed for chest pain. (Patient not taking: Reported on 05/02/2023)   RSV vaccine recomb adjuvanted (AREXVY) 120 MCG/0.5ML injection Inject into the muscle. (Patient not taking: Reported on 05/02/2023)   No facility-administered encounter  medications on file as of 05/02/2023.    Allergies (verified) Patient has no known allergies.   History: Past Medical History:  Diagnosis Date   Allergy    Anemia    CAD (coronary artery disease)    s/p DES to o-pLAD in 09/2020 // dLAD 75 (small, med Rx)   Cataract    Chicken pox    Depression    Diabetes mellitus    Fainting episodes    Gastroesophageal reflux disease    Hyperlipidemia    controlled om meds    Sinus tachycardia    Past Surgical History:  Procedure Laterality Date   ANAL FISSURE REPAIR     CATARACT EXTRACTION Bilateral 08/20/2022   CESAREAN SECTION     CORONARY IMAGING/OCT N/A 10/14/2020   Procedure: INTRAVASCULAR IMAGING/OCT;  Surgeon: Corky Crafts, MD;  Location: [facility] INVASIVE CV LAB;  Service: Cardiovascular;  Laterality: N/A;   CORONARY PRESSURE/FFR STUDY N/A 10/14/2020   Procedure: INTRAVASCULAR PRESSURE WIRE/FFR STUDY;  Surgeon: Corky Crafts, MD;  Location: [facility] INVASIVE CV LAB;  Service: Cardiovascular;  Laterality: N/A;   CORONARY STENT INTERVENTION N/A 10/14/2020   Procedure: CORONARY STENT INTERVENTION;  Surgeon: Corky Crafts, MD;  Location: [facility] INVASIVE CV LAB;  Service: Cardiovascular;  Laterality: N/A;   EYE SURGERY     LEFT HEART CATH AND CORONARY ANGIOGRAPHY N/A 10/14/2020   Procedure: LEFT HEART CATH AND CORONARY ANGIOGRAPHY;  Surgeon: Corky Crafts, MD;  Location: [facility] INVASIVE CV LAB;  Service: Cardiovascular;  Laterality: N/A;   TONSILLECTOMY Bilateral    TUBAL LIGATION     Family History  Problem Relation Age of Onset   Cancer Mother    Diabetes Mother    Miscarriages / [place] Mother    Hypertension Father    Heart attack Father    Cancer Sister    Diabetes Sister    Diabetes Brother    Heart attack Brother    Kidney disease Brother    Hyperlipidemia Brother    Colon cancer Neg Hx    Colon polyps Neg Hx    Esophageal cancer Neg Hx    Rectal cancer Neg Hx    Stomach cancer Neg Hx    Social  History   Socioeconomic History   Marital status: Divorced    Spouse name: Not on file   Number of children: 3   Years of education: Not on file   Highest education level: 12th grade  Occupational History   Not on file  Tobacco Use   Smoking status: Former    Current packs/day: 0.00    Types: Cigarettes    Start date: 02/17/1973    Quit date: 02/18/1975    Years since quitting: 48.2   Smokeless tobacco: Never  Substance and Sexual Activity   Alcohol use: Not Currently   Drug use: Never   Sexual activity: Not Currently    Birth control/protection: None  Other Topics Concern   Not on file  Social History Narrative  Not on file   Social Determinants of Health   Financial Resource Strain: Low Risk  (05/01/2023)   Overall Financial Resource Strain (CARDIA)    Difficulty of Paying Living Expenses: Not hard at all  Food Insecurity: No Food Insecurity (05/01/2023)   Hunger Vital Sign    Worried About Running Out of Food in the Last Year: Never true    Ran Out of Food in the Last Year: Never true  Transportation Needs: No Transportation Needs (05/01/2023)   PRAPARE - Administrator, Civil Service (Medical): No    Lack of Transportation (Non-Medical): No  Physical Activity: Insufficiently Active (05/01/2023)   Exercise Vital Sign    Days of Exercise per Week: 2 days    Minutes of Exercise per Session: 20 min  Stress: No Stress Concern Present (05/01/2023)   [employer] of Occupational Health - Occupational Stress Questionnaire    Feeling of Stress : Not at all  Social Connections: Socially Isolated (05/01/2023)   Social Connection and Isolation Panel [NHANES]    Frequency of Communication with Friends and Family: Once a week    Frequency of Social Gatherings with Friends and Family: Twice a week    Attends Religious Services: Never    Database administrator or Organizations: No    Attends Engineer, structural: Never    Marital Status: Divorced     Tobacco Counseling Counseling given: Not Answered   Clinical Intake:  Pre-visit preparation completed: Yes  Pain : No/denies pain     BMI - recorded: 33.05 Nutritional Status: BMI > 30  Obese Nutritional Risks: None Diabetes: Yes CBG done?: Yes (130 today per pt) CBG resulted in Enter/ Edit results?: No Did pt. bring in CBG monitor from home?: No  How often do you need to have someone help you when you read instructions, pamphlets, or other written materials from your doctor or pharmacy?: 1 - Never  Interpreter Needed?: No  Information entered by :: C.Dieter Hane LPN   Activities of Daily Living    05/01/2023    8:57 AM  In your present state of health, do you have any difficulty performing the following activities:  Hearing? 0  Vision? 0  Difficulty concentrating or making decisions? 0  Walking or climbing stairs? 0  Dressing or bathing? 0  Doing errands, shopping? 0  Preparing Food and eating ? N  Using the Toilet? N  In the past six months, have you accidently leaked urine? N  Do you have problems with loss of bowel control? N  Managing your Medications? N  Managing your Finances? N  Housekeeping or managing your Housekeeping? N    Patient Care Team: Doreene Nest, NP as PCP - General (Internal Medicine) Nahser, Deloris Ping, MD as PCP - Cardiology (Cardiology) Blair Promise, OD (Optometry)  Indicate any recent Medical Services you may have received from other than [facility] providers in the past year (date may be approximate).     Assessment:   This is a routine wellness examination for Alyssa Stone.  Hearing/Vision screen Hearing Screening - Comments:: Denies hearing difficulties   Vision Screening - Comments:: Glasses for reading - [facility] - UTD on exams   Goals Addressed             This Visit's Progress    Patient Stated       Stay active       Depression Screen    05/02/2023    9:56 AM 02/28/2023  9:38 AM 02/22/2022    8:34 AM  02/07/2022    1:16 PM 02/09/2021    9:05 AM 05/12/2020   10:59 AM  PHQ 2/9 Scores  PHQ - 2 Score 0 0 0 0 0 0  PHQ- 9 Score  0 0  0 0    Fall Risk    05/01/2023    8:57 AM 02/28/2023    9:39 AM 08/30/2022    8:29 AM 02/22/2022    8:34 AM 02/07/2022    1:18 PM  Fall Risk   Falls in the past year? 0 0 0 0 0  Number falls in past yr: 0 0 0 0 0  Injury with Fall? 0 0 0  0  Risk for fall due to : No Fall Risks No Fall Risks No Fall Risks History of fall(s) No Fall Risks  Follow up Falls prevention discussed;Falls evaluation completed Falls evaluation completed Falls evaluation completed      MEDICARE RISK AT HOME: Medicare Risk at Home Any stairs in or around the home?: No If so, are there any without handrails?: No Home free of loose throw rugs in walkways, pet beds, electrical cords, etc?: Yes Adequate lighting in your home to reduce risk of falls?: Yes Life alert?: No Use of a cane, walker or w/c?: No Grab bars in the bathroom?: No Shower chair or bench in shower?: No Elevated toilet seat or a handicapped toilet?: No  TIMED UP AND GO:  Was the test performed?  No    Cognitive Function:        05/02/2023   10:03 AM 02/07/2022    1:19 PM  6CIT Screen  What Year? 0 points 0 points  What month? 0 points 0 points  What time? 0 points 0 points  Count back from 20 0 points 0 points  Months in reverse 0 points 0 points  Repeat phrase 0 points 0 points  Total Score 0 points 0 points    Immunizations Immunization History  Administered Date(s) Administered   Fluad Quad(high Dose 65+) 05/30/2022   Influenza,inj,Quad PF,6+ Mos 05/21/2019   PFIZER(Purple Top)SARS-COV-2 Vaccination 12/12/2019, 01/02/2020   Pneumococcal Conjugate-13 10/07/2019   Pneumococcal Polysaccharide-23 02/09/2021   Respiratory Syncytial Virus Vaccine,Recomb Aduvanted(Arexvy) 04/27/2022   Zoster Recombinant(Shingrix) 09/22/2019, 01/27/2020    TDAP status: Up to date  Flu Vaccine status: Due, Education  has been provided regarding the importance of this vaccine. Advised may receive this vaccine at local pharmacy or Health Dept. Aware to provide a copy of the vaccination record if obtained from local pharmacy or Health Dept. Verbalized acceptance and understanding.  Pneumococcal vaccine status: Up to date  Covid-19 vaccine status: Information provided on how to obtain vaccines.   Qualifies for Shingles Vaccine? Yes   Zostavax completed      Shingrix Completed?: Yes  Screening Tests Health Maintenance  Topic Date Due   Colonoscopy  11/26/2022   INFLUENZA VACCINE  03/21/2023   OPHTHALMOLOGY EXAM  03/31/2023   FOOT EXAM  08/31/2023   HEMOGLOBIN A1C  08/31/2023   Diabetic kidney evaluation - eGFR measurement  02/28/2024   Diabetic kidney evaluation - Urine ACR  02/28/2024   Medicare Annual Wellness (AWV)  05/01/2024   MAMMOGRAM  01/02/2025   Pneumonia Vaccine 80+ Years old  Completed   DEXA SCAN  Completed   Hepatitis C Screening  Completed   Zoster Vaccines- Shingrix  Completed   HPV VACCINES  Aged Out   DTaP/Tdap/Td  Discontinued   COVID-19 Vaccine  Discontinued    Health Maintenance  Health Maintenance Due  Topic Date Due   Colonoscopy  11/26/2022   INFLUENZA VACCINE  03/21/2023   OPHTHALMOLOGY EXAM  03/31/2023    Colorectal cancer screening: Referral to GI placed 05/02/23. Pt aware the office will call re: appt.  Mammogram status: Completed 01/03/23. Repeat every year  Bone Density status: Completed 08/02/21. Results reflect: Bone density results: NORMAL. Repeat every 5 years.  Lung Cancer Screening: (Low Dose CT Chest recommended if Age 25-80 years, 20 pack-year currently smoking OR have quit w/in 15years.) does not qualify.   Lung Cancer Screening Referral:    Additional Screening:  Hepatitis C Screening: does qualify; Completed DUE  Vision Screening: Recommended annual ophthalmology exams for early detection of glaucoma and other disorders of the eye. Is the  patient up to date with their annual eye exam?  Yes  Who is the provider or what is the name of the office in which the patient attends annual eye exams? [facility] If pt is not established with a provider, would they like to be referred to a provider to establish care? Yes .   Dental Screening: Recommended annual dental exams for proper oral hygiene  Diabetic Foot Exam: Diabetic Foot Exam: Completed 08/30/22  Community Resource Referral / Chronic Care Management: CRR required this visit?  No   CCM required this visit?  No     Plan:     I have personally reviewed and noted the following in the patient's chart:   Medical and social history Use of alcohol, tobacco or illicit drugs  Current medications and supplements including opioid prescriptions. Patient is not currently taking opioid prescriptions. Functional ability and status Nutritional status Physical activity Advanced directives List of other physicians Hospitalizations, surgeries, and ER visits in previous 12 months Vitals Screenings to include cognitive, depression, and falls Referrals and appointments  In addition, I have reviewed and discussed with patient certain preventive protocols, quality metrics, and best practice recommendations. A written personalized care plan for preventive services as well as general preventive health recommendations were provided to patient.     Maryan Puls, LPN   [id number]   After Visit Summary: (MyChart) Due to this being a telephonic visit, the after visit summary with patients personalized plan was offered to patient via MyChart   Nurse Notes: Diabetic eye exams results requested.

## 2023-05-02 NOTE — Patient Instructions (Addendum)
Alyssa Stone , Thank you for taking time to come for your Medicare Wellness Visit. I appreciate your ongoing commitment to your health goals. Please review the following plan we discussed and let me know if I can assist you in the future.   Referrals/Orders/Follow-Ups/Clinician Recommendations: Aim for 30 minutes of exercise or brisk walking, 6-8 glasses of water, and 5 servings of fruits and vegetables each day.   [facility] [address] [place] [address] [place],  [place]  [address] Main: [phone]   This is a list of the screening recommended for you and due dates:  Health Maintenance  Topic Date Due   Colon Cancer Screening  11/26/2022   Medicare Annual Wellness Visit  02/08/2023   Flu Shot  03/21/2023   Eye exam for diabetics  03/31/2023   Complete foot exam   08/31/2023   Hemoglobin A1C  08/31/2023   Yearly kidney function blood test for diabetes  02/28/2024   Yearly kidney health urinalysis for diabetes  02/28/2024   Mammogram  01/02/2025   Pneumonia Vaccine  Completed   DEXA scan (bone density measurement)  Completed   Hepatitis C Screening  Completed   Zoster (Shingles) Vaccine  Completed   HPV Vaccine  Aged Out   DTaP/Tdap/Td vaccine  Discontinued   COVID-19 Vaccine  Discontinued    Advanced directives: (Declined) Advance directive discussed with you today. Even though you declined this today, please call our office should you change your mind, and we can give you the proper paperwork for you to fill out.  Next Medicare Annual Wellness Visit scheduled for next year: Yes

## 2023-05-20 ENCOUNTER — Encounter: Payer: Self-pay | Admitting: Gastroenterology

## 2023-05-20 ENCOUNTER — Other Ambulatory Visit: Payer: Self-pay | Admitting: Primary Care

## 2023-05-20 DIAGNOSIS — E1140 Type 2 diabetes mellitus with diabetic neuropathy, unspecified: Secondary | ICD-10-CM

## 2023-05-23 ENCOUNTER — Other Ambulatory Visit (INDEPENDENT_AMBULATORY_CARE_PROVIDER_SITE_OTHER): Payer: PPO

## 2023-05-23 DIAGNOSIS — E1140 Type 2 diabetes mellitus with diabetic neuropathy, unspecified: Secondary | ICD-10-CM | POA: Diagnosis not present

## 2023-05-23 LAB — POCT GLYCOSYLATED HEMOGLOBIN (HGB A1C): Hemoglobin A1C: 8.5 % — AB (ref 4.0–5.6)

## 2023-05-24 ENCOUNTER — Other Ambulatory Visit: Payer: Self-pay

## 2023-05-24 MED ORDER — SITAGLIPTIN PHOSPHATE 50 MG PO TABS
50.0000 mg | ORAL_TABLET | Freq: Every day | ORAL | 0 refills | Status: DC
Start: 2023-05-24 — End: 2023-08-12
  Filled 2023-05-24: qty 90, 90d supply, fill #0

## 2023-05-28 ENCOUNTER — Other Ambulatory Visit: Payer: Self-pay | Admitting: Primary Care

## 2023-05-28 DIAGNOSIS — R000 Tachycardia, unspecified: Secondary | ICD-10-CM

## 2023-05-28 DIAGNOSIS — E785 Hyperlipidemia, unspecified: Secondary | ICD-10-CM

## 2023-05-28 MED ORDER — ATORVASTATIN CALCIUM 40 MG PO TABS
40.0000 mg | ORAL_TABLET | Freq: Every evening | ORAL | 2 refills | Status: DC
  Filled 2023-05-28: qty 90, 90d supply, fill #0
  Filled 2023-08-27: qty 90, 90d supply, fill #1
  Filled 2023-11-19: qty 90, 90d supply, fill #2

## 2023-05-28 MED ORDER — METOPROLOL SUCCINATE ER 25 MG PO TB24
25.0000 mg | ORAL_TABLET | Freq: Every day | ORAL | 2 refills | Status: DC
Start: 2023-05-28 — End: 2023-12-31
  Filled 2023-05-28: qty 90, 90d supply, fill #0
  Filled 2023-08-27: qty 90, 90d supply, fill #1
  Filled 2023-11-19: qty 90, 90d supply, fill #2

## 2023-05-29 ENCOUNTER — Other Ambulatory Visit: Payer: Self-pay

## 2023-06-06 ENCOUNTER — Other Ambulatory Visit: Payer: Self-pay

## 2023-06-06 MED ORDER — FLUOCINOLONE ACETONIDE SCALP 0.01 % EX OIL
TOPICAL_OIL | CUTANEOUS | 5 refills | Status: DC
  Filled 2023-06-06: qty 118.28, 30d supply, fill #0

## 2023-06-06 MED ORDER — CLOBETASOL PROPIONATE 0.05 % EX SOLN
Freq: Every day | CUTANEOUS | 3 refills | Status: DC
  Filled 2023-06-06: qty 50, 30d supply, fill #0

## 2023-07-03 ENCOUNTER — Other Ambulatory Visit: Payer: Self-pay | Admitting: Primary Care

## 2023-07-03 ENCOUNTER — Other Ambulatory Visit: Payer: Self-pay

## 2023-07-03 DIAGNOSIS — E1140 Type 2 diabetes mellitus with diabetic neuropathy, unspecified: Secondary | ICD-10-CM

## 2023-07-03 MED ORDER — GLIPIZIDE 10 MG PO TABS
10.0000 mg | ORAL_TABLET | Freq: Two times a day (BID) | ORAL | 0 refills | Status: DC
  Filled 2023-07-03: qty 180, 90d supply, fill #0

## 2023-07-09 ENCOUNTER — Other Ambulatory Visit: Payer: Self-pay

## 2023-07-10 ENCOUNTER — Other Ambulatory Visit: Payer: Self-pay

## 2023-07-10 ENCOUNTER — Other Ambulatory Visit: Payer: Self-pay | Admitting: Primary Care

## 2023-07-10 DIAGNOSIS — E1140 Type 2 diabetes mellitus with diabetic neuropathy, unspecified: Secondary | ICD-10-CM

## 2023-07-10 MED FILL — "Insulin Pen Needle 32 G X 4 MM (1/6"" or 5/32"")": 100 days supply | Qty: 100 | Fill #0 | Status: AC

## 2023-07-11 ENCOUNTER — Other Ambulatory Visit: Payer: Self-pay

## 2023-07-11 MED ORDER — FLUAD 0.5 ML IM SUSY
PREFILLED_SYRINGE | INTRAMUSCULAR | 0 refills | Status: DC
  Filled 2023-07-11: qty 0.5, 1d supply, fill #0

## 2023-07-25 ENCOUNTER — Other Ambulatory Visit: Payer: Self-pay

## 2023-08-01 ENCOUNTER — Other Ambulatory Visit: Payer: Self-pay

## 2023-08-12 ENCOUNTER — Other Ambulatory Visit: Payer: Self-pay | Admitting: Primary Care

## 2023-08-12 ENCOUNTER — Other Ambulatory Visit: Payer: Self-pay

## 2023-08-12 DIAGNOSIS — E1140 Type 2 diabetes mellitus with diabetic neuropathy, unspecified: Secondary | ICD-10-CM

## 2023-08-12 MED FILL — Sitagliptin Phosphate Tab 50 MG (Base Equiv): ORAL | 90 days supply | Qty: 90 | Fill #0 | Status: AC

## 2023-08-13 ENCOUNTER — Other Ambulatory Visit: Payer: Self-pay

## 2023-08-19 ENCOUNTER — Other Ambulatory Visit: Payer: Self-pay

## 2023-08-23 ENCOUNTER — Encounter: Payer: Self-pay | Admitting: Gastroenterology

## 2023-08-23 ENCOUNTER — Other Ambulatory Visit (INDEPENDENT_AMBULATORY_CARE_PROVIDER_SITE_OTHER): Payer: PPO

## 2023-08-23 ENCOUNTER — Ambulatory Visit: Payer: PPO | Admitting: Gastroenterology

## 2023-08-23 VITALS — BP 130/66 | HR 86 | Ht 67.0 in | Wt 221.6 lb

## 2023-08-23 DIAGNOSIS — R12 Heartburn: Secondary | ICD-10-CM | POA: Diagnosis not present

## 2023-08-23 DIAGNOSIS — K5900 Constipation, unspecified: Secondary | ICD-10-CM

## 2023-08-23 DIAGNOSIS — Z860101 Personal history of adenomatous and serrated colon polyps: Secondary | ICD-10-CM | POA: Diagnosis not present

## 2023-08-23 DIAGNOSIS — R1310 Dysphagia, unspecified: Secondary | ICD-10-CM

## 2023-08-23 LAB — TSH: TSH: 3.67 u[IU]/mL (ref 0.35–5.50)

## 2023-08-23 NOTE — Progress Notes (Signed)
 GASTROENTEROLOGY OUTPATIENT CLINIC VISIT   Primary Care Provider Gretta Comer POUR, NP [address] [place] [address] [phone]  Referring Provider Gretta Comer POUR, NP [address] [place],  [place] [address] [phone]  Patient Profile: Alyssa Stone is a 68 y.o. female with a pmh significant for CAD (status post PCI on Plavix ), hypertension, diabetes, hyperlipidemia, MDD, status postcholecystectomy, status post appendectomy, GERD, colon polyps (TA's).  The patient presents to the [facility] for an evaluation and management of problem(s) noted below:  Problem List 1. Hx of adenomatous colonic polyps   2. Constipation, unspecified constipation type   3. Pyrosis   4. Dysphagia, unspecified type    Discussed the use of AI scribe software for clinical note transcription with the patient, who gave verbal consent to proceed.  History of Present Illness The patient presents for follow-up.  She has a history of adenomatous colon polyps found in 2021.  She is more recently in the last few years been initiated on Plavix  (which she wasn't on at time of last colonoscopy).  She does bruise easier but no overt bleeding.  She deals with chronic constipation attributed to her medication regimen by the patient.  Despite attempts to manage this with over-the-counter remedies, increased water intake, and dietary fiber, the constipation persists. The patient describes their bowel movements as infrequent and hard, missing days at a time.  As she progresses without a bowel movement, this causes her discomfort but no bloating.  The patient will infrequently experience a choking sensation.  She will drink more water, and this will help mostly.  She has had to regurgitate though at times.  She has infrequent heartburn/pyrosis, but when this does occur due to dietary indiscretions of pizza and onions, she will manage this with PRN TUMS.  No blood has been noted in her  stools.   GI Review of Systems Positive as above Negative for odynophagia, nausea, vomiting, melena, hematochezia  Review of Systems General: Denies fevers/chills/weight loss unintentionally Cardiovascular: Denies chest pain Pulmonary: Denies shortness of breath Gastroenterological: See HPI Genitourinary: Denies darkened urine Hematological: Denies easy bruising/bleeding Endocrine: Denies temperature intolerance Dermatological: Denies jaundice Psychological: Mood is stable   Medications Current Outpatient Medications  Medication Sig Dispense Refill   Ascorbic Acid  (VITAMIN C ) 1000 MG tablet Take 1,000 mg by mouth daily.     atorvastatin  (LIPITOR) 40 MG tablet Take 1 tablet (40 mg total) by mouth every evening for cholestrol. 90 tablet 2   clobetasol  (TEMOVATE ) 0.05 % external solution Apply 1 application topically to dry scalp once daily as needed for flares. 50 mL 3   clopidogrel  (PLAVIX ) 75 MG tablet Take 1 tablet (75 mg total) by mouth daily. 90 tablet 3   Cyanocobalamin  (VITAMIN B-12) 5000 MCG SUBL Place 5,000 mcg under the tongue daily.     glipiZIDE  (GLUCOTROL ) 10 MG tablet Take 1 tablet (10 mg total) by mouth 2 (two) times daily before a meal for diabetes. 180 tablet 0   glucose blood (ONETOUCH VERIO) test strip use to test blood sugar up to 4 (four) times daily. 300 strip 3   influenza vaccine adjuvanted (FLUAD ) 0.5 ML injection Inject into the muscle. 0.5 mL 0   influenza vaccine adjuvanted (FLUAD ) 0.5 ML injection Inject into the muscle. 0.5 mL 0   insulin  glargine (LANTUS ) 100 UNIT/ML Solostar Pen Inject 20 Units into the skin daily in the evening. 30 mL 3   insulin  glargine (LANTUS ) 100 UNIT/ML Solostar Pen  Inject 20 Units into the skin daily every evening for diabetes. 30 mL 3   Insulin  Pen Needle (TECHLITE PLUS PEN NEEDLES) 32G X 4 MM MISC Use once daily with insulin . 100 each 3   Lancets (ONETOUCH DELICA PLUS LANCET30G) MISC USE TO TEST BLOOD SUGAR UP TO 4 TIMES A DAY  300 each 5   Lifitegrast  (XIIDRA ) 5 % SOLN Place 1 drop into both eyes 2 (two) times daily. 180 each 4   Magnesium  250 MG TABS Take 250 mg by mouth daily.     metFORMIN  (GLUCOPHAGE -XR) 500 MG 24 hr tablet Take 1 tablet (500 mg total) by mouth 2 (two) times daily for diabetes. 180 tablet 1   metoprolol  succinate (TOPROL -XL) 25 MG 24 hr tablet Take 1 tablet (25 mg total) by mouth daily for heart rate. 90 tablet 2   nitroGLYCERIN  (NITROSTAT ) 0.4 MG SL tablet Place 1 tablet (0.4 mg total) under the tongue every 5 (five) minutes as needed for chest pain. 25 tablet 3   RSV vaccine recomb adjuvanted (AREXVY ) 120 MCG/0.5ML injection Inject into the muscle. 1 each 0   sitaGLIPtin  (JANUVIA ) 50 MG tablet Take 1 tablet (50 mg total) by mouth daily for diabetes. 90 tablet 0   tobramycin  (TOBREX ) 0.3 % ophthalmic solution Place 1 drop into both eyes 4 (four) times daily beginning 1 day before procedure, the day of procedure, and the day after procedure. Continue as directed. 5 mL 5   zinc  gluconate 50 MG tablet Take 50 mg by mouth daily.     Fluocinolone  Acetonide Scalp (DERMA-SMOOTHE /FS SCALP) 0.01 % OIL Apply 1 application topically to damp scalp, with a shower cap overnight, 1 to 3 times weekly for thick plaques, and then once a week as maintenance. (Patient not taking: Reported on 08/23/2023) 118.28 mL 5   tobramycin  (TOBREX ) 0.3 % ophthalmic solution PLEASE SEE ATTACHED FOR DETAILED DIRECTIONS (Patient not taking: Reported on 08/23/2023)     No current facility-administered medications for this visit.    Allergies No Known Allergies  Histories Past Medical History:  Diagnosis Date   Allergy    Anemia    CAD (coronary artery disease)    s/p DES to o-pLAD in 09/2020 // dLAD 75 (small, med Rx)   Cataract    Chicken pox    Depression    Diabetes mellitus    Fainting episodes    Gastroesophageal reflux disease    Hyperlipidemia    controlled om meds    Sinus tachycardia    Past Surgical History:   Procedure Laterality Date   ANAL FISSURE REPAIR     CATARACT EXTRACTION Bilateral 08/20/2022   CESAREAN SECTION     CORONARY IMAGING/OCT N/A 10/14/2020   Procedure: INTRAVASCULAR IMAGING/OCT;  Surgeon: Dann Candyce RAMAN, MD;  Location: [facility] INVASIVE CV LAB;  Service: Cardiovascular;  Laterality: N/A;   CORONARY PRESSURE/FFR STUDY N/A 10/14/2020   Procedure: INTRAVASCULAR PRESSURE WIRE/FFR STUDY;  Surgeon: Dann Candyce RAMAN, MD;  Location: [facility] INVASIVE CV LAB;  Service: Cardiovascular;  Laterality: N/A;   CORONARY STENT INTERVENTION N/A 10/14/2020   Procedure: CORONARY STENT INTERVENTION;  Surgeon: Dann Candyce RAMAN, MD;  Location: [facility] INVASIVE CV LAB;  Service: Cardiovascular;  Laterality: N/A;   EYE SURGERY     LEFT HEART CATH AND CORONARY ANGIOGRAPHY N/A 10/14/2020   Procedure: LEFT HEART CATH AND CORONARY ANGIOGRAPHY;  Surgeon: Dann Candyce RAMAN, MD;  Location: [facility] INVASIVE CV LAB;  Service: Cardiovascular;  Laterality: N/A;   TONSILLECTOMY Bilateral  TUBAL LIGATION     Social History   Socioeconomic History   Marital status: Divorced    Spouse name: Not on file   Number of children: 3   Years of education: Not on file   Highest education level: 12th grade  Occupational History   Not on file  Tobacco Use   Smoking status: Former    Current packs/day: 0.00    Types: Cigarettes    Start date: 02/17/1973    Quit date: 02/18/1975    Years since quitting: 48.5   Smokeless tobacco: Never  Vaping Use   Vaping status: Never Used  Substance and Sexual Activity   Alcohol use: Not Currently   Drug use: Never   Sexual activity: Not Currently    Birth control/protection: None  Other Topics Concern   Not on file  Social History Narrative   Not on file   Social Drivers of Health   Financial Resource Strain: Low Risk  (05/01/2023)   Overall Financial Resource Strain (CARDIA)    Difficulty of Paying Living Expenses: Not hard at all  Food Insecurity: No Food Insecurity  (05/01/2023)   Hunger Vital Sign    Worried About Running Out of Food in the Last Year: Never true    Ran Out of Food in the Last Year: Never true  Transportation Needs: No Transportation Needs (05/01/2023)   PRAPARE - Administrator, Civil Service (Medical): No    Lack of Transportation (Non-Medical): No  Physical Activity: Insufficiently Active (05/01/2023)   Exercise Vital Sign    Days of Exercise per Week: 2 days    Minutes of Exercise per Session: 20 min  Stress: No Stress Concern Present (05/01/2023)   [employer] of Occupational Health - Occupational Stress Questionnaire    Feeling of Stress : Not at all  Social Connections: Socially Isolated (05/01/2023)   Social Connection and Isolation Panel [NHANES]    Frequency of Communication with Friends and Family: Once a week    Frequency of Social Gatherings with Friends and Family: Twice a week    Attends Religious Services: Never    Database Administrator or Organizations: No    Attends Banker Meetings: Never    Marital Status: Divorced  Catering Manager Violence: Not At Risk (05/02/2023)   Humiliation, Afraid, Rape, and Kick questionnaire    Fear of Current or Ex-Partner: No    Emotionally Abused: No    Physically Abused: No    Sexually Abused: No   Family History  Problem Relation Age of Onset   Cancer Mother    Diabetes Mother    Miscarriages / Stillbirths Mother    Hypertension Father    Heart attack Father    Cancer Sister    Diabetes Sister    Diabetes Brother    Heart attack Brother    Kidney disease Brother    Hyperlipidemia Brother    Colon cancer Neg Hx    Colon polyps Neg Hx    Esophageal cancer Neg Hx    Rectal cancer Neg Hx    Stomach cancer Neg Hx    Inflammatory bowel disease Neg Hx    Liver disease Neg Hx    Pancreatic cancer Neg Hx    I have reviewed her medical, social, and family history in detail and updated the electronic medical record as necessary.    PHYSICAL  EXAMINATION  BP 130/66   Pulse 86   Ht 5' 7 (1.702 m)  Wt 221 lb 9.6 oz (100.5 kg)   SpO2 99%   BMI 34.71 kg/m  Wt Readings from Last 3 Encounters:  08/23/23 221 lb 9.6 oz (100.5 kg)  05/02/23 211 lb (95.7 kg)  02/28/23 211 lb (95.7 kg)  GEN: NAD, appears stated age, doesn't appear chronically ill PSYCH: Cooperative, without pressured speech EYE: Conjunctivae pink, sclerae anicteric ENT: MMM CV: Nontachycardic RESP: No audible wheezing GI: NABS, soft, protuberant abdomen, rounded, nontender, without rebound  MSK/EXT: No significant lower extremity edema SKIN: No jaundice NEURO:  Alert & Oriented x 3, no focal deficits   REVIEW OF DATA  I reviewed the following data at the time of this encounter:  GI Procedures and Studies  2021 colonoscopy - Hemorrhoids found on digital rectal exam as well as external skin tags. - Stool in the entire examined colon. Lavaged copiously in order to obtain adequate preparation. - Four 3 to 10 mm polyps in the sigmoid colon, in the descending colon, in the transverse colon and in the ascending colon, removed with a cold snare. Resected and retrieved. - Diverticulosis in the recto-sigmoid colon, in the sigmoid colon and in the descending colon. - Normal mucosa in the entire examined colon otherwise. - Non-bleeding non-thrombosed external and internal hemorrhoids.  Pathology Diagnosis Surgical [P], colon, sigmoid, descending, transverse, and ascending, polyp (4) - TUBULAR ADENOMA (X4 FRAGMENTS). - NO HIGH GRADE DYSPLASIA OR MALIGNANCY.  Laboratory Studies  Reviewed those in epic  Imaging Studies  No relevant studies to review   ASSESSMENT  Ms. Ishee is a 68 y.o. female with a pmh significant for CAD (status post PCI on Plavix ), hypertension, diabetes, hyperlipidemia, MDD, status postcholecystectomy, status post appendectomy, GERD, colon polyps (TA's).  The patient is seen today for evaluation and management of:  1. Hx of adenomatous  colonic polyps   2. Constipation, unspecified constipation type   3. Pyrosis   4. Dysphagia, unspecified type    The patient is clinically and hemodynamically stable at this time.  She still remains a candidate for colon polyp surveillance based on her overall health and medical issues.  We will just have to get approval for Plavix  hold she is greater than 1 year since her PCI intervention), thus we will ask cardiology for their thoughts on this.  I do think we need to optimize her bowel habits however prior to colonoscopy we can work on that with initiation of medication therapy.  Will trial Linzess  samples and if she does well then we can send in a prescription or can trial different medications in the next few weeks.  She is going to initiate FiberCon as well and also get a training and development officer.  Hopefully this will help with her bowel habits.  She has intermittent pyrosis symptoms but is not needing a PPI.  However with her having infrequent dysphagia symptoms, and endoscopy for diagnostic purposes and screening for Barrett's esophagus is reasonable.  Will plan an EGD/colonoscopy together later this year.  The risks and benefits of endoscopic evaluation were discussed with the patient; these include but are not limited to the risk of perforation, infection, bleeding, missed lesions, lack of diagnosis, severe illness requiring hospitalization, as well as anesthesia and sedation related illnesses.  The patient and/or family is agreeable to proceed.  All patient questions were answered to the best of my ability, and the patient agrees to the aforementioned plan of action with follow-up as indicated.   PLAN  Laboratories as outlined below for constipation evaluation Linzess  samples 145  mcg - Patient will let us  know how she is doing in the next couple of weeks and if improving symptoms then we will send in a prescription or just Linzess  dosing samples or start her on a different medication Plan for EGD with  dilation and biopsies later this year Plan for colonoscopy for colon polyp surveillance this year - As noted above will optimize bowel habits versus considering a 2-day preparation Follow-up in the next 3 months to get her scheduled for procedures Will need to obtain approval for Plavix  hold for her procedures this year   Orders Placed This Encounter  Procedures   TSH    New Prescriptions   No medications on file   Modified Medications   No medications on file    Planned Follow Up No follow-ups on file.   Total Time in Face-to-Face and in Coordination of Care for patient including independent/personal interpretation/review of prior testing, medical history, examination, medication adjustment, communicating results with the patient directly, and documentation within the EHR is 45 minutes.   Aloha Finner, MD [facility] Office # [phone]

## 2023-08-23 NOTE — Patient Instructions (Addendum)
 We have given you samples of the following medication to take: Linzess  145 mcg- Take 1 capsule by mouth once daily.  Send my chart message in about 10 days and let us  know if this works for you. If it does, we will then send prescription to your pharmacy.   Toileting tips to help with your constipation - Drink at least 64-80 ounces of water/liquid per day. - Establish a time to try to move your bowels every day.  For many people, this is after a cup of coffee or after a meal such as breakfast. - Sit all of the way back on the toilet keeping your back fairly straight and while sitting up, try to rest the tops of your forearms on your upper thighs.   - Raising your feet with a step stool/squatty potty can be helpful to improve the angle that allows your stool to pass through the rectum. - Relax the rectum feeling it bulge toward the toilet water.  If you feel your rectum raising toward your body, you are contracting rather than relaxing. - Breathe in and slowly exhale. Belly breath by expanding your belly towards your belly button. Keep belly expanded as you gently direct pressure down and back to the anus.  A low pitched GRRR sound can assist with increasing intra-abdominal pressure.  - Repeat 3-4 times. If unsuccessful, contract the pelvic floor to restore normal tone and get off the toilet.  Avoid excessive straining. - To reduce excessive wiping by teaching your anus to normally contract, place hands on outer aspect of knees and resist knee movement outward.  Hold 5-10 second then place hands just inside of knees and resist inward movement of knees.  Hold 5 seconds.  Repeat a few times each way.   GERD Lifestyle modifications -Eat meals >3 hours before bedtime -Do not lay down or lie flat immediately after eating for at least 2-hours -Do not overeat (decrease portion size and/or eat more frequent smaller meals) -Eat slowly -Wear Loose-fitting clothes -Raise Head of Bed (Head & Chest > Feet  with bed blocks) -Avoid foods that trigger the symptoms (Onions, Chocolate, Caffeine, Spicy, and Fatty) -Work on Losing [employer] -Stop Tobacco Use -Avoid Alcohol -Increase Exercise Activity -Maintain Heartburn Diary/Log  Your provider has requested that you go to the basement level for lab work before leaving today. Press B on the elevator. The lab is located at the first door on the left as you exit the elevator.  Follow -up on : 10/25/23 at 10:00 am with Elida Shawl, CRNP  Due to recent changes in healthcare laws, you may see the results of your imaging and laboratory studies on MyChart before your provider has had a chance to review them.  We understand that in some cases there may be results that are confusing or concerning to you. Not all laboratory results come back in the same time frame and the provider may be waiting for multiple results in order to interpret others.  Please give us  48 hours in order for your provider to thoroughly review all the results before contacting the office for clarification of your results.   Thank you for choosing me and [facility].  Dr. Wilhelmenia

## 2023-08-27 ENCOUNTER — Encounter: Payer: Self-pay | Admitting: Gastroenterology

## 2023-08-27 ENCOUNTER — Other Ambulatory Visit: Payer: Self-pay

## 2023-08-27 DIAGNOSIS — K5900 Constipation, unspecified: Secondary | ICD-10-CM | POA: Insufficient documentation

## 2023-08-27 DIAGNOSIS — R12 Heartburn: Secondary | ICD-10-CM | POA: Insufficient documentation

## 2023-08-27 DIAGNOSIS — Z860101 Personal history of adenomatous and serrated colon polyps: Secondary | ICD-10-CM | POA: Insufficient documentation

## 2023-08-27 DIAGNOSIS — R1310 Dysphagia, unspecified: Secondary | ICD-10-CM | POA: Insufficient documentation

## 2023-09-03 ENCOUNTER — Encounter: Payer: Self-pay | Admitting: Gastroenterology

## 2023-09-03 ENCOUNTER — Other Ambulatory Visit: Payer: Self-pay

## 2023-09-03 MED ORDER — LINACLOTIDE 145 MCG PO CAPS
145.0000 ug | ORAL_CAPSULE | Freq: Every day | ORAL | 6 refills | Status: DC
  Filled 2023-09-03: qty 30, 30d supply, fill #0
  Filled 2023-09-30: qty 30, 30d supply, fill #1
  Filled 2023-10-22: qty 30, 30d supply, fill #2
  Filled 2023-11-19: qty 30, 30d supply, fill #3

## 2023-09-05 ENCOUNTER — Ambulatory Visit: Payer: PPO | Admitting: Primary Care

## 2023-09-05 ENCOUNTER — Other Ambulatory Visit: Payer: Self-pay

## 2023-09-05 ENCOUNTER — Encounter: Payer: Self-pay | Admitting: Primary Care

## 2023-09-05 VITALS — BP 148/82 | HR 80 | Temp 97.7°F | Ht 67.0 in | Wt 220.0 lb

## 2023-09-05 DIAGNOSIS — Z7984 Long term (current) use of oral hypoglycemic drugs: Secondary | ICD-10-CM

## 2023-09-05 DIAGNOSIS — E1140 Type 2 diabetes mellitus with diabetic neuropathy, unspecified: Secondary | ICD-10-CM

## 2023-09-05 LAB — POCT GLYCOSYLATED HEMOGLOBIN (HGB A1C): Hemoglobin A1C: 7.8 % — AB (ref 4.0–5.6)

## 2023-09-05 MED ORDER — SITAGLIPTIN PHOSPHATE 100 MG PO TABS
100.0000 mg | ORAL_TABLET | Freq: Every day | ORAL | 1 refills | Status: DC
  Filled 2023-09-05: qty 90, 90d supply, fill #0
  Filled 2024-01-20: qty 90, 90d supply, fill #1

## 2023-09-05 MED ORDER — INSULIN GLARGINE 100 UNIT/ML SOLOSTAR PEN: 15.0000 [IU] | PEN_INJECTOR | Freq: Every evening | SUBCUTANEOUS | Status: DC

## 2023-09-05 MED ORDER — FREESTYLE LIBRE 3 PLUS SENSOR MISC
1 refills | Status: DC
  Filled 2023-09-05: qty 6, 90d supply, fill #0

## 2023-09-05 NOTE — Patient Instructions (Signed)
We increased your sitagliptin (Januvia) medication for diabetes to 100 mg daily.  I sent a new prescription to your pharmacy.  Continue metformin and glipizide as prescribed.  Reduce your insulin to 15 units daily.  Apply the freestyle libre 3+ sensor every 15 days to check blood sugars continuously.  Schedule a lab only appointment in 3 months to repeat your A1c level.  Please schedule a physical to meet with me in 6 months.   It was a pleasure to see you today!

## 2023-09-05 NOTE — Progress Notes (Signed)
Subjective:    Patient ID: Alyssa Stone, female    DOB: [birth year], 68 y.o.   MRN: [id number]  HPI  Alyssa Stone is a very pleasant 68 y.o. female with a history of type 2 diabetes, CAD, hyperlipidemia who presents today for follow-up of diabetes.  Current medications include: Glipizide 10 mg twice daily, metformin ER 500 mg twice daily, Lantus 20 units daily, Januvia 50 mg daily.  Intolerant to [employer] and Greggory Keen was cost prohibitive  She is checking her blood glucose 1 times daily and is getting readings of:  AM fasting: low to mid 100s  Highest reading: 160 Lowest reading: 85  Last A1C: 8.5 in October 2024, 7.8 today Last Eye Exam: Up-to-date Last Foot Exam: Due Pneumonia Vaccination: 2022 Urine Microalbumin: Up-to-date Statin: Atorvastatin  Dietary changes since last visit: She has been cutting back on portion sizes.    Exercise: None   BP Readings from Last 3 Encounters:  09/05/23 (!) 148/82  08/23/23 130/66  02/28/23 118/60      Review of Systems  Respiratory:  Negative for shortness of breath.   Cardiovascular:  Negative for chest pain.  Gastrointestinal:  Negative for abdominal pain and nausea.  Neurological:  Positive for numbness. Negative for dizziness.         Past Medical History:  Diagnosis Date   Allergy    Anemia    CAD (coronary artery disease)    s/p DES to o-pLAD in 09/2020 // dLAD 75 (small, med Rx)   Cataract    Chicken pox    Depression    Diabetes mellitus    Fainting episodes    Gastroesophageal reflux disease    Hyperlipidemia    controlled om meds    Sinus tachycardia     Social History   Socioeconomic History   Marital status: Divorced    Spouse name: Not on file   Number of children: 3   Years of education: Not on file   Highest education level: 12th grade  Occupational History   Not on file  Tobacco Use   Smoking status: Former    Current packs/day: 0.00    Types: Cigarettes    Start date:  02/17/1973    Quit date: 02/18/1975    Years since quitting: 48.5   Smokeless tobacco: Never  Vaping Use   Vaping status: Never Used  Substance and Sexual Activity   Alcohol use: Not Currently   Drug use: Never   Sexual activity: Not Currently    Birth control/protection: None  Other Topics Concern   Not on file  Social History Narrative   Not on file   Social Drivers of Health   Financial Resource Strain: Low Risk  (09/03/2023)   Overall Financial Resource Strain (CARDIA)    Difficulty of Paying Living Expenses: Not hard at all  Food Insecurity: No Food Insecurity (09/03/2023)   Hunger Vital Sign    Worried About Running Out of Food in the Last Year: Never true    Ran Out of Food in the Last Year: Never true  Transportation Needs: No Transportation Needs (09/03/2023)   PRAPARE - Administrator, Civil Service (Medical): No    Lack of Transportation (Non-Medical): No  Physical Activity: Insufficiently Active (09/03/2023)   Exercise Vital Sign    Days of Exercise per Week: 2 days    Minutes of Exercise per Session: 20 Alyssa  Stress: No Stress Concern Present (09/03/2023)   [employer] of Occupational Health -  Occupational Stress Questionnaire    Feeling of Stress : Not at all  Social Connections: Socially Isolated (09/03/2023)   Social Connection and Isolation Panel [NHANES]    Frequency of Communication with Friends and Family: Once a week    Frequency of Social Gatherings with Friends and Family: Once a week    Attends Religious Services: Never    Database administrator or Organizations: No    Attends Banker Meetings: Never    Marital Status: Divorced  Catering manager Violence: Not At Risk (05/02/2023)   Humiliation, Afraid, Rape, and Kick questionnaire    Fear of Current or Ex-Partner: No    Emotionally Abused: No    Physically Abused: No    Sexually Abused: No    Past Surgical History:  Procedure Laterality Date   ANAL FISSURE REPAIR      CATARACT EXTRACTION Bilateral 08/20/2022   CESAREAN SECTION     CORONARY IMAGING/OCT N/A 10/14/2020   Procedure: INTRAVASCULAR IMAGING/OCT;  Surgeon: Corky Crafts, MD;  Location: [facility] INVASIVE CV LAB;  Service: Cardiovascular;  Laterality: N/A;   CORONARY PRESSURE/FFR STUDY N/A 10/14/2020   Procedure: INTRAVASCULAR PRESSURE WIRE/FFR STUDY;  Surgeon: Corky Crafts, MD;  Location: [facility] INVASIVE CV LAB;  Service: Cardiovascular;  Laterality: N/A;   CORONARY STENT INTERVENTION N/A 10/14/2020   Procedure: CORONARY STENT INTERVENTION;  Surgeon: Corky Crafts, MD;  Location: [facility] INVASIVE CV LAB;  Service: Cardiovascular;  Laterality: N/A;   EYE SURGERY     LEFT HEART CATH AND CORONARY ANGIOGRAPHY N/A 10/14/2020   Procedure: LEFT HEART CATH AND CORONARY ANGIOGRAPHY;  Surgeon: Corky Crafts, MD;  Location: [facility] INVASIVE CV LAB;  Service: Cardiovascular;  Laterality: N/A;   TONSILLECTOMY Bilateral    TUBAL LIGATION      Family History  Problem Relation Age of Onset   Cancer Mother    Diabetes Mother    Miscarriages / [place] Mother    Hypertension Father    Heart attack Father    Cancer Sister    Diabetes Sister    Diabetes Brother    Heart attack Brother    Kidney disease Brother    Hyperlipidemia Brother    Colon cancer Neg Hx    Colon polyps Neg Hx    Esophageal cancer Neg Hx    Rectal cancer Neg Hx    Stomach cancer Neg Hx    Inflammatory bowel disease Neg Hx    Liver disease Neg Hx    Pancreatic cancer Neg Hx     No Known Allergies  Current Outpatient Medications on File Prior to Visit  Medication Sig Dispense Refill   Ascorbic Acid (VITAMIN C) 1000 MG tablet Take 1,000 mg by mouth daily.     atorvastatin (LIPITOR) 40 MG tablet Take 1 tablet (40 mg total) by mouth every evening for cholestrol. 90 tablet 2   clobetasol (TEMOVATE) 0.05 % external solution Apply 1 application topically to dry scalp once daily as needed for flares. 50 mL 3   clopidogrel  (PLAVIX) 75 MG tablet Take 1 tablet (75 mg total) by mouth daily. 90 tablet 3   Cyanocobalamin (VITAMIN B-12) 5000 MCG SUBL Place 5,000 mcg under the tongue daily.     glipiZIDE (GLUCOTROL) 10 MG tablet Take 1 tablet (10 mg total) by mouth 2 (two) times daily before a meal for diabetes. 180 tablet 0   glucose blood (ONETOUCH VERIO) test strip use to test blood sugar up to 4 (four) times daily. 300  strip 3   Insulin Pen Needle (TECHLITE PLUS PEN NEEDLES) 32G X 4 MM MISC Use once daily with insulin. 100 each 3   Lancets (ONETOUCH DELICA PLUS LANCET30G) MISC USE TO TEST BLOOD SUGAR UP TO 4 TIMES A DAY 300 each 5   Lifitegrast (XIIDRA) 5 % SOLN Place 1 drop into both eyes 2 (two) times daily. 180 each 4   linaclotide (LINZESS) 145 MCG CAPS capsule Take 1 capsule (145 mcg total) by mouth daily before breakfast. 30 capsule 6   Magnesium 250 MG TABS Take 250 mg by mouth daily.     metFORMIN (GLUCOPHAGE-XR) 500 MG 24 hr tablet Take 1 tablet (500 mg total) by mouth 2 (two) times daily for diabetes. 180 tablet 1   metoprolol succinate (TOPROL-XL) 25 MG 24 hr tablet Take 1 tablet (25 mg total) by mouth daily for heart rate. 90 tablet 2   nitroGLYCERIN (NITROSTAT) 0.4 MG SL tablet Place 1 tablet (0.4 mg total) under the tongue every 5 (five) minutes as needed for chest pain. 25 tablet 3   tobramycin (TOBREX) 0.3 % ophthalmic solution      tobramycin (TOBREX) 0.3 % ophthalmic solution Place 1 drop into both eyes 4 (four) times daily beginning 1 day before procedure, the day of procedure, and the day after procedure. Continue as directed. 5 mL 5   zinc gluconate 50 MG tablet Take 50 mg by mouth daily.     RSV vaccine recomb adjuvanted (AREXVY) 120 MCG/0.5ML injection Inject into the muscle. (Patient not taking: Reported on 09/05/2023) 1 each 0   No current facility-administered medications on file prior to visit.    BP (!) 148/82   Pulse 80   Temp 97.7 F (36.5 C) (Temporal)   Ht 5\' 7"  (1.702 m)   Wt 220  lb (99.8 kg)   SpO2 98%   BMI 34.46 kg/m  Objective:   Physical Exam Cardiovascular:     Rate and Rhythm: Normal rate and regular rhythm.  Pulmonary:     Effort: Pulmonary effort is normal.     Breath sounds: Normal breath sounds.  Musculoskeletal:     Cervical back: Neck supple.  Skin:    General: Skin is warm and dry.  Neurological:     Mental Status: She is alert and oriented to person, place, and time.  Psychiatric:        Mood and Affect: Mood normal.           Assessment & Plan:  Type 2 diabetes mellitus with diabetic neuropathy, without long-term current use of insulin (HCC) Assessment & Plan: Improved with A1c of 7.8 today, would like to see her below 7.0.  Would also like to wean her off of insulin due to weight gain.  Increase Januvia to 100 mg daily. Continue metformin ER 500 mg twice daily, glipizide 10 mg twice daily. Reduce Lantus to 15 units daily.  Prescription for CGM, freestyle libre 3+ sensor sent to pharmacy.  Discussed instructions for use.  Repeat A1c in 3 months, follow-up in 6 months.  Orders: -     POCT glycosylated hemoglobin (Hb A1C) -     FreeStyle Libre 3 Plus Sensor; Use to check blood sugar continuously. Change sensor every 15 days.  Dispense: 6 each; Refill: 1 -     SITagliptin Phosphate; Take 1 tablet (100 mg total) by mouth daily. for diabetes.  Dispense: 90 tablet; Refill: 1 -     Insulin Glargine; Inject 15 Units into the skin every  evening.        Doreene Nest, NP

## 2023-09-05 NOTE — Assessment & Plan Note (Signed)
Improved with A1c of 7.8 today, would like to see her below 7.0.  Would also like to wean her off of insulin due to weight gain.  Increase Januvia to 100 mg daily. Continue metformin ER 500 mg twice daily, glipizide 10 mg twice daily. Reduce Lantus to 15 units daily.  Prescription for CGM, freestyle libre 3+ sensor sent to pharmacy.  Discussed instructions for use.  Repeat A1c in 3 months, follow-up in 6 months.

## 2023-09-06 ENCOUNTER — Other Ambulatory Visit: Payer: Self-pay

## 2023-09-09 ENCOUNTER — Other Ambulatory Visit: Payer: Self-pay

## 2023-09-12 ENCOUNTER — Encounter: Payer: Self-pay | Admitting: Cardiovascular Disease

## 2023-09-12 NOTE — Progress Notes (Signed)
Cardiology Office Note:    Date:  09/13/2023   ID:  Alyssa Stone, DOB 1955-11-22, MRN [id number]  PCP:  Doreene Nest, NP  [facility] Cardiologist:  Zonnie Landen [facility] Electrophysiologist:  None   Referring MD: Doreene Nest, NP   Chief Complaint  Patient presents with   Hyperlipidemia   Coronary Artery Disease          Jan. 27, 2022   Alyssa Stone is a 68 y.o. female with a hx of  Sinus tachycardia. Hx of DM, HLD  + Fam hx of CAD  We were asked to see her today by Mayra Reel, NP for further evaluation of her sinus tachycardia.  EKGrevealed sinus tachycardia at a rate of 127.  She was started on metoprolol. Blood work revealed a normal TSH of 4.14.  Her white blood cell count was mildly elevated at 11.9.  Her basic metabolic profile was within normal limits with the exception of glucose of 102. Urinalysis revealed cloudy urine.  There was 3+ leukocytes and she was positive for protein.  Nitrates were negative.  Has DOE with climbing stairs.  Does not exercise Cleans housees, does yard work .  No chest pain .   Has occasional exertional chest pressure , has diabetes Had covid a year ago - has had covid twice feb and then thanksgiving  Has not felt well since having covid.    Knows to take vit. C, D, zinc Mother had cad / angina  - treated medically Father had CABG   Feb. 18, 2022: Alyssa Stone is seen today for follow up of her DOE , HLD. Seen with daughter, heather.   Coronary CT angio reveals a coronary calcium score of 0 but she has a non calcified proximal LAD stenosis. She is here to discuss cath. Has continued to have chest heaviness -typically occurs when she is outside working in the yard work walks upstairs. The pain resolves if she stops and rest. Will schedule for cath ASA 81 mg a day  NTG as needed.   Oct. 28, 2022  Alyssa Stone is seen today for follow up of her DOE.  Was found to have significant coronary calcifications Cath on Feb. 2022  showed a proximal LAD stenosis with was stented She had a distal LAD stenosis that was treated medically  Feeling much better  Is back exercising  Is getting injections into her eyes.  She has been able to continue her DAPT with these eye  injections    Jan. 24, 2025 Alyssa Stone is seen for follow up of her CAD - s/p PCI of her prox LAD Has a distal LAD stenosis Wt is  220  lbs   Has mild leg edema Edema resolves every morning .  She avoids fast food for the most part   Mayra Reel , NP follows her lipids  Her last LDL is 47    Past Medical History:  Diagnosis Date   Allergy    Anemia    CAD (coronary artery disease)    s/p DES to o-pLAD in 09/2020 // dLAD 75 (small, med Rx)   Cataract    Chicken pox    Depression    Diabetes mellitus    Fainting episodes    Gastroesophageal reflux disease    Hyperlipidemia    controlled om meds    Sinus tachycardia     Past Surgical History:  Procedure Laterality Date   ANAL FISSURE REPAIR     CATARACT EXTRACTION Bilateral  08/20/2022   CESAREAN SECTION     CORONARY IMAGING/OCT N/A 10/14/2020   Procedure: INTRAVASCULAR IMAGING/OCT;  Surgeon: Corky Crafts, MD;  Location: [facility] INVASIVE CV LAB;  Service: Cardiovascular;  Laterality: N/A;   CORONARY PRESSURE/FFR STUDY N/A 10/14/2020   Procedure: INTRAVASCULAR PRESSURE WIRE/FFR STUDY;  Surgeon: Corky Crafts, MD;  Location: [facility] INVASIVE CV LAB;  Service: Cardiovascular;  Laterality: N/A;   CORONARY STENT INTERVENTION N/A 10/14/2020   Procedure: CORONARY STENT INTERVENTION;  Surgeon: Corky Crafts, MD;  Location: [facility] INVASIVE CV LAB;  Service: Cardiovascular;  Laterality: N/A;   EYE SURGERY     LEFT HEART CATH AND CORONARY ANGIOGRAPHY N/A 10/14/2020   Procedure: LEFT HEART CATH AND CORONARY ANGIOGRAPHY;  Surgeon: Corky Crafts, MD;  Location: [facility] INVASIVE CV LAB;  Service: Cardiovascular;  Laterality: N/A;   TONSILLECTOMY Bilateral    TUBAL LIGATION      Current  Medications: Current Meds  Medication Sig   Ascorbic Acid (VITAMIN C) 1000 MG tablet Take 1,000 mg by mouth daily.   atorvastatin (LIPITOR) 40 MG tablet Take 1 tablet (40 mg total) by mouth every evening for cholestrol.   clobetasol (TEMOVATE) 0.05 % external solution Apply 1 application topically to dry scalp once daily as needed for flares.   clopidogrel (PLAVIX) 75 MG tablet Take 1 tablet (75 mg total) by mouth daily.   Continuous Glucose Sensor (FREESTYLE LIBRE 3 PLUS SENSOR) MISC Use to check blood sugar continuously. Change sensor every 15 days.   Cyanocobalamin (VITAMIN B-12) 5000 MCG SUBL Place 5,000 mcg under the tongue daily.   glipiZIDE (GLUCOTROL) 10 MG tablet Take 1 tablet (10 mg total) by mouth 2 (two) times daily before a meal for diabetes.   glucose blood (ONETOUCH VERIO) test strip use to test blood sugar up to 4 (four) times daily.   insulin glargine (LANTUS) 100 UNIT/ML Solostar Pen Inject 15 Units into the skin every evening.   Insulin Pen Needle (TECHLITE PLUS PEN NEEDLES) 32G X 4 MM MISC Use once daily with insulin.   Lancets (ONETOUCH DELICA PLUS LANCET30G) MISC USE TO TEST BLOOD SUGAR UP TO 4 TIMES A DAY   Lifitegrast (XIIDRA) 5 % SOLN Place 1 drop into both eyes 2 (two) times daily.   linaclotide (LINZESS) 145 MCG CAPS capsule Take 1 capsule (145 mcg total) by mouth daily before breakfast.   Magnesium 250 MG TABS Take 250 mg by mouth daily.   metFORMIN (GLUCOPHAGE-XR) 500 MG 24 hr tablet Take 1 tablet (500 mg total) by mouth 2 (two) times daily for diabetes.   metoprolol succinate (TOPROL-XL) 25 MG 24 hr tablet Take 1 tablet (25 mg total) by mouth daily for heart rate.   RSV vaccine recomb adjuvanted (AREXVY) 120 MCG/0.5ML injection Inject into the muscle.   sitaGLIPtin (JANUVIA) 100 MG tablet Take 1 tablet (100 mg total) by mouth daily. for diabetes.   tobramycin (TOBREX) 0.3 % ophthalmic solution    tobramycin (TOBREX) 0.3 % ophthalmic solution Place 1 drop into both  eyes 4 (four) times daily beginning 1 day before procedure, the day of procedure, and the day after procedure. Continue as directed.   zinc gluconate 50 MG tablet Take 50 mg by mouth daily.   [DISCONTINUED] nitroGLYCERIN (NITROSTAT) 0.4 MG SL tablet Place 1 tablet (0.4 mg total) under the tongue every 5 (five) minutes as needed for chest pain.     Allergies:   Patient has no known allergies.   Social History   Socioeconomic History  Marital status: Divorced    Spouse name: Not on file   Number of children: 3   Years of education: Not on file   Highest education level: 12th grade  Occupational History   Not on file  Tobacco Use   Smoking status: Former    Current packs/day: 0.00    Types: Cigarettes    Start date: 02/17/1973    Quit date: 02/18/1975    Years since quitting: 48.6   Smokeless tobacco: Never  Vaping Use   Vaping status: Never Used  Substance and Sexual Activity   Alcohol use: Not Currently   Drug use: Never   Sexual activity: Not Currently    Birth control/protection: None  Other Topics Concern   Not on file  Social History Narrative   Not on file   Social Drivers of Health   Financial Resource Strain: Low Risk  (09/03/2023)   Overall Financial Resource Strain (CARDIA)    Difficulty of Paying Living Expenses: Not hard at all  Food Insecurity: No Food Insecurity (09/03/2023)   Hunger Vital Sign    Worried About Running Out of Food in the Last Year: Never true    Ran Out of Food in the Last Year: Never true  Transportation Needs: No Transportation Needs (09/03/2023)   PRAPARE - Administrator, Civil Service (Medical): No    Lack of Transportation (Non-Medical): No  Physical Activity: Insufficiently Active (09/03/2023)   Exercise Vital Sign    Days of Exercise per Week: 2 days    Minutes of Exercise per Session: 20 min  Stress: No Stress Concern Present (09/03/2023)   [employer] of Occupational Health - Occupational Stress Questionnaire     Feeling of Stress : Not at all  Social Connections: Socially Isolated (09/03/2023)   Social Connection and Isolation Panel [NHANES]    Frequency of Communication with Friends and Family: Once a week    Frequency of Social Gatherings with Friends and Family: Once a week    Attends Religious Services: Never    Database administrator or Organizations: No    Attends Engineer, structural: Never    Marital Status: Divorced     Family History: The patient's family history includes Cancer in her mother and sister; Diabetes in her brother, mother, and sister; Heart attack in her brother and father; Hyperlipidemia in her brother; Hypertension in her father; Kidney disease in her brother; Miscarriages / [place] in her mother. There is no history of Colon cancer, Colon polyps, Esophageal cancer, Rectal cancer, Stomach cancer, Inflammatory bowel disease, Liver disease, or Pancreatic cancer.  ROS:   Please see the history of present illness.     All other systems reviewed and are negative.  EKGs/Labs/Other Studies Reviewed:    The following studies were reviewed today:   EKG:      EKG Interpretation Date/Time:  Friday September 13 2023 08:56:02 EST Ventricular Rate:  75 PR Interval:  184 QRS Duration:  98 QT Interval:  376 QTC Calculation: 419 R Axis:   -40  Text Interpretation: Normal sinus rhythm Left axis deviation When compared with ECG of 14-Oct-2020 14:20, No significant change was found Confirmed by Kristeen Miss ([id number]) on 09/13/2023 9:09:55 AM    Recent Labs: 02/28/2023: ALT 10; BUN 16; Creatinine, Ser 0.58; Potassium 4.9; Sodium 137 08/23/2023: TSH 3.67  Recent Lipid Panel    Component Value Date/Time   CHOL 119 02/28/2023 1013   CHOL 119 06/16/2021 0846   TRIG 192.0 (H)  02/28/2023 1013   HDL 33.80 (L) 02/28/2023 1013   HDL 32 (L) 06/16/2021 0846   CHOLHDL 4 02/28/2023 1013   VLDL 38.4 02/28/2023 1013   LDLCALC 47 02/28/2023 1013   LDLCALC 58 06/16/2021 0846    LDLDIRECT 137.0 09/03/2019 1225     Risk Assessment/Calculations:       Physical Exam:    Physical Exam: Blood pressure 126/65, pulse 77, height 5\' 7"  (1.702 m), weight 220 lb 6.4 oz (100 kg), SpO2 98%.      GEN:  moderately obese female  in no acute distress HEENT: Normal NECK: No JVD; No carotid bruits LYMPHATICS: No lymphadenopathy CARDIAC: RRR , no murmurs, rubs, gallops RESPIRATORY:  Clear to auscultation without rales, wheezing or rhonchi  ABDOMEN: Soft, non-tender, non-distended MUSCULOSKELETAL:  mild leg edema , R > L   SKIN: Warm and dry NEUROLOGIC:  Alert and oriented x 3    ASSESSMENT:    1. Coronary artery disease involving native coronary artery of native heart without angina pectoris   2. Sinus tachycardia   3. Mixed hyperlipidemia   4. CAD in native artery      PLAN:      Sinus tach:    better   2.   Coronary artery disease:  s/p stenting of prox LAD.   She has a distal LAD  We will continue aggressive approach to her lipids.  LDL is 47  Continue current medications.        Medication Adjustments/Labs and Tests Ordered: Current medicines are reviewed at length with the patient today.  Concerns regarding medicines are outlined above.  Orders Placed This Encounter  Procedures   EKG 12-Lead    Meds ordered this encounter  Medications   nitroGLYCERIN (NITROSTAT) 0.4 MG SL tablet    Sig: Place 1 tablet (0.4 mg total) under the tongue every 5 (five) minutes as needed for chest pain.    Dispense:  25 tablet    Refill:  3      Patient Instructions  Follow-Up: At [facility], you and your health needs are our priority.  As part of our continuing mission to provide you with exceptional heart care, we have created designated Provider Care Teams.  These Care Teams include your primary Cardiologist (physician) and Advanced Practice Providers (APPs -  Physician Assistants and Nurse Practitioners) who all work together to provide you  with the care you need, when you need it.  We recommend signing up for the patient portal called "MyChart".  Sign up information is provided on this After Visit Summary.  MyChart is used to connect with patients for Virtual Visits (Telemedicine).  Patients are able to view lab/test results, encounter notes, upcoming appointments, etc.  Non-urgent messages can be sent to your provider as well.   To learn more about what you can do with MyChart, go to [email or web address].    Your next appointment:   1 year(s)  Provider:   Alverda Skeans, MD  Other Instructions   1st Floor: - Lobby - Registration  - Pharmacy  - Lab - Cafe  2nd Floor: - PV Lab - Diagnostic Testing (echo, CT, nuclear med)  3rd Floor: - Vacant  4th Floor: - TCTS (cardiothoracic surgery) - AFib Clinic - Structural Heart Clinic - Vascular Surgery  - Vascular Ultrasound  5th Floor: - HeartCare Cardiology (general and EP) - Clinical Pharmacy for coumadin, hypertension, lipid, weight-loss medications, and med management appointments    Valet parking services will be available as  well.          Signed, Kristeen Miss, MD  09/13/2023 9:19 AM    [facility]

## 2023-09-13 ENCOUNTER — Other Ambulatory Visit: Payer: Self-pay

## 2023-09-13 ENCOUNTER — Encounter: Payer: Self-pay | Admitting: Cardiovascular Disease

## 2023-09-13 ENCOUNTER — Ambulatory Visit: Payer: PPO | Attending: Cardiovascular Disease | Admitting: Cardiovascular Disease

## 2023-09-13 VITALS — BP 126/65 | HR 77 | Ht 67.0 in | Wt 220.4 lb

## 2023-09-13 DIAGNOSIS — E782 Mixed hyperlipidemia: Secondary | ICD-10-CM

## 2023-09-13 DIAGNOSIS — I2510 Atherosclerotic heart disease of native coronary artery without angina pectoris: Secondary | ICD-10-CM

## 2023-09-13 DIAGNOSIS — R000 Tachycardia, unspecified: Secondary | ICD-10-CM

## 2023-09-13 MED ORDER — NITROGLYCERIN 0.4 MG SL SUBL
0.4000 mg | SUBLINGUAL_TABLET | SUBLINGUAL | 3 refills | Status: AC | PRN
  Filled 2023-09-13: qty 25, 8d supply, fill #0

## 2023-09-13 NOTE — Patient Instructions (Signed)
Follow-Up: At [facility], you and your health needs are our priority.  As part of our continuing mission to provide you with exceptional heart care, we have created designated Provider Care Teams.  These Care Teams include your primary Cardiologist (physician) and Advanced Practice Providers (APPs -  Physician Assistants and Nurse Practitioners) who all work together to provide you with the care you need, when you need it.  We recommend signing up for the patient portal called "MyChart".  Sign up information is provided on this After Visit Summary.  MyChart is used to connect with patients for Virtual Visits (Telemedicine).  Patients are able to view lab/test results, encounter notes, upcoming appointments, etc.  Non-urgent messages can be sent to your provider as well.   To learn more about what you can do with MyChart, go to [email or web address].    Your next appointment:   1 year(s)  Provider:   Alverda Skeans, MD  Other Instructions   1st Floor: - Lobby - Registration  - Pharmacy  - Lab - Cafe  2nd Floor: - PV Lab - Diagnostic Testing (echo, CT, nuclear med)  3rd Floor: - Vacant  4th Floor: - TCTS (cardiothoracic surgery) - AFib Clinic - Structural Heart Clinic - Vascular Surgery  - Vascular Ultrasound  5th Floor: - HeartCare Cardiology (general and EP) - Clinical Pharmacy for coumadin, hypertension, lipid, weight-loss medications, and med management appointments    Valet parking services will be available as well.

## 2023-09-30 ENCOUNTER — Other Ambulatory Visit: Payer: Self-pay | Admitting: Primary Care

## 2023-09-30 ENCOUNTER — Other Ambulatory Visit: Payer: Self-pay

## 2023-09-30 DIAGNOSIS — E1140 Type 2 diabetes mellitus with diabetic neuropathy, unspecified: Secondary | ICD-10-CM

## 2023-09-30 MED FILL — Glipizide Tab 10 MG: ORAL | 90 days supply | Qty: 180 | Fill #0 | Status: AC

## 2023-10-01 ENCOUNTER — Other Ambulatory Visit: Payer: Self-pay

## 2023-10-15 ENCOUNTER — Other Ambulatory Visit: Payer: Self-pay | Admitting: Primary Care

## 2023-10-15 DIAGNOSIS — E1140 Type 2 diabetes mellitus with diabetic neuropathy, unspecified: Secondary | ICD-10-CM

## 2023-10-15 MED FILL — "Insulin Pen Needle 32 G X 4 MM (1/6"" or 5/32"")": 100 days supply | Qty: 100 | Fill #1 | Status: AC

## 2023-10-16 ENCOUNTER — Other Ambulatory Visit: Payer: Self-pay

## 2023-10-16 MED ORDER — METFORMIN HCL ER 500 MG PO TB24
500.0000 mg | ORAL_TABLET | Freq: Two times a day (BID) | ORAL | 1 refills | Status: DC
  Filled 2023-10-16: qty 180, 90d supply, fill #0
  Filled 2024-01-28: qty 180, 90d supply, fill #1

## 2023-10-21 ENCOUNTER — Ambulatory Visit: Payer: PPO | Admitting: Gastroenterology

## 2023-10-22 ENCOUNTER — Other Ambulatory Visit: Payer: Self-pay

## 2023-10-22 ENCOUNTER — Other Ambulatory Visit: Payer: Self-pay | Admitting: Cardiovascular Disease

## 2023-10-23 ENCOUNTER — Other Ambulatory Visit: Payer: Self-pay

## 2023-10-23 MED FILL — Clopidogrel Bisulfate Tab 75 MG (Base Equiv): ORAL | 90 days supply | Qty: 90 | Fill #0 | Status: AC

## 2023-10-25 ENCOUNTER — Ambulatory Visit: Payer: PPO | Admitting: Nurse Practitioner

## 2023-10-25 ENCOUNTER — Encounter: Payer: Self-pay | Admitting: Nurse Practitioner

## 2023-10-25 ENCOUNTER — Other Ambulatory Visit

## 2023-10-25 VITALS — BP 124/68 | HR 68 | Ht 67.0 in | Wt 220.0 lb

## 2023-10-25 DIAGNOSIS — Z860101 Personal history of adenomatous and serrated colon polyps: Secondary | ICD-10-CM | POA: Diagnosis not present

## 2023-10-25 DIAGNOSIS — K219 Gastro-esophageal reflux disease without esophagitis: Secondary | ICD-10-CM | POA: Diagnosis not present

## 2023-10-25 DIAGNOSIS — R1310 Dysphagia, unspecified: Secondary | ICD-10-CM | POA: Diagnosis not present

## 2023-10-25 DIAGNOSIS — Z8719 Personal history of other diseases of the digestive system: Secondary | ICD-10-CM

## 2023-10-25 DIAGNOSIS — K5909 Other constipation: Secondary | ICD-10-CM

## 2023-10-25 LAB — CBC
HCT: 39.7 % (ref 36.0–46.0)
Hemoglobin: 13.3 g/dL (ref 12.0–15.0)
MCHC: 33.5 g/dL (ref 30.0–36.0)
MCV: 90.5 fl (ref 78.0–100.0)
Platelets: 299 10*3/uL (ref 150.0–400.0)
RBC: 4.38 Mil/uL (ref 3.87–5.11)
RDW: 13.3 % (ref 11.5–15.5)
WBC: 9 10*3/uL (ref 4.0–10.5)

## 2023-10-25 MED ORDER — SUFLAVE 178.7 G PO SOLR: 1.0000 | Freq: Once | ORAL | 0 refills | Status: AC

## 2023-10-25 NOTE — Progress Notes (Signed)
 10/25/2023 Alyssa Stone [id number] [birth year]   Chief Complaint: Follow-up constipation, schedule colonoscopy  History of Present Illness: Alyssa Stone is a 68 year old female with a past medical history of depression, hyperlipidemia, sinus tachycardia on metoprolol, CAD status post DES 09/2020, diabetes mellitus type 2, GERD and colon polyps.   She was last seen in office by Dr. Meridee Score 08/23/2023 due to having dysphagia and chronic constipation.  An EGD and colonoscopy were discussed and were recommended to complete later this year after her constipation improved.  She is known by Dr. Meridee Score.  She presents today for follow-up regarding constipation and to schedule a colonoscopy.  She stated her constipation has significantly improved on Linzess 145 mcg 1 tablet daily which resulted in passing 1 normal formed brown bowel movement daily.  No diarrhea.  No bloody or black stools.  No abdominal pain.  She has infrequent acid reflux if she eats spicy foods relieved by taking Tums.  She also describes having dysphagia, every now and then she swallows food which gets stuck in her throat which results in gagging and vomiting up the stuck food with a lot saliva.  She noted several episodes of dysphagia occurred after eating beef.  Her episodes of dysphagia occur sporadically once every few months for the past few years.  She underwent a colonoscopy 11/2019 which identified four  3 to 10 mm tubular adenomatous polyps removed from the colon and diverticulosis in the rectosigmoid colon, sigmoid and descending colon.  She was advised to repeat a colonoscopy in 3 years. She has a history of coronary artery disease status post stent DES 09/2020 on Plavix.  She denies having any chest pain or dyspnea with exertion.  She was last seen by her cardiologist Dr. Elease Hashimoto on 09/13/2023 and her cardiac status was stable at that time.     Latest Ref Rng & Units 02/22/2022    9:04 AM 10/07/2020   10:16 AM  08/10/2020   12:03 PM  CBC  WBC 4.0 - 10.5 K/uL 10.2  10.7  11.9   Hemoglobin 12.0 - 15.0 g/dL 40.9  81.1  91.4   Hematocrit 36.0 - 46.0 % 39.2  38.8  37.9   Platelets 150.0 - 400.0 K/uL 296.0  377  340.0        Latest Ref Rng & Units 02/28/2023   10:13 AM 02/22/2022    9:04 AM 06/16/2021    8:46 AM  CMP  Glucose 70 - 99 mg/dL 782  956  80   BUN 6 - 23 mg/dL 16  13  12    Creatinine 0.40 - 1.20 mg/dL 2.13  0.86  5.78   Sodium 135 - 145 mEq/L 137  138  142   Potassium 3.5 - 5.1 mEq/L 4.9  4.4  4.5   Chloride 96 - 112 mEq/L 101  100  101   CO2 19 - 32 mEq/L 27  30  24    Calcium 8.4 - 10.5 mg/dL 9.9  9.9  46.9   Total Protein 6.0 - 8.3 g/dL 6.9  7.2    Total Bilirubin 0.2 - 1.2 mg/dL 0.5  0.3    Alkaline Phos 39 - 117 U/L 70  55    AST 0 - 37 U/L 13  11    ALT 0 - 35 U/L 10  10  10       Past Medical History:  Diagnosis Date   Allergy    Anemia    CAD (coronary artery disease)  s/p DES to o-pLAD in 09/2020 // dLAD 75 (small, med Rx)   Cataract    Chicken pox    Depression    Diabetes mellitus    Fainting episodes    Gastroesophageal reflux disease    Hyperlipidemia    controlled om meds    Sinus tachycardia    Past Surgical History:  Procedure Laterality Date   ANAL FISSURE REPAIR     CATARACT EXTRACTION Bilateral 08/20/2022   CESAREAN SECTION     CORONARY IMAGING/OCT N/A 10/14/2020   Procedure: INTRAVASCULAR IMAGING/OCT;  Surgeon: Corky Crafts, MD;  Location: [facility] INVASIVE CV LAB;  Service: Cardiovascular;  Laterality: N/A;   CORONARY PRESSURE/FFR STUDY N/A 10/14/2020   Procedure: INTRAVASCULAR PRESSURE WIRE/FFR STUDY;  Surgeon: Corky Crafts, MD;  Location: [facility] INVASIVE CV LAB;  Service: Cardiovascular;  Laterality: N/A;   CORONARY STENT INTERVENTION N/A 10/14/2020   Procedure: CORONARY STENT INTERVENTION;  Surgeon: Corky Crafts, MD;  Location: [facility] INVASIVE CV LAB;  Service: Cardiovascular;  Laterality: N/A;   EYE SURGERY     LEFT HEART CATH AND  CORONARY ANGIOGRAPHY N/A 10/14/2020   Procedure: LEFT HEART CATH AND CORONARY ANGIOGRAPHY;  Surgeon: Corky Crafts, MD;  Location: [facility] INVASIVE CV LAB;  Service: Cardiovascular;  Laterality: N/A;   TONSILLECTOMY Bilateral    TUBAL LIGATION     PAST GI PROCEDURES:  Colonoscopy 11/26/2019 - Hemorrhoids found on digital rectal exam as well as external skin tags.  - Stool in the entire examined colon. Lavaged copiously in order to obtain adequate preparation.  - Four 3 to 10 mm polyps in the sigmoid colon, in the descending colon, in the transverse colon and in the ascending colon, removed with a cold snare. Resected and retrieved.  - Diverticulosis in the recto-sigmoid colon, in the sigmoid colon and in the descending colon.  - Normal mucosa in the entire examined colon otherwise.  - Non-bleeding non-thrombosed external and internal hemorrhoids. - 3 year recall  - TUBULAR ADENOMA (X4 FRAGMENTS). - NO HIGH GRADE DYSPLASIA OR MALIGNANCY.  Current Outpatient Medications on File Prior to Visit  Medication Sig Dispense Refill   Ascorbic Acid (VITAMIN C) 1000 MG tablet Take 1,000 mg by mouth daily.     atorvastatin (LIPITOR) 40 MG tablet Take 1 tablet (40 mg total) by mouth every evening for cholestrol. 90 tablet 2   clobetasol (TEMOVATE) 0.05 % external solution Apply 1 application topically to dry scalp once daily as needed for flares. 50 mL 3   clopidogrel (PLAVIX) 75 MG tablet Take 1 tablet (75 mg total) by mouth daily. 90 tablet 3   Continuous Glucose Sensor (FREESTYLE LIBRE 3 PLUS SENSOR) MISC Use to check blood sugar continuously. Change sensor every 15 days. 6 each 1   Cyanocobalamin (VITAMIN B-12) 5000 MCG SUBL Place 5,000 mcg under the tongue daily.     glipiZIDE (GLUCOTROL) 10 MG tablet Take 1 tablet (10 mg total) by mouth 2 (two) times daily before a meal for diabetes. 180 tablet 0   glucose blood (ONETOUCH VERIO) test strip use to test blood sugar up to 4 (four) times daily. 300  strip 3   insulin glargine (LANTUS) 100 UNIT/ML Solostar Pen Inject 15 Units into the skin every evening.     Insulin Pen Needle (TECHLITE PLUS PEN NEEDLES) 32G X 4 MM MISC Use once daily with insulin. 100 each 3   Lancets (ONETOUCH DELICA PLUS LANCET30G) MISC USE TO TEST BLOOD SUGAR UP TO 4 TIMES A DAY  300 each 5   Lifitegrast (XIIDRA) 5 % SOLN Place 1 drop into both eyes 2 (two) times daily. 180 each 4   linaclotide (LINZESS) 145 MCG CAPS capsule Take 1 capsule (145 mcg total) by mouth daily before breakfast. 30 capsule 6   Magnesium 250 MG TABS Take 250 mg by mouth daily.     metFORMIN (GLUCOPHAGE-XR) 500 MG 24 hr tablet Take 1 tablet (500 mg total) by mouth 2 (two) times daily for diabetes. 180 tablet 1   metoprolol succinate (TOPROL-XL) 25 MG 24 hr tablet Take 1 tablet (25 mg total) by mouth daily for heart rate. 90 tablet 2   nitroGLYCERIN (NITROSTAT) 0.4 MG SL tablet Place 1 tablet (0.4 mg total) under the tongue every 5 (five) minutes as needed for chest pain. 25 tablet 3   RSV vaccine recomb adjuvanted (AREXVY) 120 MCG/0.5ML injection Inject into the muscle. 1 each 0   sitaGLIPtin (JANUVIA) 100 MG tablet Take 1 tablet (100 mg total) by mouth daily. for diabetes. 90 tablet 1   tobramycin (TOBREX) 0.3 % ophthalmic solution      tobramycin (TOBREX) 0.3 % ophthalmic solution Place 1 drop into both eyes 4 (four) times daily beginning 1 day before procedure, the day of procedure, and the day after procedure. Continue as directed. 5 mL 5   zinc gluconate 50 MG tablet Take 50 mg by mouth daily.     No current facility-administered medications on file prior to visit.   No Known Allergies  Current Medications, Allergies, Past Medical History, Past Surgical History, Family History and Social History were reviewed in [employer] record.  Review of Systems:   Constitutional: Negative for fever, sweats, chills or weight loss.  Respiratory: Negative for shortness of breath.    Cardiovascular: Negative for chest pain, palpitations and leg swelling.  Gastrointestinal: See HPI.  Musculoskeletal: Negative for back pain or muscle aches.  Neurological: Negative for dizziness, headaches or paresthesias.   Physical Exam: BP 124/68   Pulse 68   Ht 5\' 7"  (1.702 m)   Wt 220 lb (99.8 kg)   BMI 34.46 kg/m  General: 68 year old female in no acute distress. Head: Normocephalic and atraumatic. Eyes: No scleral icterus. Conjunctiva pink . Ears: Normal auditory acuity. Mouth: Dentition intact. No ulcers or lesions.  Lungs: Clear throughout to auscultation. Heart: Regular rate and rhythm, no murmur. Abdomen: Soft, nontender and nondistended. No masses or hepatomegaly. Normal bowel sounds x 4 quadrants.  Rectal: Deferred.  Musculoskeletal: Symmetrical with no gross deformities. Extremities: No edema. Neurological: Alert oriented x 4. No focal deficits.  Psychological: Alert and cooperative. Normal mood and affect  Assessment and Recommendations:  68 year old female with a history of colon polyps. Colonoscopy 11/2019 which identified four 3 to 10 mm tubular adenomatous polyps removed from the colon and diverticulosis in the rectosigmoid colon, sigmoid and descending colon.  -Colonoscopy benefits and risks discussed including risk with sedation, risk of bleeding, perforation and infection.    Infrequent GERD with intermittent dysphagia with solid foods for the past few years -EGD with possible esophageal dilatation at time of colonoscopy -Barium swallow study if EGD unrevealing  Chronic constipation, significantly improved on Linzess -Continue Linzess 145 mcg 1 tab daily to be taken 30 minutes before breakfast  History of CAD s/p DES 09/2020 on Plavix.  No angina. -Our office will contact cardiologist Dr. Elease Hashimoto to verify Plavix hold instructions prior to proceeding with EGD and colonoscopy  Diabetes mellitus type 2 on Metformin and Januvia  Will check CBC today as  her last CBC was done 02/2022.

## 2023-10-25 NOTE — Patient Instructions (Addendum)
 Your provider has requested that you go to the basement level for lab work before leaving today. Press "B" on the elevator. The lab is located at the first door on the left as you exit the elevator.  You have been scheduled for an endoscopy and colonoscopy. Please follow the written instructions given to you at your visit today.  If you use inhalers (even only as needed), please bring them with you on the day of your procedure. ___________________________________________________________________________  Alyssa Stone will receive your bowel preparation through [facility], which ensures the lowest copay and home delivery, with outreach via text or call from an [phone] number. Please respond promptly to avoid rescheduling of your procedure. If you are interested in alternative options or have any questions regarding your prep, please contact them at [phone] ____________________________________________________________________________  Your Provider Has Sent Your Bowel Prep Regimen To [facility]   [facility] will contact you to verify your information and collect your copay, if applicable. Enjoy the comfort of your home while your prescription is mailed to you, FREE of any shipping charges.   [facility] accepts all major insurance benefits and applies discounts & coupons.  Have additional questions?   Chat: [email or web address] Call: [phone] Email: [email or web address] NCPDP: [id number]  How will [facility] contact you?  With a Welcome phone call,  a Welcome text and a checkout link in text form.  Texts you receive from [phone] Are NOT Spam.  *To set up delivery, you must complete the checkout process via link or speak to one of the patient care representatives. If [facility] is unable to reach you, your prescription may be delayed.  To avoid long hold times on the phone, you may also utilize the secure chat feature on the [facility] website to request that they call you back for  transaction completion or to expedite your concerns.  Due to recent changes in healthcare laws, you may see the results of your imaging and laboratory studies on MyChart before your provider has had a chance to review them.  We understand that in some cases there may be results that are confusing or concerning to you. Not all laboratory results come back in the same time frame and the provider may be waiting for multiple results in order to interpret others.  Please give [place] 48 hours in order for your provider to thoroughly review all the results before contacting the office for clarification of your results.   Thank you for trusting me with your gastrointestinal care!   Alcide Evener, CRNP

## 2023-10-27 NOTE — Progress Notes (Signed)
 Attending Physician's Attestation   I have reviewed the chart.   I agree with the Advanced Practitioner's note, impression, and recommendations with any updates as below. Agree with scheduling EGD/Colonoscopy as able with antiPLT hold per Cardiology.   Corliss Parish, MD [facility] Advanced Endoscopy Office # [phone]

## 2023-11-25 ENCOUNTER — Other Ambulatory Visit: Payer: Self-pay | Admitting: Primary Care

## 2023-11-25 DIAGNOSIS — E1140 Type 2 diabetes mellitus with diabetic neuropathy, unspecified: Secondary | ICD-10-CM

## 2023-12-06 ENCOUNTER — Other Ambulatory Visit: Payer: PPO

## 2023-12-11 ENCOUNTER — Other Ambulatory Visit: Payer: Self-pay

## 2023-12-11 ENCOUNTER — Other Ambulatory Visit: Payer: Self-pay | Admitting: Primary Care

## 2023-12-11 DIAGNOSIS — E1140 Type 2 diabetes mellitus with diabetic neuropathy, unspecified: Secondary | ICD-10-CM

## 2023-12-11 DIAGNOSIS — Z1231 Encounter for screening mammogram for malignant neoplasm of breast: Secondary | ICD-10-CM

## 2023-12-11 MED ORDER — INSULIN GLARGINE 100 UNIT/ML SOLOSTAR PEN
15.0000 [IU] | PEN_INJECTOR | Freq: Every evening | SUBCUTANEOUS | 0 refills | Status: DC
  Filled 2023-12-11: qty 15, 100d supply, fill #0

## 2023-12-13 ENCOUNTER — Encounter: Payer: Self-pay | Admitting: Gastroenterology

## 2023-12-13 ENCOUNTER — Other Ambulatory Visit

## 2023-12-13 DIAGNOSIS — E1140 Type 2 diabetes mellitus with diabetic neuropathy, unspecified: Secondary | ICD-10-CM | POA: Diagnosis not present

## 2023-12-13 LAB — POCT GLYCOSYLATED HEMOGLOBIN (HGB A1C): Hemoglobin A1C: 8.5 % — AB (ref 4.0–5.6)

## 2023-12-17 ENCOUNTER — Encounter: Payer: Self-pay | Admitting: Certified Registered Nurse Anesthetist

## 2023-12-18 ENCOUNTER — Telehealth: Payer: Self-pay

## 2023-12-18 NOTE — Telephone Encounter (Signed)
 This patient is having a double on 12/20/2023. How long does this patient need to hold Plavix  prior to the procedure?

## 2023-12-18 NOTE — Telephone Encounter (Signed)
 Alyssa Stone see note about this pt anti coag you scheduled for colon.[name]

## 2023-12-19 ENCOUNTER — Telehealth: Payer: Self-pay

## 2023-12-19 NOTE — Telephone Encounter (Signed)
 [facility] Pre-operative Risk Assessment     Request for surgical clearance:     Endoscopy Procedure  What type of surgery is being performed?     Endoscopy and colonoscopy  When is this surgery scheduled?     12/20/23  What type of clearance is required ?   Pharmacy  Are there any medications that need to be held prior to surgery and how long? Plavix  and 5 days (Patient stated she has not taken her Plavix  since Saturday to be on the safe side)  Practice name and name of physician performing surgery?      [facility]  What is your office phone and fax number?      Phone- [phone]  Fax- [phone]  Anesthesia type (None, local, MAC, general) ?       MAC   Please route your response to Lashena Signer, CMA

## 2023-12-19 NOTE — Telephone Encounter (Signed)
 Contacted patient and patient stated that she has not taken her Plavix  since Saturday. Please advise.

## 2023-12-19 NOTE — Telephone Encounter (Signed)
 Noted thank you

## 2023-12-19 NOTE — Telephone Encounter (Signed)
 Thanks team. I'll see her tomorrow. GM

## 2023-12-19 NOTE — Telephone Encounter (Signed)
 Let's make sure we finalize what we are doing for patient, since she has procedure scheduled tomorrow. Thanks. GM

## 2023-12-19 NOTE — Telephone Encounter (Signed)
   Patient Name: Alyssa Stone  DOB: 22-Oct-1955 MRN: [id number]  Primary Cardiologist: Ahmad Alert, MD  Chart reviewed as part of pre-operative protocol coverage.   Pharmacy request received for guidance regarding holding of Plavix  prior to colonoscopy.  Patient has history of CAD with a 26mm stent to the LAD, typically this does require comment from the primary cardiologist however per notes patient has already been holding Plavix  since Saturday and procedure is scheduled for tomorrow.  At this time she can continue to hold Plavix , would recommend that she continue aspirin  81 mg through the perioperative period.  Please resume Plavix  when safe to do so from a bleeding standpoint.   I will route this recommendation to the requesting party via Epic fax function and remove from pre-op pool.  Please call with questions.  Yuchen Fedor D Camdin Hegner, NP 12/19/2023, 9:52 AM

## 2023-12-19 NOTE — Telephone Encounter (Signed)
 Please get Cards approval, so we can move forward tomorrow. Thanks. GM

## 2023-12-19 NOTE — Telephone Encounter (Signed)
 Contacted patient and patient is aware to continue to hold Plavix  and continue aspirin  81 mg prior to her procedure per Cardiology.

## 2023-12-20 ENCOUNTER — Ambulatory Visit (AMBULATORY_SURGERY_CENTER): Admitting: Gastroenterology

## 2023-12-20 ENCOUNTER — Encounter: Payer: Self-pay | Admitting: Gastroenterology

## 2023-12-20 ENCOUNTER — Other Ambulatory Visit: Payer: Self-pay

## 2023-12-20 VITALS — BP 154/83 | HR 91 | Temp 97.3°F | Resp 12 | Ht 67.0 in | Wt 220.0 lb

## 2023-12-20 DIAGNOSIS — K2950 Unspecified chronic gastritis without bleeding: Secondary | ICD-10-CM | POA: Diagnosis not present

## 2023-12-20 DIAGNOSIS — K449 Diaphragmatic hernia without obstruction or gangrene: Secondary | ICD-10-CM | POA: Diagnosis not present

## 2023-12-20 DIAGNOSIS — R1310 Dysphagia, unspecified: Secondary | ICD-10-CM | POA: Diagnosis not present

## 2023-12-20 DIAGNOSIS — K2289 Other specified disease of esophagus: Secondary | ICD-10-CM | POA: Diagnosis not present

## 2023-12-20 DIAGNOSIS — K2970 Gastritis, unspecified, without bleeding: Secondary | ICD-10-CM

## 2023-12-20 DIAGNOSIS — K222 Esophageal obstruction: Secondary | ICD-10-CM | POA: Diagnosis not present

## 2023-12-20 DIAGNOSIS — K641 Second degree hemorrhoids: Secondary | ICD-10-CM | POA: Diagnosis not present

## 2023-12-20 DIAGNOSIS — K5730 Diverticulosis of large intestine without perforation or abscess without bleeding: Secondary | ICD-10-CM

## 2023-12-20 DIAGNOSIS — Z1211 Encounter for screening for malignant neoplasm of colon: Secondary | ICD-10-CM

## 2023-12-20 DIAGNOSIS — K644 Residual hemorrhoidal skin tags: Secondary | ICD-10-CM

## 2023-12-20 DIAGNOSIS — B9681 Helicobacter pylori [H. pylori] as the cause of diseases classified elsewhere: Secondary | ICD-10-CM

## 2023-12-20 DIAGNOSIS — K229 Disease of esophagus, unspecified: Secondary | ICD-10-CM

## 2023-12-20 DIAGNOSIS — Z860101 Personal history of adenomatous and serrated colon polyps: Secondary | ICD-10-CM

## 2023-12-20 MED ORDER — OMEPRAZOLE 20 MG PO CPDR
DELAYED_RELEASE_CAPSULE | ORAL | 0 refills | Status: DC
  Filled 2023-12-20: qty 90, 60d supply, fill #0

## 2023-12-20 MED ORDER — SODIUM CHLORIDE 0.9 % IV SOLN: 500.0000 mL | Freq: Once | INTRAVENOUS | Status: DC

## 2023-12-20 MED ORDER — OMEPRAZOLE 20 MG PO CPDR
20.0000 mg | DELAYED_RELEASE_CAPSULE | Freq: Two times a day (BID) | ORAL | 0 refills | Status: DC
  Filled 2023-12-20: qty 60, 30d supply, fill #0

## 2023-12-20 NOTE — Patient Instructions (Signed)
 The doctor wants you to take omeprazole  20 mg twice daily for a month , and then once daily for a month and stop.  Use Cepacol or Hall lozenges and/or Chloraseptic spray for the next 48-72 hours.  May restart plavix  on May 4th. Eat a high fiber diet, and take fiber-con 1-2 tablets daily.  See special diet for today.  Read your discharge instructions.   YOU HAD AN ENDOSCOPIC PROCEDURE TODAY AT THE [facility]:   Refer to the procedure report that was given to you for any specific questions about what was found during the examination.  If the procedure report does not answer your questions, please call your gastroenterologist to clarify.  If you requested that your care partner not be given the details of your procedure findings, then the procedure report has been included in a sealed envelope for you to review at your convenience later.  YOU SHOULD EXPECT: Some feelings of bloating in the abdomen. Passage of more gas than usual.  Walking can help get rid of the air that was put into your GI tract during the procedure and reduce the bloating. If you had a lower endoscopy (such as a colonoscopy or flexible sigmoidoscopy) you may notice spotting of blood in your stool or on the toilet paper. If you underwent a bowel prep for your procedure, you may not have a normal bowel movement for a few days.  Please Note:  You might notice some irritation and congestion in your nose or some drainage.  This is from the oxygen used during your procedure.  There is no need for concern and it should clear up in a day or so.  SYMPTOMS TO REPORT IMMEDIATELY:  Following lower endoscopy (colonoscopy or flexible sigmoidoscopy):  Excessive amounts of blood in the stool  Significant tenderness or worsening of abdominal pains  Swelling of the abdomen that is new, acute  Fever of 100F or higher  Following upper endoscopy (EGD)  Vomiting of blood or coffee ground material  New chest pain or pain under the  shoulder blades  Painful or persistently difficult swallowing  New shortness of breath  Fever of 100F or higher  Black, tarry-looking stools  For urgent or emergent issues, a gastroenterologist can be reached at any hour by calling [phone]. Do not use MyChart messaging for urgent concerns.    DIET:  We do recommend nothing by mouth until 11:30, the a lear liquid diet from 11:30-12:30.  You can have a soft diet for the rest of today.  A regular diet is fine for tomorrw.  Drink plenty of fluids but you should avoid alcoholic beverages.  ACTIVITY:  You should plan to take it easy for the rest of today and you should NOT DRIVE or use heavy machinery until tomorrow (because of the sedation medicines used during the test).    FOLLOW UP: Our staff will call the number listed on your records the next business day following your procedure.  We will call around 7:15- 8:00 am to check on you and address any questions or concerns that you may have regarding the information given to you following your procedure. If we do not reach you, we will leave a message.     If any biopsies were taken you will be contacted by phone or by letter within the next 1-3 weeks.  Please call us  at [phone] if you have not heard about the biopsies in 3 weeks.    SIGNATURES/CONFIDENTIALITY: You and/or your  care partner have signed paperwork which will be entered into your electronic medical record.  These signatures attest to the fact that that the information above on your After Visit Summary has been reviewed and is understood.  Full responsibility of the confidentiality of this discharge information lies with you and/or your care-partner.

## 2023-12-20 NOTE — Op Note (Signed)
 [facility] Patient Name: Alyssa Stone Procedure Date: 12/20/2023 9:49 AM MRN: [id number] Endoscopist: Yong Henle , MD, [id number] Age: 68 Referring MD:  Date of Birth: 1956-01-14 Gender: Female Account #: [id number] Procedure:                Colonoscopy Indications:              High risk colon cancer surveillance: Personal                            history of adenoma (10 mm or greater in size), High                            risk colon cancer surveillance: Personal history of                            non-advanced adenoma Medicines:                Monitored Anesthesia Care Procedure:                Pre-Anesthesia Assessment:                           - Prior to the procedure, a History and Physical                            was performed, and patient medications and                            allergies were reviewed. The patient's tolerance of                            previous anesthesia was also reviewed. The risks                            and benefits of the procedure and the sedation                            options and risks were discussed with the patient.                            All questions were answered, and informed consent                            was obtained. Prior Anticoagulants: The patient has                            taken Plavix  (clopidogrel ), last dose was 5 days                            prior to procedure. ASA Grade Assessment: III - A                            patient with severe systemic disease. After  reviewing the risks and benefits, the patient was                            deemed in satisfactory condition to undergo the                            procedure.                           After obtaining informed consent, the colonoscope                            was passed under direct vision. Throughout the                            procedure, the patient's blood pressure, pulse, and                             oxygen saturations were monitored continuously. The                            Olympus Scope SN [id number] was introduced through the                            anus and advanced to the 3 cm into the ileum. The                            colonoscopy was performed without difficulty. The                            patient tolerated the procedure. The quality of the                            bowel preparation was adequate. The ileocecal                            valve, appendiceal orifice, and rectum were                            photographed. Scope In: 10:13:48 AM Scope Out: 10:31:38 AM Scope Withdrawal Time: 0 hours 14 minutes 13 seconds  Total Procedure Duration: 0 hours 17 minutes 50 seconds  Findings:                 Skin tags were found on perianal exam.                           The digital rectal exam findings include                            hemorrhoids. Pertinent negatives include no                            palpable rectal lesions.  A large amount of semi-liquid stool was found in                            the entire colon, interfering with visualization.                            Lavage of the area was performed using copious                            amounts, resulting in clearance with adequate                            visualization.                           A single small-mouthed diverticulum was found in                            the hepatic flexure.                           Multiple small-mouthed diverticula were found in                            the recto-sigmoid colon, sigmoid colon and                            descending colon.                           Normal mucosa was found in the entire colon                            otherwise.                           Non-bleeding non-thrombosed external and internal                            hemorrhoids were found during retroflexion, during                             perianal exam and during digital exam. The                            hemorrhoids were Grade II (internal hemorrhoids                            that prolapse but reduce spontaneously). Complications:            No immediate complications. Estimated Blood Loss:     Estimated blood loss: none. Impression:               - Perianal skin tags found on perianal exam.                            Hemorrhoids found on  digital rectal exam.                           - Stool in the entire examined colon. Lavaged with                            adequate visualization.                           - Diverticulum at the hepatic flexure.                           - Diverticulosis in the recto-sigmoid colon, in the                            sigmoid colon and in the descending colon.                           - Normal mucosa in the entire examined colon                            othewise.                           - Non-bleeding non-thrombosed external and internal                            hemorrhoids. Recommendation:           - The patient will be observed post-procedure,                            until all discharge criteria are met.                           - Discharge patient to home.                           - Patient has a contact number available for                            emergencies. The signs and symptoms of potential                            delayed complications were discussed with the                            patient. Return to normal activities tomorrow.                            Written discharge instructions were provided to the                            patient.                           - High fiber diet.                           -  Use FiberCon 1-2 tablets PO daily.                           - May restart Plavix  on 5/4 to decrease risk of                            post-interventional bleeding.                           - Continue present medications.                            - Repeat colonoscopy in 5 years for surveillance.                            Next colonoscopy should do 1 week of Miralax QD +                            Dulcolax QOD before bowel preparation.                           - The findings and recommendations were discussed                            with the patient.                           - The findings and recommendations were discussed                            with the patient's family. Yong Henle, MD 12/20/2023 10:44:23 AM

## 2023-12-20 NOTE — Progress Notes (Unsigned)
 GASTROENTEROLOGY PROCEDURE H&P NOTE   Primary Care Physician: Irena Gaydos John, NP  HPI: Alyssa Stone is a 68 y.o. female who presents for EGD/Colonoscopy for issues of dysphagia and surveillance of previous advanced adenomas.  Past Medical History:  Diagnosis Date   Allergy    Anemia    CAD (coronary artery disease)    s/p DES to o-pLAD in 09/2020 // dLAD 75 (small, med Rx)   Cataract    Chicken pox    Depression    Diabetes mellitus    Fainting episodes    Gastroesophageal reflux disease    Hyperlipidemia    controlled om meds    Sinus tachycardia    Past Surgical History:  Procedure Laterality Date   ANAL FISSURE REPAIR     CATARACT EXTRACTION Bilateral 08/20/2022   CESAREAN SECTION     CORONARY IMAGING/OCT N/A 10/14/2020   Procedure: INTRAVASCULAR IMAGING/OCT;  Surgeon: Lucendia Rusk, MD;  Location: [facility] INVASIVE CV LAB;  Service: Cardiovascular;  Laterality: N/A;   CORONARY PRESSURE/FFR STUDY N/A 10/14/2020   Procedure: INTRAVASCULAR PRESSURE WIRE/FFR STUDY;  Surgeon: Lucendia Rusk, MD;  Location: [facility] INVASIVE CV LAB;  Service: Cardiovascular;  Laterality: N/A;   CORONARY STENT INTERVENTION N/A 10/14/2020   Procedure: CORONARY STENT INTERVENTION;  Surgeon: Lucendia Rusk, MD;  Location: [facility] INVASIVE CV LAB;  Service: Cardiovascular;  Laterality: N/A;   EYE SURGERY     LEFT HEART CATH AND CORONARY ANGIOGRAPHY N/A 10/14/2020   Procedure: LEFT HEART CATH AND CORONARY ANGIOGRAPHY;  Surgeon: Lucendia Rusk, MD;  Location: [facility] INVASIVE CV LAB;  Service: Cardiovascular;  Laterality: N/A;   TONSILLECTOMY Bilateral    TUBAL LIGATION     Current Outpatient Medications  Medication Sig Dispense Refill   Ascorbic Acid (VITAMIN C) 1000 MG tablet Take 1,000 mg by mouth daily.     atorvastatin  (LIPITOR) 40 MG tablet Take 1 tablet (40 mg total) by mouth every evening for cholestrol. 90 tablet 2   clobetasol  (TEMOVATE ) 0.05 % external solution Apply 1  application topically to dry scalp once daily as needed for flares. 50 mL 3   clopidogrel  (PLAVIX ) 75 MG tablet Take 1 tablet (75 mg total) by mouth daily. 90 tablet 3   Continuous Glucose Sensor (FREESTYLE LIBRE 3 PLUS SENSOR) MISC Use to check blood sugar continuously. Change sensor every 15 days. 6 each 1   Cyanocobalamin  (VITAMIN B-12) 5000 MCG SUBL Place 5,000 mcg under the tongue daily.     glipiZIDE  (GLUCOTROL ) 10 MG tablet Take 1 tablet (10 mg total) by mouth 2 (two) times daily before a meal for diabetes. 180 tablet 0   glucose blood (ONETOUCH VERIO) test strip use to test blood sugar up to 4 (four) times daily. 300 strip 3   insulin  glargine (LANTUS ) 100 UNIT/ML Solostar Pen Inject 15 Units into the skin every evening. for diabetes. 15 mL 0   Insulin  Pen Needle (TECHLITE PLUS PEN NEEDLES) 32G X 4 MM MISC Use once daily with insulin . 100 each 3   Lancets (ONETOUCH DELICA PLUS LANCET30G) MISC USE TO TEST BLOOD SUGAR UP TO 4 TIMES A DAY 300 each 5   Lifitegrast  (XIIDRA ) 5 % SOLN Place 1 drop into both eyes 2 (two) times daily. 180 each 4   linaclotide  (LINZESS ) 145 MCG CAPS capsule Take 1 capsule (145 mcg total) by mouth daily before breakfast. 30 capsule 6   Magnesium 250 MG TABS Take 250 mg by mouth daily.  metFORMIN  (GLUCOPHAGE -XR) 500 MG 24 hr tablet Take 1 tablet (500 mg total) by mouth 2 (two) times daily for diabetes. 180 tablet 1   metoprolol  succinate (TOPROL -XL) 25 MG 24 hr tablet Take 1 tablet (25 mg total) by mouth daily for heart rate. 90 tablet 2   nitroGLYCERIN  (NITROSTAT ) 0.4 MG SL tablet Place 1 tablet (0.4 mg total) under the tongue every 5 (five) minutes as needed for chest pain. 25 tablet 3   RSV vaccine recomb adjuvanted (AREXVY ) 120 MCG/0.5ML injection Inject into the muscle. 1 each 0   sitaGLIPtin  (JANUVIA ) 100 MG tablet Take 1 tablet (100 mg total) by mouth daily. for diabetes. 90 tablet 1   tobramycin  (TOBREX ) 0.3 % ophthalmic solution      tobramycin  (TOBREX ) 0.3  % ophthalmic solution Place 1 drop into both eyes 4 (four) times daily beginning 1 day before procedure, the day of procedure, and the day after procedure. Continue as directed. 5 mL 5   zinc gluconate 50 MG tablet Take 50 mg by mouth daily.     No current facility-administered medications for this visit.    Current Outpatient Medications:    Ascorbic Acid (VITAMIN C) 1000 MG tablet, Take 1,000 mg by mouth daily., Disp: , Rfl:    atorvastatin  (LIPITOR) 40 MG tablet, Take 1 tablet (40 mg total) by mouth every evening for cholestrol., Disp: 90 tablet, Rfl: 2   clobetasol  (TEMOVATE ) 0.05 % external solution, Apply 1 application topically to dry scalp once daily as needed for flares., Disp: 50 mL, Rfl: 3   clopidogrel  (PLAVIX ) 75 MG tablet, Take 1 tablet (75 mg total) by mouth daily., Disp: 90 tablet, Rfl: 3   Continuous Glucose Sensor (FREESTYLE LIBRE 3 PLUS SENSOR) MISC, Use to check blood sugar continuously. Change sensor every 15 days., Disp: 6 each, Rfl: 1   Cyanocobalamin  (VITAMIN B-12) 5000 MCG SUBL, Place 5,000 mcg under the tongue daily., Disp: , Rfl:    glipiZIDE  (GLUCOTROL ) 10 MG tablet, Take 1 tablet (10 mg total) by mouth 2 (two) times daily before a meal for diabetes., Disp: 180 tablet, Rfl: 0   glucose blood (ONETOUCH VERIO) test strip, use to test blood sugar up to 4 (four) times daily., Disp: 300 strip, Rfl: 3   insulin  glargine (LANTUS ) 100 UNIT/ML Solostar Pen, Inject 15 Units into the skin every evening. for diabetes., Disp: 15 mL, Rfl: 0   Insulin  Pen Needle (TECHLITE PLUS PEN NEEDLES) 32G X 4 MM MISC, Use once daily with insulin ., Disp: 100 each, Rfl: 3   Lancets (ONETOUCH DELICA PLUS LANCET30G) MISC, USE TO TEST BLOOD SUGAR UP TO 4 TIMES A DAY, Disp: 300 each, Rfl: 5   Lifitegrast  (XIIDRA ) 5 % SOLN, Place 1 drop into both eyes 2 (two) times daily., Disp: 180 each, Rfl: 4   linaclotide  (LINZESS ) 145 MCG CAPS capsule, Take 1 capsule (145 mcg total) by mouth daily before  breakfast., Disp: 30 capsule, Rfl: 6   Magnesium 250 MG TABS, Take 250 mg by mouth daily., Disp: , Rfl:    metFORMIN  (GLUCOPHAGE -XR) 500 MG 24 hr tablet, Take 1 tablet (500 mg total) by mouth 2 (two) times daily for diabetes., Disp: 180 tablet, Rfl: 1   metoprolol  succinate (TOPROL -XL) 25 MG 24 hr tablet, Take 1 tablet (25 mg total) by mouth daily for heart rate., Disp: 90 tablet, Rfl: 2   nitroGLYCERIN  (NITROSTAT ) 0.4 MG SL tablet, Place 1 tablet (0.4 mg total) under the tongue every 5 (five) minutes as needed for  chest pain., Disp: 25 tablet, Rfl: 3   RSV vaccine recomb adjuvanted (AREXVY ) 120 MCG/0.5ML injection, Inject into the muscle., Disp: 1 each, Rfl: 0   sitaGLIPtin  (JANUVIA ) 100 MG tablet, Take 1 tablet (100 mg total) by mouth daily. for diabetes., Disp: 90 tablet, Rfl: 1   tobramycin  (TOBREX ) 0.3 % ophthalmic solution, , Disp: , Rfl:    tobramycin  (TOBREX ) 0.3 % ophthalmic solution, Place 1 drop into both eyes 4 (four) times daily beginning 1 day before procedure, the day of procedure, and the day after procedure. Continue as directed., Disp: 5 mL, Rfl: 5   zinc gluconate 50 MG tablet, Take 50 mg by mouth daily., Disp: , Rfl:  No Known Allergies Family History  Problem Relation Age of Onset   Cancer Mother    Diabetes Mother    Miscarriages / Stillbirths Mother    Hypertension Father    Heart attack Father    Cancer Sister    Diabetes Sister    Diabetes Brother    Heart attack Brother    Kidney disease Brother    Hyperlipidemia Brother    Colon cancer Neg Hx    Colon polyps Neg Hx    Esophageal cancer Neg Hx    Rectal cancer Neg Hx    Stomach cancer Neg Hx    Inflammatory bowel disease Neg Hx    Liver disease Neg Hx    Pancreatic cancer Neg Hx    Social History   Socioeconomic History   Marital status: Divorced    Spouse name: Not on file   Number of children: 3   Years of education: Not on file   Highest education level: 12th grade  Occupational History   Not on  file  Tobacco Use   Smoking status: Former    Current packs/day: 0.00    Types: Cigarettes    Start date: 02/17/1973    Quit date: 02/18/1975    Years since quitting: 48.8   Smokeless tobacco: Never  Vaping Use   Vaping status: Never Used  Substance and Sexual Activity   Alcohol use: Not Currently   Drug use: Never   Sexual activity: Not Currently    Birth control/protection: None  Other Topics Concern   Not on file  Social History Narrative   Not on file   Social Drivers of Health   Financial Resource Strain: Low Risk  (09/03/2023)   Overall Financial Resource Strain (CARDIA)    Difficulty of Paying Living Expenses: Not hard at all  Food Insecurity: No Food Insecurity (09/03/2023)   Hunger Vital Sign    Worried About Running Out of Food in the Last Year: Never true    Ran Out of Food in the Last Year: Never true  Transportation Needs: No Transportation Needs (09/03/2023)   PRAPARE - Administrator, Civil Service (Medical): No    Lack of Transportation (Non-Medical): No  Physical Activity: Insufficiently Active (09/03/2023)   Exercise Vital Sign    Days of Exercise per Week: 2 days    Minutes of Exercise per Session: 20 min  Stress: No Stress Concern Present (09/03/2023)   [employer] of Occupational Health - Occupational Stress Questionnaire    Feeling of Stress : Not at all  Social Connections: Socially Isolated (09/03/2023)   Social Connection and Isolation Panel [NHANES]    Frequency of Communication with Friends and Family: Once a week    Frequency of Social Gatherings with Friends and Family: Once a week  Attends Religious Services: Never    Active Member of Clubs or Organizations: No    Attends Banker Meetings: Never    Marital Status: Divorced  Catering manager Violence: Not At Risk (05/02/2023)   Humiliation, Afraid, Rape, and Kick questionnaire    Fear of Current or Ex-Partner: No    Emotionally Abused: No    Physically Abused: No     Sexually Abused: No    Physical Exam: There were no vitals filed for this visit. There is no height or weight on file to calculate BMI. GEN: NAD EYE: Sclerae anicteric ENT: MMM CV: Non-tachycardic GI: Soft, NT/ND NEURO:  Alert & Oriented x 3  Lab Results: No results for input(s): "WBC", "HGB", "HCT", "PLT" in the last 72 hours. BMET No results for input(s): "NA", "K", "CL", "CO2", "GLUCOSE", "BUN", "CREATININE", "CALCIUM " in the last 72 hours. LFT No results for input(s): "PROT", "ALBUMIN", "AST", "ALT", "ALKPHOS", "BILITOT", "BILIDIR", "IBILI" in the last 72 hours. PT/INR No results for input(s): "LABPROT", "INR" in the last 72 hours.   Impression / Plan: This is a 68 y.o.female who presents for EGD/Colonoscopy for issues of dysphagia and surveillance of previous advanced adenomas.  The risks and benefits of endoscopic evaluation/treatment were discussed with the patient and/or family; these include but are not limited to the risk of perforation, infection, bleeding, missed lesions, lack of diagnosis, severe illness requiring hospitalization, as well as anesthesia and sedation related illnesses.  The patient's history has been reviewed, patient examined, no change in status, and deemed stable for procedure.  The patient and/or family is agreeable to proceed.    Yong Henle, MD [facility]oscopy Office # [phone]

## 2023-12-20 NOTE — Progress Notes (Unsigned)
 0953 BP 180/107, Labetalol given IV, MD update, vss

## 2023-12-20 NOTE — Progress Notes (Unsigned)
1013 Ephedrine 10 mg given IV due to low BP, MD updated.

## 2023-12-20 NOTE — Progress Notes (Signed)
 Called to room to assist during endoscopic procedure.  Patient ID and intended procedure confirmed with present staff. Received instructions for my participation in the procedure from the performing physician.

## 2023-12-20 NOTE — Op Note (Signed)
 [facility] Patient Name: Alyssa Stone Procedure Date: 12/20/2023 9:50 AM MRN: [id number] Endoscopist: Yong Henle , MD, [id number] Age: 68 Referring MD:  Date of Birth: 28-May-1956 Gender: Female Account #: [id number] Procedure:                Upper GI endoscopy Indications:              Dysphagia, Globus sensation Medicines:                Monitored Anesthesia Care Procedure:                Pre-Anesthesia Assessment:                           - Prior to the procedure, a History and Physical                            was performed, and patient medications and                            allergies were reviewed. The patient's tolerance of                            previous anesthesia was also reviewed. The risks                            and benefits of the procedure and the sedation                            options and risks were discussed with the patient.                            All questions were answered, and informed consent                            was obtained. Prior Anticoagulants: The patient has                            taken Plavix  (clopidogrel ), last dose was 5 days                            prior to procedure. ASA Grade Assessment: III - A                            patient with severe systemic disease. After                            reviewing the risks and benefits, the patient was                            deemed in satisfactory condition to undergo the                            procedure.  After obtaining informed consent, the endoscope was                            passed under direct vision. Throughout the                            procedure, the patient's blood pressure, pulse, and                            oxygen saturations were monitored continuously. The                            Olympus Scope [id number] was introduced through the                            mouth, and advanced to the second part of  duodenum.                            The upper GI endoscopy was accomplished without                            difficulty. The patient tolerated the procedure. Scope In: Scope Out: Findings:                 No gross lesions were noted in the entire                            esophagus. Biopsies were taken with a cold forceps                            for histology. After the rest of the EGD was                            completed, a guidewire was placed and the scope was                            withdrawn. Dilation was performed with a Savary                            dilator with no resistance at 16 mm and mild                            resistance at 18 mm. The dilation site was examined                            following endoscope reinsertion and showed at the                            GEJxn a mild mucosal disruption, mild improvement                            in luminal narrowing and no perforation.  A non-obstructing Schatzki ring was found at the                            gastroesophageal junction. Disruption biopsies were                            taken with a cold forceps for histology.                           The Z-line was irregular and was found 39 cm from                            the incisors.                           A 1 cm hiatal hernia was present.                           Diffuse granular mucosa was found in the entire                            examined stomach. Biopsies were taken with a cold                            forceps for histology and Helicobacter pylori                            testing.                           No gross lesions were noted in the duodenal bulb,                            in the first portion of the duodenum and in the                            second portion of the duodenum. Complications:            No immediate complications. Estimated blood loss:                            None. Estimated  Blood Loss:     Estimated blood loss was minimal. Impression:               - No gross lesions in the entire esophagus.                            Biopsied. Dilated to 18 mm Savary with mucosal                            wrent at GEJxn.                           - Non-obstructing Schatzki ring. Disrupted.                           -  Z-line irregular, 39 cm from the incisors.                           - 1 cm hiatal hernia.                           - Granular gastric mucosa. Biopsied.                           - No gross lesions in the duodenal bulb, in the                            first portion of the duodenum and in the second                            portion of the duodenum. Recommendation:           - Proceed to scheduled colonoscopy.                           - Dilation diet as per protocol.                           - Please use Cepacol or Halls Lozenges +/-                            Chloraseptic spray for next 72-96 hours to aid in                            sore thoat should you experience this.                           - Initiate Omeprazole  20 mg twice daily for 1 month                            and then 20 mg daily for 1 month then may stop.                           - Plavix  restart timing on Colonoscopy report.                           - Await pathology results.                           - Repeat upper endoscopy PRN for retreatment, if                            dysphagia improves and then recurs. If no                            improvement consider manometry evaluation.                           - The findings and recommendations were discussed  with the patient.                           - The findings and recommendations were discussed                            with the patient's family. Yong Henle, MD 12/20/2023 10:40:09 AM

## 2023-12-20 NOTE — Progress Notes (Unsigned)
 Report given to PACU, vss

## 2023-12-23 ENCOUNTER — Other Ambulatory Visit: Payer: Self-pay

## 2023-12-23 ENCOUNTER — Telehealth: Payer: Self-pay

## 2023-12-23 ENCOUNTER — Other Ambulatory Visit: Payer: Self-pay | Admitting: Primary Care

## 2023-12-23 DIAGNOSIS — E1140 Type 2 diabetes mellitus with diabetic neuropathy, unspecified: Secondary | ICD-10-CM

## 2023-12-23 DIAGNOSIS — E113513 Type 2 diabetes mellitus with proliferative diabetic retinopathy with macular edema, bilateral: Secondary | ICD-10-CM

## 2023-12-23 MED FILL — Glipizide Tab 10 MG: ORAL | 90 days supply | Qty: 180 | Fill #0 | Status: AC

## 2023-12-23 MED FILL — Lancets: 75 days supply | Qty: 300 | Fill #0 | Status: AC

## 2023-12-23 NOTE — Telephone Encounter (Signed)
 error

## 2023-12-23 NOTE — Telephone Encounter (Signed)
  Follow up Call-     12/20/2023    9:32 AM  Call back number  Post procedure Call Back phone  # [phone]  Permission to leave phone message Yes     Patient questions:  Do you have a fever, pain , or abdominal swelling? No. Pain Score  0 *  Have you tolerated food without any problems? Yes.    Have you been able to return to your normal activities? Yes.    Do you have any questions about your discharge instructions: Diet   No. Medications  No. Follow up visit  No.  Do you have questions or concerns about your Care? No.  Actions: * If pain score is 4 or above: No action needed, pain <4.

## 2023-12-24 ENCOUNTER — Other Ambulatory Visit: Payer: Self-pay

## 2023-12-24 ENCOUNTER — Emergency Department

## 2023-12-24 ENCOUNTER — Inpatient Hospital Stay
Admission: EM | Admit: 2023-12-24 | Discharge: 2023-12-31 | DRG: 871 | Disposition: A | Discharge status: Home Health | Attending: Internal Medicine | Admitting: Internal Medicine

## 2023-12-24 DIAGNOSIS — Z794 Long term (current) use of insulin: Secondary | ICD-10-CM | POA: Diagnosis not present

## 2023-12-24 DIAGNOSIS — Z83438 Family history of other disorder of lipoprotein metabolism and other lipidemia: Secondary | ICD-10-CM

## 2023-12-24 DIAGNOSIS — R6521 Severe sepsis with septic shock: Secondary | ICD-10-CM | POA: Diagnosis present

## 2023-12-24 DIAGNOSIS — Z6837 Body mass index (BMI) 37.0-37.9, adult: Secondary | ICD-10-CM

## 2023-12-24 DIAGNOSIS — I2510 Atherosclerotic heart disease of native coronary artery without angina pectoris: Secondary | ICD-10-CM | POA: Insufficient documentation

## 2023-12-24 DIAGNOSIS — R7401 Elevation of levels of liver transaminase levels: Secondary | ICD-10-CM | POA: Diagnosis present

## 2023-12-24 DIAGNOSIS — Z87891 Personal history of nicotine dependence: Secondary | ICD-10-CM

## 2023-12-24 DIAGNOSIS — E66812 Obesity, class 2: Secondary | ICD-10-CM | POA: Diagnosis present

## 2023-12-24 DIAGNOSIS — Z79899 Other long term (current) drug therapy: Secondary | ICD-10-CM

## 2023-12-24 DIAGNOSIS — E871 Hypo-osmolality and hyponatremia: Secondary | ICD-10-CM | POA: Diagnosis present

## 2023-12-24 DIAGNOSIS — Z7984 Long term (current) use of oral hypoglycemic drugs: Secondary | ICD-10-CM

## 2023-12-24 DIAGNOSIS — E876 Hypokalemia: Secondary | ICD-10-CM | POA: Insufficient documentation

## 2023-12-24 DIAGNOSIS — E869 Volume depletion, unspecified: Secondary | ICD-10-CM | POA: Diagnosis present

## 2023-12-24 DIAGNOSIS — N179 Acute kidney failure, unspecified: Secondary | ICD-10-CM | POA: Diagnosis present

## 2023-12-24 DIAGNOSIS — Z8249 Family history of ischemic heart disease and other diseases of the circulatory system: Secondary | ICD-10-CM

## 2023-12-24 DIAGNOSIS — E1165 Type 2 diabetes mellitus with hyperglycemia: Secondary | ICD-10-CM

## 2023-12-24 DIAGNOSIS — E785 Hyperlipidemia, unspecified: Secondary | ICD-10-CM | POA: Diagnosis present

## 2023-12-24 DIAGNOSIS — L0390 Cellulitis, unspecified: Secondary | ICD-10-CM | POA: Diagnosis not present

## 2023-12-24 DIAGNOSIS — Z7902 Long term (current) use of antithrombotics/antiplatelets: Secondary | ICD-10-CM

## 2023-12-24 DIAGNOSIS — E872 Acidosis, unspecified: Secondary | ICD-10-CM | POA: Diagnosis present

## 2023-12-24 DIAGNOSIS — R234 Changes in skin texture: Secondary | ICD-10-CM | POA: Diagnosis present

## 2023-12-24 DIAGNOSIS — R112 Nausea with vomiting, unspecified: Secondary | ICD-10-CM | POA: Diagnosis present

## 2023-12-24 DIAGNOSIS — I10 Essential (primary) hypertension: Secondary | ICD-10-CM | POA: Diagnosis present

## 2023-12-24 DIAGNOSIS — D649 Anemia, unspecified: Secondary | ICD-10-CM | POA: Diagnosis present

## 2023-12-24 DIAGNOSIS — B001 Herpesviral vesicular dermatitis: Secondary | ICD-10-CM | POA: Diagnosis not present

## 2023-12-24 DIAGNOSIS — K219 Gastro-esophageal reflux disease without esophagitis: Secondary | ICD-10-CM | POA: Diagnosis not present

## 2023-12-24 DIAGNOSIS — I444 Left anterior fascicular block: Secondary | ICD-10-CM | POA: Diagnosis present

## 2023-12-24 DIAGNOSIS — E1140 Type 2 diabetes mellitus with diabetic neuropathy, unspecified: Secondary | ICD-10-CM

## 2023-12-24 DIAGNOSIS — F32A Depression, unspecified: Secondary | ICD-10-CM | POA: Diagnosis present

## 2023-12-24 DIAGNOSIS — Z860100 Personal history of colon polyps, unspecified: Secondary | ICD-10-CM

## 2023-12-24 DIAGNOSIS — Z1152 Encounter for screening for COVID-19: Secondary | ICD-10-CM | POA: Diagnosis not present

## 2023-12-24 DIAGNOSIS — Z833 Family history of diabetes mellitus: Secondary | ICD-10-CM

## 2023-12-24 DIAGNOSIS — Z841 Family history of disorders of kidney and ureter: Secondary | ICD-10-CM

## 2023-12-24 DIAGNOSIS — R531 Weakness: Secondary | ICD-10-CM

## 2023-12-24 DIAGNOSIS — A419 Sepsis, unspecified organism: Principal | ICD-10-CM | POA: Diagnosis present

## 2023-12-24 DIAGNOSIS — R1110 Vomiting, unspecified: Secondary | ICD-10-CM | POA: Insufficient documentation

## 2023-12-24 DIAGNOSIS — Z955 Presence of coronary angioplasty implant and graft: Secondary | ICD-10-CM

## 2023-12-24 DIAGNOSIS — L03116 Cellulitis of left lower limb: Principal | ICD-10-CM

## 2023-12-24 DIAGNOSIS — E119 Type 2 diabetes mellitus without complications: Secondary | ICD-10-CM | POA: Diagnosis not present

## 2023-12-24 DIAGNOSIS — R6520 Severe sepsis without septic shock: Secondary | ICD-10-CM

## 2023-12-24 HISTORY — DX: Severe sepsis with septic shock: A41.9

## 2023-12-24 LAB — URINALYSIS, W/ REFLEX TO CULTURE (INFECTION SUSPECTED)
Bilirubin Urine: NEGATIVE
Glucose, UA: NEGATIVE mg/dL
Hgb urine dipstick: NEGATIVE
Ketones, ur: NEGATIVE mg/dL
Nitrite: NEGATIVE
Protein, ur: 30 mg/dL — AB
Specific Gravity, Urine: >1.046 — ABNORMAL HIGH (ref 1.005–1.030)
pH: 5 (ref 5.0–8.0)

## 2023-12-24 LAB — CBC WITH DIFFERENTIAL/PLATELET
Abs Immature Granulocytes: 0 10*3/uL (ref 0.00–0.07)
Band Neutrophils: 7 %
Basophils Absolute: 0 10*3/uL (ref 0.0–0.1)
Basophils Relative: 0 %
Eosinophils Absolute: 0 10*3/uL (ref 0.0–0.5)
Eosinophils Relative: 0 %
HCT: 37.7 % (ref 36.0–46.0)
Hemoglobin: 12.7 g/dL (ref 12.0–15.0)
Lymphocytes Relative: 6 %
Lymphs Abs: 1.4 10*3/uL (ref 0.7–4.0)
MCH: 29.5 pg (ref 26.0–34.0)
MCHC: 33.7 g/dL (ref 30.0–36.0)
MCV: 87.7 fL (ref 80.0–100.0)
Monocytes Absolute: 1.4 10*3/uL — ABNORMAL HIGH (ref 0.1–1.0)
Monocytes Relative: 6 %
Neutro Abs: 19.8 10*3/uL — ABNORMAL HIGH (ref 1.7–7.7)
Neutrophils Relative %: 81 %
Platelets: 267 10*3/uL (ref 150–400)
RBC: 4.3 MIL/uL (ref 3.87–5.11)
RDW: 13.3 % (ref 11.5–15.5)
WBC: 22.5 10*3/uL — ABNORMAL HIGH (ref 4.0–10.5)
nRBC: 0 % (ref 0.0–0.2)

## 2023-12-24 LAB — COMPREHENSIVE METABOLIC PANEL WITH GFR
ALT: 36 U/L (ref 0–44)
AST: 108 U/L — ABNORMAL HIGH (ref 15–41)
Albumin: 3 g/dL — ABNORMAL LOW (ref 3.5–5.0)
Alkaline Phosphatase: 57 U/L (ref 38–126)
Anion gap: 13 (ref 5–15)
BUN: 24 mg/dL — ABNORMAL HIGH (ref 8–23)
CO2: 21 mmol/L — ABNORMAL LOW (ref 22–32)
Calcium: 8.5 mg/dL — ABNORMAL LOW (ref 8.9–10.3)
Chloride: 100 mmol/L (ref 98–111)
Creatinine, Ser: 1.23 mg/dL — ABNORMAL HIGH (ref 0.44–1.00)
GFR, Estimated: 48 mL/min — ABNORMAL LOW (ref >60)
Glucose, Bld: 271 mg/dL — ABNORMAL HIGH (ref 70–99)
Potassium: 3.3 mmol/L — ABNORMAL LOW (ref 3.5–5.1)
Sodium: 134 mmol/L — ABNORMAL LOW (ref 135–145)
Total Bilirubin: 1.2 mg/dL (ref 0.0–1.2)
Total Protein: 6.8 g/dL (ref 6.5–8.1)

## 2023-12-24 LAB — PROTIME-INR
INR: 1.5 — ABNORMAL HIGH (ref 0.8–1.2)
Prothrombin Time: 18.2 s — ABNORMAL HIGH (ref 11.4–15.2)

## 2023-12-24 LAB — RESP PANEL BY RT-PCR (RSV, FLU A&B, COVID)  RVPGX2
Influenza A by PCR: NEGATIVE
Influenza B by PCR: NEGATIVE
Resp Syncytial Virus by PCR: NEGATIVE
SARS Coronavirus 2 by RT PCR: NEGATIVE

## 2023-12-24 LAB — LACTIC ACID, PLASMA
Lactic Acid, Venous: 2.1 mmol/L (ref 0.5–1.9)
Lactic Acid, Venous: 0.9 mmol/L (ref 0.5–1.9)

## 2023-12-24 MED ORDER — SODIUM CHLORIDE 0.9 % IV BOLUS
1000.0000 mL | Freq: Once | INTRAVENOUS | Status: AC
  Administered 2023-12-24: 1000 mL via INTRAVENOUS

## 2023-12-24 MED ORDER — VANCOMYCIN HCL 2000 MG/400ML IV SOLN
2000.0000 mg | Freq: Once | INTRAVENOUS | Status: AC
  Administered 2023-12-24: 2000 mg via INTRAVENOUS
  Filled 2023-12-24: qty 400

## 2023-12-24 MED ORDER — MAGNESIUM HYDROXIDE 400 MG/5ML PO SUSP: 30.0000 mL | Freq: Every day | ORAL | Status: DC | PRN

## 2023-12-24 MED ORDER — INSULIN GLARGINE-YFGN 100 UNIT/ML ~~LOC~~ SOLN
15.0000 [IU] | Freq: Every day | SUBCUTANEOUS | Status: DC
  Filled 2023-12-24: qty 0.15

## 2023-12-24 MED ORDER — VITAMIN B-12 1000 MCG PO TABS
5000.0000 ug | ORAL_TABLET | Freq: Every day | ORAL | Status: DC
  Administered 2023-12-26 – 2023-12-31 (×6): 5000 ug via ORAL
  Filled 2023-12-24 (×6): qty 5

## 2023-12-24 MED ORDER — ONDANSETRON HCL 4 MG PO TABS: 4.0000 mg | ORAL_TABLET | Freq: Four times a day (QID) | ORAL | Status: DC | PRN

## 2023-12-24 MED ORDER — VANCOMYCIN VARIABLE DOSE PER UNSTABLE RENAL FUNCTION (PHARMACIST DOSING): Status: DC

## 2023-12-24 MED ORDER — ATORVASTATIN CALCIUM 20 MG PO TABS
40.0000 mg | ORAL_TABLET | Freq: Every evening | ORAL | Status: DC
  Administered 2023-12-26 – 2023-12-30 (×5): 40 mg via ORAL
  Filled 2023-12-24 (×5): qty 2

## 2023-12-24 MED ORDER — LIFITEGRAST 5 % OP SOLN
1.0000 [drp] | Freq: Two times a day (BID) | OPHTHALMIC | Status: DC
Start: 2023-12-24 — End: 2023-12-25

## 2023-12-24 MED ORDER — VANCOMYCIN HCL IN DEXTROSE 1-5 GM/200ML-% IV SOLN: 1000.0000 mg | Freq: Once | INTRAVENOUS | Status: DC

## 2023-12-24 MED ORDER — NITROGLYCERIN 0.4 MG SL SUBL: 0.4000 mg | SUBLINGUAL_TABLET | SUBLINGUAL | Status: DC | PRN

## 2023-12-24 MED ORDER — IOHEXOL 300 MG/ML  SOLN
100.0000 mL | Freq: Once | INTRAMUSCULAR | Status: AC | PRN
  Administered 2023-12-24: 100 mL via INTRAVENOUS

## 2023-12-24 MED ORDER — MAGNESIUM OXIDE -MG SUPPLEMENT 400 (240 MG) MG PO TABS: 200.0000 mg | ORAL_TABLET | Freq: Every day | ORAL | Status: DC

## 2023-12-24 MED ORDER — LINACLOTIDE 145 MCG PO CAPS
145.0000 ug | ORAL_CAPSULE | Freq: Every day | ORAL | Status: DC
  Administered 2023-12-25 – 2023-12-27 (×2): 145 ug via ORAL
  Filled 2023-12-24 (×9): qty 1

## 2023-12-24 MED ORDER — LACTATED RINGERS IV SOLN
150.0000 mL/h | INTRAVENOUS | Status: DC
  Administered 2023-12-24 – 2023-12-25 (×3): 150 mL/h via INTRAVENOUS

## 2023-12-24 MED ORDER — TRAZODONE HCL 50 MG PO TABS: 25.0000 mg | ORAL_TABLET | Freq: Every evening | ORAL | Status: DC | PRN

## 2023-12-24 MED ORDER — POTASSIUM CHLORIDE 10 MEQ/100ML IV SOLN
10.0000 meq | INTRAVENOUS | Status: AC
  Administered 2023-12-25 (×4): 10 meq via INTRAVENOUS
  Filled 2023-12-24 (×4): qty 100

## 2023-12-24 MED ORDER — ENOXAPARIN SODIUM 40 MG/0.4ML IJ SOSY: 40.0000 mg | PREFILLED_SYRINGE | INTRAMUSCULAR | Status: DC

## 2023-12-24 MED ORDER — ENOXAPARIN SODIUM 60 MG/0.6ML IJ SOSY
0.5000 mg/kg | PREFILLED_SYRINGE | INTRAMUSCULAR | Status: DC
Start: 2023-12-24 — End: 2023-12-31
  Administered 2023-12-25 – 2023-12-30 (×7): 50 mg via SUBCUTANEOUS
  Filled 2023-12-24 (×7): qty 0.6

## 2023-12-24 MED ORDER — POLYETHYLENE GLYCOL 3350 17 G PO PACK: 17.0000 g | PACK | Freq: Every day | ORAL | Status: DC | PRN

## 2023-12-24 MED ORDER — CHLORHEXIDINE GLUCONATE CLOTH 2 % EX PADS
6.0000 | MEDICATED_PAD | Freq: Every day | CUTANEOUS | Status: DC
  Administered 2023-12-25 – 2023-12-28 (×3): 6 via TOPICAL

## 2023-12-24 MED ORDER — ACETAMINOPHEN 500 MG PO TABS
1000.0000 mg | ORAL_TABLET | Freq: Once | ORAL | Status: AC
  Administered 2023-12-24: 1000 mg via ORAL
  Filled 2023-12-24: qty 2

## 2023-12-24 MED ORDER — VITAMIN C 500 MG PO TABS
1000.0000 mg | ORAL_TABLET | Freq: Every day | ORAL | Status: DC
  Administered 2023-12-26 – 2023-12-31 (×6): 1000 mg via ORAL
  Filled 2023-12-24 (×6): qty 2

## 2023-12-24 MED ORDER — DOCUSATE SODIUM 100 MG PO CAPS: 100.0000 mg | ORAL_CAPSULE | Freq: Two times a day (BID) | ORAL | Status: DC | PRN

## 2023-12-24 MED ORDER — SODIUM CHLORIDE 0.9 % IV SOLN
2.0000 g | Freq: Three times a day (TID) | INTRAVENOUS | Status: DC
  Administered 2023-12-25: 2 g via INTRAVENOUS
  Filled 2023-12-24 (×2): qty 12.5

## 2023-12-24 MED ORDER — PANTOPRAZOLE SODIUM 40 MG PO TBEC: 40.0000 mg | DELAYED_RELEASE_TABLET | Freq: Every day | ORAL | Status: DC

## 2023-12-24 MED ORDER — SODIUM CHLORIDE 0.9 % IV SOLN: 2.0000 g | Freq: Once | INTRAVENOUS | Status: DC

## 2023-12-24 MED ORDER — ONDANSETRON HCL 4 MG/2ML IJ SOLN
4.0000 mg | Freq: Four times a day (QID) | INTRAMUSCULAR | Status: DC | PRN
  Administered 2023-12-25 – 2023-12-31 (×6): 4 mg via INTRAVENOUS
  Filled 2023-12-24 (×7): qty 2

## 2023-12-24 MED ORDER — NOREPINEPHRINE 4 MG/250ML-% IV SOLN
0.0000 ug/min | INTRAVENOUS | Status: DC
  Administered 2023-12-24: 2 ug/min via INTRAVENOUS
  Filled 2023-12-24: qty 250

## 2023-12-24 MED ORDER — PIPERACILLIN-TAZOBACTAM 3.375 G IVPB
3.3750 g | Freq: Once | INTRAVENOUS | Status: AC
  Administered 2023-12-24: 3.375 g via INTRAVENOUS
  Filled 2023-12-24: qty 50

## 2023-12-24 MED ORDER — SODIUM CHLORIDE 0.9 % IV SOLN: 2.0000 g | Freq: Two times a day (BID) | INTRAVENOUS | Status: DC

## 2023-12-24 MED ORDER — INSULIN ASPART 100 UNIT/ML IJ SOLN
0.0000 [IU] | INTRAMUSCULAR | Status: DC
  Administered 2023-12-25: 8 [IU] via SUBCUTANEOUS
  Filled 2023-12-24: qty 1

## 2023-12-24 MED ORDER — SODIUM CHLORIDE 0.9 % IV BOLUS
1000.0000 mL | Freq: Once | INTRAVENOUS | Status: AC
Start: 2023-12-24 — End: 2023-12-24
  Administered 2023-12-24: 1000 mL via INTRAVENOUS

## 2023-12-24 MED ORDER — CLOPIDOGREL BISULFATE 75 MG PO TABS
75.0000 mg | ORAL_TABLET | Freq: Every day | ORAL | Status: DC
  Administered 2023-12-25 – 2023-12-31 (×7): 75 mg via ORAL
  Filled 2023-12-24 (×8): qty 1

## 2023-12-24 MED ORDER — SODIUM CHLORIDE 0.9 % IV SOLN
250.0000 mL | INTRAVENOUS | Status: AC
  Administered 2023-12-25: 250 mL via INTRAVENOUS

## 2023-12-24 MED ORDER — LINAGLIPTIN 5 MG PO TABS: 5.0000 mg | ORAL_TABLET | Freq: Every day | ORAL | Status: DC

## 2023-12-24 MED ORDER — TOBRAMYCIN 0.3 % OP SOLN
1.0000 [drp] | Freq: Four times a day (QID) | OPHTHALMIC | Status: DC
  Filled 2023-12-24: qty 5

## 2023-12-24 MED ORDER — ZINC SULFATE 220 (50 ZN) MG PO CAPS
220.0000 mg | ORAL_CAPSULE | Freq: Every day | ORAL | Status: DC
  Administered 2023-12-26 – 2023-12-31 (×6): 220 mg via ORAL
  Filled 2023-12-24 (×6): qty 1

## 2023-12-24 MED ORDER — METOPROLOL SUCCINATE ER 25 MG PO TB24: 25.0000 mg | ORAL_TABLET | Freq: Every day | ORAL | Status: DC

## 2023-12-24 MED ORDER — GLIPIZIDE 10 MG PO TABS: 10.0000 mg | ORAL_TABLET | Freq: Two times a day (BID) | ORAL | Status: DC

## 2023-12-24 NOTE — Consult Note (Signed)
 Pharmacy Antibiotic Note  Alyssa Stone is a 68 y.o. female admitted on 12/24/2023 with cellulitis.  Pharmacy has been consulted for cefepime and vancomycin dosing.  Plan: Pt received vancomycin 2000 mg x 1 loading dose in the ED. Will dose by levels. Scr is elevated.   Start cefepime 2 g q12H.   Height: 5\' 7"  (170.2 cm) Weight: 99.8 kg (220 lb) IBW/kg (Calculated) : 61.6  Temp (24hrs), Avg:101.1 F (38.4 C), Min:100.3 F (37.9 C), Max:101.9 F (38.8 C)  Recent Labs  Lab 12/24/23 1805  WBC 22.5*  CREATININE 1.23*  LATICACIDVEN 2.1*    Estimated Creatinine Clearance: 52.4 mL/min (A) (by C-G formula based on SCr of 1.23 mg/dL (H)).    No Known Allergies  Antimicrobials this admission: 5/6 cefepime >>  5/6 vancomycin >>   Dose adjustments this admission: None  Microbiology results: 5/6 BCx: pending  Thank you for allowing pharmacy to be a part of this patient's care.  Trinidad Funk, PharmD, BCPS 12/24/2023 8:36 PM

## 2023-12-24 NOTE — ED Notes (Signed)
 Hospitalist at bedside

## 2023-12-24 NOTE — Progress Notes (Signed)
 PHARMACIST - PHYSICIAN COMMUNICATION  CONCERNING:  Enoxaparin (Lovenox) for DVT Prophylaxis    RECOMMENDATION: Patient was prescribed enoxaprin 40mg  q24 hours for VTE prophylaxis.   Filed Weights   12/24/23 1750  Weight: 99.8 kg (220 lb)    Body mass index is 34.46 kg/m.  Estimated Creatinine Clearance: 52.4 mL/min (A) (by C-G formula based on SCr of 1.23 mg/dL (H)).   Based on [facility] policy patient is candidate for enoxaparin 0.5mg /kg TBW SQ every 24 hours based on BMI being >30.   DESCRIPTION: Pharmacy has adjusted enoxaparin dose per [facility] policy.  Patient is now receiving enoxaparin 50 mg every 24 hours   Malone Sear, PharmD, BCPS Clinical Pharmacist 12/24/2023 8:39 PM

## 2023-12-24 NOTE — ED Notes (Signed)
 Hospitalist Mansy Aware of pt BP; NS bolus ordered and given; will continue to monitor

## 2023-12-24 NOTE — ED Triage Notes (Addendum)
 Pt to ED via [facility] from home c/o n/v that started one day ago, on EMS arrival Pt Lethargic , initial HR 130, SEPSIS ACTIVATION.   Per EMS pt orientation improving      #18 LAC:  LR given by EMS. Zofran   Last VS: CBG 256, 95%RA, 116, 111/56, 102.5   Of note pt had colonoscopy 4 days ago

## 2023-12-24 NOTE — H&P (Incomplete)
 [place]   PATIENT NAME: Alyssa Stone    MR#:  [id number]  DATE OF BIRTH:  Oct 13, 1955  DATE OF ADMISSION:  12/24/2023  PRIMARY CARE PHYSICIAN: Gabriel John, NP   Patient is coming from: Home  REQUESTING/REFERRING PHYSICIAN: Bunny Caroli, MD  CHIEF COMPLAINT:   Chief Complaint  Patient presents with   Code Sepsis    HISTORY OF PRESENT ILLNESS:  Alyssa Stone is a 68 y.o. Caucasian female with medical history significant for coronary artery disease, type 2 diabetes mellitus, GERD, dyslipidemia, hypertension, presented to the emergency room with acute onset of vomiting for the last day with associated feeling sick.  She admitted to having fever last week.  She has been having left lower extremity erythema with swelling, warmth, tenderness and pain.  She denied any dysuria, oliguria or hematuria or flank pain.  No chest pain or palpitations.  No cough or wheezing or hemoptysis.  ED Course: When she came to the ER BP was 102/42 with a heart rate of 114 with otherwise normal vital signs.  Labs revealed mild hyponatremia and high pro kalemia, hyperglycemia of 271 BUN of 24 creatinine 1.23, calcium  8.5 and albumin 3 AST 108.  Lactic acid was 2.1 and later 0.9.  CBC showed leukocytosis 22.5 with neutrophilia.  Respiratory panel came back negative.  Blood culture was drawn. EKG as reviewed by me :  EKG showed sinus tachycardia with a rate of 116 with premature atrial complexes left anterior fascicular block with minimal voltage criteria for LVH . Imaging: Portable chest x-ray showed no acute cardiopulmonary disease.  Left hip/fib x-ray was negative. Abdominal pelvic CT scan with contrast revealed the following: 1. Enlarged left external iliac chain and inguinal lymph nodes with surrounding inflammatory stranding. Findings are nonspecific and may be reactive. Follow-up recommended to ensure resolution. 2. Small hiatal hernia with wall thickening of the distal  esophagus worrisome for esophagitis. 3. Indeterminate left adrenal lesion measuring 11 mm. Recommend further evaluation with adrenal protocol CT or MRI. 4. Fibroid uterus. 5. Sigmoid colon diverticulosis.  The patient was given 1 g p.o. Tylenol , IV vancomycin and Zosyn and 2 L bolus of IV normal saline.  Blood pressure remained low and she was given further bolus of IV normal saline and despite that she remained hypotensive.  IV Levophed was started and she will be admitted to an ICU bed. PAST MEDICAL HISTORY:   Past Medical History:  Diagnosis Date   Allergy    Anemia    CAD (coronary artery disease)    s/p DES to o-pLAD in 09/2020 // dLAD 75 (small, med Rx)   Cataract    Chicken pox    Depression    Diabetes mellitus    Fainting episodes    Gastroesophageal reflux disease    Hyperlipidemia    controlled om meds    Sinus tachycardia     PAST SURGICAL HISTORY:   Past Surgical History:  Procedure Laterality Date   ANAL FISSURE REPAIR     CATARACT EXTRACTION Bilateral 08/20/2022   CESAREAN SECTION     CORONARY IMAGING/OCT N/A 10/14/2020   Procedure: INTRAVASCULAR IMAGING/OCT;  Surgeon: Lucendia Rusk, MD;  Location: [facility] INVASIVE CV LAB;  Service: Cardiovascular;  Laterality: N/A;   CORONARY PRESSURE/FFR STUDY N/A 10/14/2020   Procedure: INTRAVASCULAR PRESSURE WIRE/FFR STUDY;  Surgeon: Lucendia Rusk, MD;  Location: [facility] INVASIVE CV LAB;  Service: Cardiovascular;  Laterality: N/A;   CORONARY STENT INTERVENTION N/A 10/14/2020  Procedure: CORONARY STENT INTERVENTION;  Surgeon: Lucendia Rusk, MD;  Location: [facility] INVASIVE CV LAB;  Service: Cardiovascular;  Laterality: N/A;   EYE SURGERY     LEFT HEART CATH AND CORONARY ANGIOGRAPHY N/A 10/14/2020   Procedure: LEFT HEART CATH AND CORONARY ANGIOGRAPHY;  Surgeon: Lucendia Rusk, MD;  Location: [facility] INVASIVE CV LAB;  Service: Cardiovascular;  Laterality: N/A;   TONSILLECTOMY Bilateral    TUBAL LIGATION      SOCIAL  HISTORY:   Social History   Tobacco Use   Smoking status: Former    Current packs/day: 0.00    Types: Cigarettes    Start date: 02/17/1973    Quit date: 02/18/1975    Years since quitting: 48.8   Smokeless tobacco: Never  Substance Use Topics   Alcohol use: Not Currently    FAMILY HISTORY:   Family History  Problem Relation Age of Onset   Cancer Mother    Diabetes Mother    Miscarriages / Stillbirths Mother    Hypertension Father    Heart attack Father    Cancer Sister    Diabetes Sister    Diabetes Brother    Heart attack Brother    Kidney disease Brother    Hyperlipidemia Brother    Colon cancer Neg Hx    Colon polyps Neg Hx    Esophageal cancer Neg Hx    Rectal cancer Neg Hx    Stomach cancer Neg Hx    Inflammatory bowel disease Neg Hx    Liver disease Neg Hx    Pancreatic cancer Neg Hx     DRUG ALLERGIES:  No Known Allergies  REVIEW OF SYSTEMS:   ROS As per history of present illness. All pertinent systems were reviewed above. Constitutional, HEENT, cardiovascular, respiratory, GI, GU, musculoskeletal, neuro, psychiatric, endocrine, integumentary and hematologic systems were reviewed and are otherwise negative/unremarkable except for positive findings mentioned above in the HPI.   MEDICATIONS AT HOME:   Prior to Admission medications   Medication Sig Start Date End Date Taking? Authorizing Provider  Ascorbic Acid (VITAMIN C) 1000 MG tablet Take 1,000 mg by mouth daily.    [provider]  atorvastatin  (LIPITOR) 40 MG tablet Take 1 tablet (40 mg total) by mouth every evening for cholestrol. 05/28/23   Clark, Katherine K, NP  clobetasol  (TEMOVATE ) 0.05 % external solution Apply 1 application topically to dry scalp once daily as needed for flares. Patient not taking: Reported on 12/20/2023 06/06/23     clopidogrel  (PLAVIX ) 75 MG tablet Take 1 tablet (75 mg total) by mouth daily. 10/23/23   Nahser, Lela Purple, MD  Continuous Glucose Sensor (FREESTYLE LIBRE 3  PLUS SENSOR) MISC Use to check blood sugar continuously. Change sensor every 15 days. 09/05/23   Clark, Katherine K, NP  Cyanocobalamin  (VITAMIN B-12) 5000 MCG SUBL Place 5,000 mcg under the tongue daily.    [provider]  glipiZIDE  (GLUCOTROL ) 10 MG tablet Take 1 tablet (10 mg total) by mouth 2 (two) times daily before a meal for diabetes. 12/23/23   Clark, Katherine K, NP  glucose blood (ONETOUCH VERIO) test strip use to test blood sugar up to 4 (four) times daily. 02/07/23   Gabriel John, NP  insulin  glargine (LANTUS ) 100 UNIT/ML Solostar Pen Inject 15 Units into the skin every evening. for diabetes. 12/11/23   Clark, Katherine K, NP  Insulin  Pen Needle (TECHLITE PLUS PEN NEEDLES) 32G X 4 MM MISC Use once daily with insulin . 07/10/23   Tretha Fu  K, NP  Lancets (ONETOUCH DELICA PLUS LANCET33G) MISC USE TO TEST BLOOD SUGAR UP TO 4 TIMES A DAY 12/23/23   Gabriel John, NP  Lifitegrast  (XIIDRA ) 5 % SOLN Place 1 drop into both eyes 2 (two) times daily. 10/29/22     linaclotide  (LINZESS ) 145 MCG CAPS capsule Take 1 capsule (145 mcg total) by mouth daily before breakfast. 09/03/23   Mansouraty, Albino Alu., MD  Magnesium 250 MG TABS Take 250 mg by mouth daily.    [provider]  metFORMIN  (GLUCOPHAGE -XR) 500 MG 24 hr tablet Take 1 tablet (500 mg total) by mouth 2 (two) times daily for diabetes. 10/16/23   Clark, Katherine K, NP  metoprolol  succinate (TOPROL -XL) 25 MG 24 hr tablet Take 1 tablet (25 mg total) by mouth daily for heart rate. 05/28/23   Clark, Katherine K, NP  nitroGLYCERIN  (NITROSTAT ) 0.4 MG SL tablet Place 1 tablet (0.4 mg total) under the tongue every 5 (five) minutes as needed for chest pain. 09/13/23   Nahser, Lela Purple, MD  omeprazole  (PRILOSEC) 20 MG capsule Take 1 capsule (20 mg total) by mouth 2 (two) times daily before a meal for 30 days, THEN 1 capsule (20 mg total) daily, THEN stop. 12/20/23 02/18/24  Mansouraty, Albino Alu., MD  RSV vaccine recomb adjuvanted  (AREXVY ) 120 MCG/0.5ML injection Inject into the muscle. 04/27/22   Liane Redman, MD  sitaGLIPtin  (JANUVIA ) 100 MG tablet Take 1 tablet (100 mg total) by mouth daily. for diabetes. 09/05/23   Clark, Katherine K, NP  tobramycin  (TOBREX ) 0.3 % ophthalmic solution  06/14/21   [provider]  tobramycin  (TOBREX ) 0.3 % ophthalmic solution Place 1 drop into both eyes 4 (four) times daily beginning 1 day before procedure, the day of procedure, and the day after procedure. Continue as directed. 03/13/23     zinc gluconate 50 MG tablet Take 50 mg by mouth daily.    [provider]      VITAL SIGNS:  Blood pressure (!) 140/60, pulse 100, temperature 98.6 F (37 C), temperature source Axillary, resp. rate (!) 21, height 5\' 7"  (1.702 m), weight 100.1 kg, SpO2 98%.  PHYSICAL EXAMINATION:  Physical Exam  GENERAL:  68 y.o.-year-old Caucasian female patient lying in the bed with no acute distress.  EYES: Pupils equal, round, reactive to light and accommodation. No scleral icterus. Extraocular muscles intact.  HEENT: Head atraumatic, normocephalic. Oropharynx and nasopharynx clear.  NECK:  Supple, no jugular venous distention. No thyroid  enlargement, no tenderness.  LUNGS: Normal breath sounds bilaterally, no wheezing, rales,rhonchi or crepitation. No use of accessory muscles of respiration.  CARDIOVASCULAR: Regular rate and rhythm, S1, S2 normal. No murmurs, rubs, or gallops.  ABDOMEN: Soft, nondistended, nontender. Bowel sounds present. No organomegaly or mass.  EXTREMITIES: No pedal edema, cyanosis, or clubbing.  NEUROLOGIC: Cranial nerves II through XII are intact. Muscle strength 5/5 in all extremities. Sensation intact. Gait not checked.  PSYCHIATRIC: The patient is alert and oriented x 3.  Normal affect and good eye contact. SKIN: Left lower extremity erythema, swelling with warmth and tenderness with mild demarcation near her knee.  She has a cracked callus at her heel as shown  below.     LABORATORY PANEL:   CBC Recent Labs  Lab 12/24/23 1805  WBC 22.5*  HGB 12.7  HCT 37.7  PLT 267   ------------------------------------------------------------------------------------------------------------------  Chemistries  Recent Labs  Lab 12/24/23 1805 12/25/23 0143  NA 134*  --   K 3.3*  --  CL 100  --   CO2 21*  --   GLUCOSE 271*  --   BUN 24*  --   CREATININE 1.23*  --   CALCIUM  8.5*  --   MG  --  1.6*  AST 108*  --   ALT 36  --   ALKPHOS 57  --   BILITOT 1.2  --    ------------------------------------------------------------------------------------------------------------------  Cardiac Enzymes No results for input(s): "TROPONINI" in the last 168 hours. ------------------------------------------------------------------------------------------------------------------  RADIOLOGY:  US  Venous Img Lower Unilateral Left (DVT) Result Date: 12/25/2023 CLINICAL DATA:  Redness and swelling in the left lower extremity EXAM: Left LOWER EXTREMITY VENOUS DOPPLER ULTRASOUND TECHNIQUE: Gray-scale sonography with compression, as well as color and duplex ultrasound, were performed to evaluate the deep venous system(s) from the level of the common femoral vein through the popliteal and proximal calf veins. COMPARISON:  None Available. FINDINGS: VENOUS Normal compressibility of the common femoral, superficial femoral, and popliteal veins, as well as the visualized calf veins. Visualized portions of profunda femoral vein and great saphenous vein unremarkable. No filling defects to suggest DVT on grayscale or color Doppler imaging. Doppler waveforms show normal direction of venous flow, normal respiratory plasticity and response to augmentation. Limited views of the contralateral common femoral vein are unremarkable. OTHER None. Limitations: none IMPRESSION: Negative. Electronically Signed   By: Rozell Cornet M.D.   On: 12/25/2023 02:28   CT ABDOMEN PELVIS W  CONTRAST Result Date: 12/24/2023 CLINICAL DATA:  Sepsis with nausea and vomiting. EXAM: CT ABDOMEN AND PELVIS WITH CONTRAST TECHNIQUE: Multidetector CT imaging of the abdomen and pelvis was performed using the standard protocol following bolus administration of intravenous contrast. RADIATION DOSE REDUCTION: This exam was performed according to the departmental dose-optimization program which includes automated exposure control, adjustment of the mA and/or kV according to patient size and/or use of iterative reconstruction technique. CONTRAST:  OMNIPAQUE  IOHEXOL  300 MG/ML  SOLN COMPARISON:  None Available. FINDINGS: Lower chest: There is atelectasis in the lung bases. Hepatobiliary: No focal liver abnormality is seen. No gallstones, gallbladder wall thickening, or biliary dilatation. Pancreas: Unremarkable. No pancreatic ductal dilatation or surrounding inflammatory changes. Spleen: Normal in size without focal abnormality. Adrenals/Urinary Tract: There is an indeterminate rounded left adrenal lesion measuring 11 mm. Right adrenal gland is within normal limits. The kidneys and bladder are within normal limits. Stomach/Bowel: Small hiatal hernia is present. There is wall thickening of the distal esophagus. Stomach is within normal limits. Appendix appears normal. No evidence of bowel wall thickening, distention, or inflammatory changes. There are scattered sigmoid colon diverticula. Vascular/Lymphatic: Aorta and IVC are normal in size. There are enlarged left external iliac chain lymph nodes measuring up to 16 mm short axis. There is surrounding inflammatory stranding. There also enlarged left inguinal lymph nodes measuring up to 13 mm short axis with surrounding inflammatory stranding. Reproductive: Uterus is slightly lobulated likely related to fibroid change. Adnexa are within normal limits. Other: No abdominal wall hernia or abnormality. No abdominopelvic ascites. Musculoskeletal: No acute or significant  osseous findings. IMPRESSION: 1. Enlarged left external iliac chain and inguinal lymph nodes with surrounding inflammatory stranding. Findings are nonspecific and may be reactive. Follow-up recommended to ensure resolution. 2. Small hiatal hernia with wall thickening of the distal esophagus worrisome for esophagitis. 3. Indeterminate left adrenal lesion measuring 11 mm. Recommend further evaluation with adrenal protocol CT or MRI. 4. Fibroid uterus. 5. Sigmoid colon diverticulosis. Electronically Signed   By: Tyron Gallon M.D.   On: 12/24/2023  19:27   DG Tibia/Fibula Left Result Date: 12/24/2023 CLINICAL DATA:  Skin/soft tissue infection, eval for evidence of underlying osteomyelitis. Redness in the left calf EXAM: LEFT TIBIA AND FIBULA - 2 VIEW COMPARISON:  None Available. FINDINGS: There is no evidence of fracture or other focal bone lesions. Soft tissues are unremarkable. No bone destruction. No radiopaque foreign bodies. IMPRESSION: No acute bony abnormality. Electronically Signed   By: Janeece Mechanic M.D.   On: 12/24/2023 18:33   DG Chest Port 1 View Result Date: 12/24/2023 CLINICAL DATA:  Sepsis EXAM: PORTABLE CHEST 1 VIEW COMPARISON:  CT 11/27/2020 FINDINGS: The heart size and mediastinal contours are within normal limits. Both lungs are clear. The visualized skeletal structures are unremarkable. IMPRESSION: No active disease. Electronically Signed   By: Janeece Mechanic M.D.   On: 12/24/2023 18:24      IMPRESSION AND PLAN:  Assessment and Plan: * Sepsis due to cellulitis [facility]) - The patient will be admitted to an ICU bed. - She will was ordered IV vancomycin and cefepime. - Hydration will be given with IV lactated ringer. - Blood cultures will be followed. - Given persistent hypotension and septic shock the patient was started on IV Levophed. - Intensivist consult was obtained and the case was discussed with the ICU team. - Warm compresses will be utilizing and pain management will be  provided. - Left lower extremity venous Doppler will be obtained to rule out DVT.  Uncontrolled type 2 diabetes mellitus with hyperglycemia, with long-term current use of insulin  (HCC) - The patient will be placed on supplemental coverage with NovoLog. - Will continue with the coverage. - Will continue Glucotrol  and hold off metformin .  Dyslipidemia - Will continue statin therapy.  GERD without esophagitis - Will continue PPI therapy.  Coronary artery disease - Will continue statin therapy and as needed sublingual nitroglycerin .   DVT prophylaxis: Lovenox.  Advanced Care Planning:  Code Status: full code.  Family Communication:  The plan of care was discussed in details with the patient (and family). I answered all questions. The patient agreed to proceed with the above mentioned plan. Further management will depend upon hospital course. Disposition Plan: Back to previous home environment Consults called: Intensivist All the records are reviewed and case discussed with ED provider.  Status is: Inpatient  At the time of the admission, it appears that the appropriate admission status for this patient is inpatient.  This is judged to be reasonable and necessary in order to provide the required intensity of service to ensure the patient's safety given the presenting symptoms, physical exam findings and initial radiographic and laboratory data in the context of comorbid conditions.  The patient requires inpatient status due to high intensity of service, high risk of further deterioration and high frequency of surveillance required.  I certify that at the time of admission, it is my clinical judgment that the patient will require inpatient hospital care extending more than 2 midnights.                            Dispo: The patient is from: Home              Anticipated d/c is to: Home              Patient currently is not medically stable to d/c.              Difficult to place patient:  No. Authorized and performed  by: Amalia Jung, MD Total critical care time:   55     minutes. Due to a high probability of clinically significant, life-threatening deterioration, the patient required my highest level of preparedness to intervene emergently and I personally spent this critical care time directly and personally managing the patient.  This critical care time included obtaining a history, examining the patient, pulse oximetry, ordering and review of studies, arranging urgent treatment with development of management plan, evaluation of patient's response to treatment, frequent reassessment, and discussions with other providers. This critical care time was performed to assess and manage the high probability of imminent, life-threatening deterioration that could result in multiorgan failure.  It was exclusive of separately billable procedures and treating other patients and teaching time.   Virgene Griffin M.D on 12/25/2023 at 4:10 AM  [facility]   From 7 PM-7 AM, contact night-coverage [email or web address]  CC: Primary care physician; Clark, Katherine K, NP

## 2023-12-24 NOTE — Consult Note (Signed)
 NAME:  Alyssa Stone, MRN:  [id number], DOB:  [birth year], LOS: 0 ADMISSION DATE:  12/24/2023, CONSULTATION DATE:  12/24/23 REFERRING MD:  Dr. Cam Cava, CHIEF COMPLAINT:  CODE Sepsis   History of Present Illness:  68 yo F presenting to [facility] ED from home via EMS for evaluation of suspected CODE SEPSIS for complaints of nausea/vomiting and lethargy.  History obtained per chart review and patient/ family bedside report. Patient reported intractable nausea and vomiting for the past day. She has vomited about 10 times, not tolerated food/ water and has not had a bowel movement in 4 days following her recent colonoscopy. She also endorses a fever*** She denies urinary symptoms, cough  EMS reported patient lethargic on arrival with initial HR 130, she received 1 L IVF bolus.  ED course: ***.  Medications given: 1 g acetaminophen , 3 L IVF bolus, zosyn & vancomycin, IV contrast Initial Vitals:  101.9, 15, 116, 133/50 & 92% on RA Significant labs: (Labs/ Imaging personally reviewed) I, Recardo Canal Rust-Chester, AGACNP-BC, personally viewed and interpreted this ECG. EKG Interpretation: Date: 12/24/23, EKG Time: 17:58, Rate: 116, Rhythm: ST, QRS Axis:  LAD Intervals: normal, ST/T Wave abnormalities: none, Narrative Interpretation: ST Chemistry: Na+: 134, K+: 3.3, BUN/Cr.: 24/ 1.23, Serum CO2/ AG: 21/ 13, Albumin: 3.0, AST: 108, glucose: 271 Hematology: WBC: 22.5, Hgb: 12.7,  Lactic/ PCT: 2.1 > 0.9/ pending, COVID-19 & Influenza A/B: negative  CXR 12/24/23: no active disease Tibia/fibia X-ray 12/24/23: no acute bony abnormality CT abdomen/pelvis w contrast 12/24/23: . Enlarged left external iliac chain and inguinal lymph nodes with surrounding inflammatory stranding. Findings are nonspecific and may be reactive. Follow-up recommended to ensure resolution. 2. Small hiatal hernia with wall thickening of the distal esophagus worrisome for esophagitis. 3. Indeterminate left adrenal lesion measuring 11 mm.  Recommend further evaluation with adrenal protocol CT or MRI. 4. Fibroid uterus. 5. Sigmoid colon diverticulosis.  PCCM consulted for admission due to ***.  Pertinent  Medical History  CAD s/p DES Depression ST on metoprolol  GERD Colon polyps Dysphagia HLD T2DM   Significant Hospital Events: Including procedures, antibiotic start and stop dates in addition to other pertinent events     Interim History / Subjective:  ***  Objective   Blood pressure (!) 87/44, pulse 97, temperature 98.5 F (36.9 C), temperature source Oral, resp. rate 16, height 5\' 7"  (1.702 m), weight 99.8 kg, SpO2 94%.       No intake or output data in the 24 hours ending 12/24/23 2325 Filed Weights   12/24/23 1750  Weight: 99.8 kg    Examination: General: *** HENT: *** Lungs: *** Cardiovascular: *** Abdomen: *** Extremities: *** Neuro: *** GU: ***  Resolved Hospital Problem list   ***  Assessment & Plan:  ***  Best Practice (right click and "Reselect all SmartList Selections" daily)   Diet/type: {diet type:25684} DVT prophylaxis {anticoagulation:25687} Pressure ulcer(s): {pressure ulcer(s):31683} GI prophylaxis: {BM:84132} Lines: {Central Venous Access:25771} Foley:  {Central Venous Access:25691} Code Status:  {Code Status:26939} Last date of multidisciplinary goals of care discussion [***]  Labs   CBC: Recent Labs  Lab 12/24/23 1805  WBC 22.5*  NEUTROABS 19.8*  HGB 12.7  HCT 37.7  MCV 87.7  PLT 267    Basic Metabolic Panel: Recent Labs  Lab 12/24/23 1805  NA 134*  K 3.3*  CL 100  CO2 21*  GLUCOSE 271*  BUN 24*  CREATININE 1.23*  CALCIUM  8.5*   GFR: Estimated Creatinine Clearance: 52.4 mL/min (A) (by C-G formula based on  SCr of 1.23 mg/dL (H)). Recent Labs  Lab 12/24/23 1805 12/24/23 2011  WBC 22.5*  --   LATICACIDVEN 2.1* 0.9    Liver Function Tests: Recent Labs  Lab 12/24/23 1805  AST 108*  ALT 36  ALKPHOS 57  BILITOT 1.2  PROT 6.8  ALBUMIN  3.0*   No results for input(s): "LIPASE", "AMYLASE" in the last 168 hours. No results for input(s): "AMMONIA" in the last 168 hours.  ABG No results found for: "PHART", "PCO2ART", "PO2ART", "HCO3", "TCO2", "ACIDBASEDEF", "O2SAT"   Coagulation Profile: Recent Labs  Lab 12/24/23 1805  INR 1.5*    Cardiac Enzymes: No results for input(s): "CKTOTAL", "CKMB", "CKMBINDEX", "TROPONINI" in the last 168 hours.  HbA1C: Hemoglobin A1C  Date/Time Value Ref Range Status  12/13/2023 08:49 AM 8.5 (A) 4.0 - 5.6 % Final  09/05/2023 09:38 AM 7.8 (A) 4.0 - 5.6 % Final   Hgb A1c MFr Bld  Date/Time Value Ref Range Status  02/28/2023 10:13 AM 7.9 (H) 4.6 - 6.5 % Final    Comment:    Glycemic Control Guidelines for People with Diabetes:Non Diabetic:  <6%Goal of Therapy: <7%Additional Action Suggested:  >8%   02/22/2022 09:04 AM 7.6 (H) 4.6 - 6.5 % Final    Comment:    Glycemic Control Guidelines for People with Diabetes:Non Diabetic:  <6%Goal of Therapy: <7%Additional Action Suggested:  >8%     CBG: No results for input(s): "GLUCAP" in the last 168 hours.  Review of Systems:   ***  Past Medical History:  She,  has a past medical history of Allergy, Anemia, CAD (coronary artery disease), Cataract, Chicken pox, Depression, Diabetes mellitus, Fainting episodes, Gastroesophageal reflux disease, Hyperlipidemia, and Sinus tachycardia.   Surgical History:   Past Surgical History:  Procedure Laterality Date   ANAL FISSURE REPAIR     CATARACT EXTRACTION Bilateral 08/20/2022   CESAREAN SECTION     CORONARY IMAGING/OCT N/A 10/14/2020   Procedure: INTRAVASCULAR IMAGING/OCT;  Surgeon: Lucendia Rusk, MD;  Location: [facility] INVASIVE CV LAB;  Service: Cardiovascular;  Laterality: N/A;   CORONARY PRESSURE/FFR STUDY N/A 10/14/2020   Procedure: INTRAVASCULAR PRESSURE WIRE/FFR STUDY;  Surgeon: Lucendia Rusk, MD;  Location: [facility] INVASIVE CV LAB;  Service: Cardiovascular;  Laterality: N/A;   CORONARY  STENT INTERVENTION N/A 10/14/2020   Procedure: CORONARY STENT INTERVENTION;  Surgeon: Lucendia Rusk, MD;  Location[facility] INVASIVE CV LAB;  Service: Cardiovascular;  Laterality: N/A;   EYE SURGERY     LEFT HEART CATH AND CORONARY ANGIOGRAPHY N/A 10/14/2020   Procedure: LEFT HEART CATH AND CORONARY ANGIOGRAPHY;  Surgeon: Lucendia Rusk, MD;  Location: [facility] INVASIVE CV LAB;  Service: Cardiovascular;  Laterality: N/A;   TONSILLECTOMY Bilateral    TUBAL LIGATION       Social History:   reports that she quit smoking about 48 years ago. Her smoking use included cigarettes. She started smoking about 50 years ago. She has never used smokeless tobacco. She reports that she does not currently use alcohol. She reports that she does not use drugs.   Family History:  Her family history includes Cancer in her mother and sister; Diabetes in her brother, mother, and sister; Heart attack in her brother and father; Hyperlipidemia in her brother; Hypertension in her father; Kidney disease in her brother; Miscarriages / [place] in her mother. There is no history of Colon cancer, Colon polyps, Esophageal cancer, Rectal cancer, Stomach cancer, Inflammatory bowel disease, Liver disease, or Pancreatic cancer.   Allergies No Known Allergies  Home Medications  Prior to Admission medications   Medication Sig Start Date End Date Taking? Authorizing Provider  Ascorbic Acid (VITAMIN C) 1000 MG tablet Take 1,000 mg by mouth daily.   Yes [provider]  atorvastatin  (LIPITOR) 40 MG tablet Take 1 tablet (40 mg total) by mouth every evening for cholestrol. 05/28/23  Yes Clark, Katherine K, NP  clobetasol  (TEMOVATE ) 0.05 % external solution Apply 1 application topically to dry scalp once daily as needed for flares. 06/06/23  Yes   clopidogrel  (PLAVIX ) 75 MG tablet Take 1 tablet (75 mg total) by mouth daily. 10/23/23  Yes Nahser, Lela Purple, MD  Cyanocobalamin  (VITAMIN B-12) 5000 MCG SUBL Place 5,000 mcg  under the tongue daily.   Yes [provider]  glipiZIDE  (GLUCOTROL ) 10 MG tablet Take 1 tablet (10 mg total) by mouth 2 (two) times daily before a meal for diabetes. 12/23/23  Yes Clark, Katherine K, NP  insulin  glargine (LANTUS ) 100 UNIT/ML Solostar Pen Inject 15 Units into the skin every evening. for diabetes. 12/11/23  Yes Gabriel John, NP  Lifitegrast  (XIIDRA ) 5 % SOLN Place 1 drop into both eyes 2 (two) times daily. 10/29/22  Yes   linaclotide  (LINZESS ) 145 MCG CAPS capsule Take 1 capsule (145 mcg total) by mouth daily before breakfast. 09/03/23  Yes Mansouraty, Albino Alu., MD  Magnesium 250 MG TABS Take 250 mg by mouth daily.   Yes [provider]  metFORMIN  (GLUCOPHAGE -XR) 500 MG 24 hr tablet Take 1 tablet (500 mg total) by mouth 2 (two) times daily for diabetes. 10/16/23  Yes Clark, Katherine K, NP  metoprolol  succinate (TOPROL -XL) 25 MG 24 hr tablet Take 1 tablet (25 mg total) by mouth daily for heart rate. 05/28/23  Yes Clark, Katherine K, NP  nitroGLYCERIN  (NITROSTAT ) 0.4 MG SL tablet Place 1 tablet (0.4 mg total) under the tongue every 5 (five) minutes as needed for chest pain. 09/13/23  Yes Nahser, Lela Purple, MD  omeprazole  (PRILOSEC) 20 MG capsule Take 1 capsule (20 mg total) by mouth 2 (two) times daily before a meal for 30 days, THEN 1 capsule (20 mg total) daily, THEN stop. 12/20/23 02/18/24 Yes Mansouraty, Albino Alu., MD  sitaGLIPtin  (JANUVIA ) 100 MG tablet Take 1 tablet (100 mg total) by mouth daily. for diabetes. 09/05/23  Yes Gabriel John, NP  tobramycin  (TOBREX ) 0.3 % ophthalmic solution  06/14/21  Yes [provider]  tobramycin  (TOBREX ) 0.3 % ophthalmic solution Place 1 drop into both eyes 4 (four) times daily beginning 1 day before procedure, the day of procedure, and the day after procedure. Continue as directed. 03/13/23  Yes   zinc gluconate 50 MG tablet Take 50 mg by mouth daily.   Yes [provider]  Continuous Glucose Sensor (FREESTYLE  LIBRE 3 PLUS SENSOR) MISC Use to check blood sugar continuously. Change sensor every 15 days. 09/05/23   Clark, Katherine K, NP  glucose blood (ONETOUCH VERIO) test strip use to test blood sugar up to 4 (four) times daily. 02/07/23   Clark, Katherine K, NP  Insulin  Pen Needle (TECHLITE PLUS PEN NEEDLES) 32G X 4 MM MISC Use once daily with insulin . 07/10/23   Clark, Katherine K, NP  Lancets (ONETOUCH DELICA PLUS LANCET33G) MISC USE TO TEST BLOOD SUGAR UP TO 4 TIMES A DAY 12/23/23   Gabriel John, NP  RSV vaccine recomb adjuvanted (AREXVY ) 120 MCG/0.5ML injection Inject into the muscle. 04/27/22   Liane Redman, MD     Critical care time: ***  Recardo Canal Rust-Chester, AGACNP-BC Acute Care Nurse Practitioner [facility]   [phone] / [phone] Please see Amion for details.

## 2023-12-24 NOTE — ED Provider Notes (Signed)
 [facility] Provider Note    Event Date/Time   First MD Initiated Contact with Patient 12/24/23 1757     (approximate)   History   Code Sepsis  Pt to ED via [facility] from home c/o n/v that started one day ago, on EMS arrival Pt Lethargic , initial HR 130, SEPSIS ACTIVATION.   Per EMS pt orientation improving      #18 LAC:  LR given by EMS. Zofran   Last VS: CBG 256, 95%RA, 116, 111/56, 102.5   Of note pt had colonoscopy 4 days ago     HPI Alyssa Stone is a 68 y.o. female PMH T2DM, hyperlipidemia, CAD presents for evaluation of nausea and vomiting -Patient tells me she has been having intractable nausea and vomiting for the past day.  Not tolerating p.o.  Vomited about 10 times.  Has not had a bowel movement in 4 days following her recent colonoscopy. - No urinary symptoms, no cough - Fever since yesterday  Received 1000 cc IV fluid by EMS.       Physical Exam   Triage Vital Signs: ED Triage Vitals  Encounter Vitals Group     BP 12/24/23 1754 (!) 133/50     Systolic BP Percentile --      Diastolic BP Percentile --      Pulse Rate 12/24/23 1754 (!) 116     Resp 12/24/23 1754 15     Temp 12/24/23 1754 (!) 101.9 F (38.8 C)     Temp Source 12/24/23 1754 Oral     SpO2 12/24/23 1754 92 %     Weight 12/24/23 1750 220 lb (99.8 kg)     Height 12/24/23 1750 5\' 7"  (1.702 m)     Head Circumference --      Peak Flow --      Pain Score 12/24/23 1751 0     Pain Loc --      Pain Education --      Exclude from Growth Chart --     Most recent vital signs: Vitals:   12/24/23 1830 12/24/23 1856  BP: (!) 131/47   Pulse: (!) 121   Resp: (!) 22   Temp:  100.3 F (37.9 C)  SpO2: 96%      General: Awake, somewhat lethargic appearing CV:  Good peripheral perfusion.  Tachycardic, regular rhythm, RP 2+ Resp:  Normal effort. CTAB Abd:  No distention. Nontender to deep palpation throughout. No CVAT. Other:  + Confluent erythema  throughout left lower extremity extending from base of ankle to about knee.  Small superficial skin abrasion left lower lateral shin.  No crepitus, no necrotic tissue.  No streaking beyond the   ED Results / Procedures / Treatments   Labs (all labs ordered are listed, but only abnormal results are displayed) Labs Reviewed  COMPREHENSIVE METABOLIC PANEL WITH GFR - Abnormal; Notable for the following components:      Result Value   Sodium 134 (*)    Potassium 3.3 (*)    CO2 21 (*)    Glucose, Bld 271 (*)    BUN 24 (*)    Creatinine, Ser 1.23 (*)    Calcium  8.5 (*)    Albumin 3.0 (*)    AST 108 (*)    GFR, Estimated 48 (*)    All other components within normal limits  LACTIC ACID, PLASMA - Abnormal; Notable for the following components:   Lactic Acid, Venous 2.1 (*)    All other  components within normal limits  CBC WITH DIFFERENTIAL/PLATELET - Abnormal; Notable for the following components:   WBC 22.5 (*)    All other components within normal limits  PROTIME-INR - Abnormal; Notable for the following components:   Prothrombin Time 18.2 (*)    INR 1.5 (*)    All other components within normal limits  RESP PANEL BY RT-PCR (RSV, FLU A&B, COVID)  RVPGX2  CULTURE, BLOOD (ROUTINE X 2)  CULTURE, BLOOD (ROUTINE X 2)  LACTIC ACID, PLASMA  URINALYSIS, W/ REFLEX TO CULTURE (INFECTION SUSPECTED)     EKG  Ecg = tachycardia, rate 116, no ST elevation or depression, no significant repolarization abnormality. + Left axis deviation.  Left anterior fascicular block.  No evidence of ischemia nor arrhythmia on my read.   RADIOLOGY Radiology interpreted by myself and radiology report reviewed.  Possible esophagitis though no acute pathology in abdomen.  X-ray of left lower extremity and chest unremarkable.   PROCEDURES:  Critical Care performed: Yes, see critical care procedure note(s)  .Critical Care  Performed by: Collis Deaner, MD Authorized by: Collis Deaner, MD   Critical care  provider statement:    Critical care time (minutes):  30   Critical care time was exclusive of:  Separately billable procedures and treating other patients   Critical care was necessary to treat or prevent imminent or life-threatening deterioration of the following conditions:  Sepsis   Critical care was time spent personally by me on the following activities:  Development of treatment plan with patient or surrogate, discussions with consultants, evaluation of patient's response to treatment, examination of patient, ordering and review of laboratory studies, ordering and review of radiographic studies, ordering and performing treatments and interventions, pulse oximetry, re-evaluation of patient's condition and review of old charts   I assumed direction of critical care for this patient from another provider in my specialty: no     Care discussed with: admitting provider      MEDICATIONS ORDERED IN ED: Medications  vancomycin (VANCOREADY) IVPB 2000 mg/400 mL (2,000 mg Intravenous New Bag/Given 12/24/23 1855)  sodium chloride  0.9 % bolus 1,000 mL (has no administration in time range)  piperacillin-tazobactam (ZOSYN) IVPB 3.375 g (0 g Intravenous Stopped 12/24/23 1850)  acetaminophen  (TYLENOL ) tablet 1,000 mg (1,000 mg Oral Given 12/24/23 1816)  sodium chloride  0.9 % bolus 1,000 mL (1,000 mLs Intravenous New Bag/Given 12/24/23 1814)  iohexol  (OMNIPAQUE ) 300 MG/ML solution 100 mL (100 mLs Intravenous Contrast Given 12/24/23 1908)     IMPRESSION / MDM / ASSESSMENT AND PLAN / ED COURSE  I reviewed the triage vital signs and the nursing notes.                              DDX/MDM/AP: Differential diagnosis includes, but is not limited to, concern for sepsis, likely secondary to left lower extremity cellulitis which is obvious on my initial exam.  Not clinically concern for necrotizing fasciitis at this time.  I am concerned about patient's intractable nausea and vomiting in the setting of her recent  colonoscopy despite her reassuring abdominal exam.-Given this and her age we will also add CT abdomen pelvis.  Consider possible full bowel perforation or obstruction or other pathology.  Doubt underlying pneumonia, consider UTI.  Consider influenza.  Anticipate admission.  Plan: -Labs - Second liter IV fluid - Broad-spectrum antibiotics - X-ray tib-fib left lower extremity to eval for osteomyelitis or free air - CT abdomen  pelvis - Tylenol  - n.p.o. - Admission  Patient's presentation is most consistent with acute presentation with potential threat to life or bodily function.  The patient is on the cardiac monitor to evaluate for evidence of arrhythmia and/or significant heart rate changes.  ED course below.  Were notable for significant leukocytosis, otherwise reassuring.  Patient does meet severe sepsis criteria, received broad-spectrum antibiotics and 30 cc/kg bolus.  Admitted to hospitalist service.  Clinical Course as of 12/24/23 1935  Tue Dec 24, 2023  1839 WBC(!): 22.5 [MM]  1839 X-rays of the tibia-fibula on the left as well as systems were reviewed, unremarkable on my interpretation.  Radiology reports reviewed. [MM]  1933 CTAP: IMPRESSION: 1. Enlarged left external iliac chain and inguinal lymph nodes with surrounding inflammatory stranding. Findings are nonspecific and may be reactive. Follow-up recommended to ensure resolution. 2. Small hiatal hernia with wall thickening of the distal esophagus worrisome for esophagitis. 3. Indeterminate left adrenal lesion measuring 11 mm. Recommend further evaluation with adrenal protocol CT or MRI. 4. Fibroid uterus. 5. Sigmoid colon diverticulosis.   [MM]  1934 Hospitalist consult order placed [MM]    Clinical Course User Index [MM] Collis Deaner, MD     FINAL CLINICAL IMPRESSION(S) / ED DIAGNOSES   Final diagnoses:  Left leg cellulitis  Severe sepsis [facility])     Rx / DC Orders   ED Discharge Orders     None         Note:  This document was prepared using Dragon voice recognition software and may include unintentional dictation errors.   Collis Deaner, MD 12/24/23 [place]

## 2023-12-24 NOTE — ED Notes (Signed)
 This RN notified attending Mansy MD of pt VS despite fluid bolus completion

## 2023-12-24 NOTE — ED Notes (Signed)
 Pt bp trending down, last BP 87 Systolic, this RN notified EDP Mian, no new orders provided, ordered fluid bolus given, will continue to monitor.

## 2023-12-24 NOTE — ED Notes (Signed)
 Pt to CT

## 2023-12-25 ENCOUNTER — Inpatient Hospital Stay

## 2023-12-25 DIAGNOSIS — I2510 Atherosclerotic heart disease of native coronary artery without angina pectoris: Secondary | ICD-10-CM | POA: Insufficient documentation

## 2023-12-25 DIAGNOSIS — N179 Acute kidney failure, unspecified: Secondary | ICD-10-CM

## 2023-12-25 DIAGNOSIS — E785 Hyperlipidemia, unspecified: Secondary | ICD-10-CM

## 2023-12-25 DIAGNOSIS — R112 Nausea with vomiting, unspecified: Secondary | ICD-10-CM | POA: Diagnosis not present

## 2023-12-25 DIAGNOSIS — E1165 Type 2 diabetes mellitus with hyperglycemia: Secondary | ICD-10-CM

## 2023-12-25 DIAGNOSIS — L0390 Cellulitis, unspecified: Secondary | ICD-10-CM | POA: Diagnosis not present

## 2023-12-25 DIAGNOSIS — E119 Type 2 diabetes mellitus without complications: Secondary | ICD-10-CM

## 2023-12-25 DIAGNOSIS — A419 Sepsis, unspecified organism: Secondary | ICD-10-CM | POA: Diagnosis not present

## 2023-12-25 DIAGNOSIS — K219 Gastro-esophageal reflux disease without esophagitis: Secondary | ICD-10-CM | POA: Insufficient documentation

## 2023-12-25 LAB — COMPREHENSIVE METABOLIC PANEL WITH GFR
ALT: 35 U/L (ref 0–44)
AST: 83 U/L — ABNORMAL HIGH (ref 15–41)
Albumin: 2.8 g/dL — ABNORMAL LOW (ref 3.5–5.0)
Alkaline Phosphatase: 66 U/L (ref 38–126)
Anion gap: 9 (ref 5–15)
BUN: 30 mg/dL — ABNORMAL HIGH (ref 8–23)
CO2: 20 mmol/L — ABNORMAL LOW (ref 22–32)
Calcium: 7.9 mg/dL — ABNORMAL LOW (ref 8.9–10.3)
Chloride: 105 mmol/L (ref 98–111)
Creatinine, Ser: 1.12 mg/dL — ABNORMAL HIGH (ref 0.44–1.00)
GFR, Estimated: 53 mL/min — ABNORMAL LOW (ref >60)
Glucose, Bld: 248 mg/dL — ABNORMAL HIGH (ref 70–99)
Potassium: 3.4 mmol/L — ABNORMAL LOW (ref 3.5–5.1)
Sodium: 134 mmol/L — ABNORMAL LOW (ref 135–145)
Total Bilirubin: 0.8 mg/dL (ref 0.0–1.2)
Total Protein: 6.3 g/dL — ABNORMAL LOW (ref 6.5–8.1)

## 2023-12-25 LAB — GLUCOSE, CAPILLARY
Glucose-Capillary: 147 mg/dL — ABNORMAL HIGH (ref 70–99)
Glucose-Capillary: 162 mg/dL — ABNORMAL HIGH (ref 70–99)
Glucose-Capillary: 169 mg/dL — ABNORMAL HIGH (ref 70–99)
Glucose-Capillary: 218 mg/dL — ABNORMAL HIGH (ref 70–99)
Glucose-Capillary: 226 mg/dL — ABNORMAL HIGH (ref 70–99)
Glucose-Capillary: 234 mg/dL — ABNORMAL HIGH (ref 70–99)
Glucose-Capillary: 265 mg/dL — ABNORMAL HIGH (ref 70–99)

## 2023-12-25 LAB — CBC
HCT: 35.7 % — ABNORMAL LOW (ref 36.0–46.0)
Hemoglobin: 11.8 g/dL — ABNORMAL LOW (ref 12.0–15.0)
MCH: 29.2 pg (ref 26.0–34.0)
MCHC: 33.1 g/dL (ref 30.0–36.0)
MCV: 88.4 fL (ref 80.0–100.0)
Platelets: 301 10*3/uL (ref 150–400)
RBC: 4.04 MIL/uL (ref 3.87–5.11)
RDW: 13.6 % (ref 11.5–15.5)
WBC: 27.2 10*3/uL — ABNORMAL HIGH (ref 4.0–10.5)
nRBC: 0 % (ref 0.0–0.2)

## 2023-12-25 LAB — BLOOD GAS, VENOUS
Acid-base deficit: 4.8 mmol/L — ABNORMAL HIGH (ref 0.0–2.0)
Bicarbonate: 21.1 mmol/L (ref 20.0–28.0)
O2 Saturation: 67.8 %
Patient temperature: 37
pCO2, Ven: 41 mmHg — ABNORMAL LOW (ref 44–60)
pH, Ven: 7.32 (ref 7.25–7.43)
pO2, Ven: 36 mmHg (ref 32–45)

## 2023-12-25 LAB — CORTISOL-AM, BLOOD: Cortisol - AM: 21.9 ug/dL (ref 6.7–22.6)

## 2023-12-25 LAB — SURGICAL PATHOLOGY

## 2023-12-25 LAB — VANCOMYCIN, RANDOM: Vancomycin Rm: 9 ug/mL

## 2023-12-25 LAB — MAGNESIUM
Magnesium: 1.6 mg/dL — ABNORMAL LOW (ref 1.7–2.4)
Magnesium: 1.9 mg/dL (ref 1.7–2.4)

## 2023-12-25 LAB — PHOSPHORUS
Phosphorus: 2.8 mg/dL (ref 2.5–4.6)
Phosphorus: 2.4 mg/dL — ABNORMAL LOW (ref 2.5–4.6)

## 2023-12-25 LAB — MRSA NEXT GEN BY PCR, NASAL: MRSA by PCR Next Gen: NOT DETECTED

## 2023-12-25 LAB — LACTIC ACID, PLASMA
Lactic Acid, Venous: 1.4 mmol/L (ref 0.5–1.9)
Lactic Acid, Venous: 1.5 mmol/L (ref 0.5–1.9)

## 2023-12-25 LAB — PROCALCITONIN: Procalcitonin: 25.27 ng/mL

## 2023-12-25 LAB — PROTIME-INR
INR: 1.4 — ABNORMAL HIGH (ref 0.8–1.2)
Prothrombin Time: 17.2 s — ABNORMAL HIGH (ref 11.4–15.2)

## 2023-12-25 LAB — HIV ANTIBODY (ROUTINE TESTING W REFLEX): HIV Screen 4th Generation wRfx: NONREACTIVE

## 2023-12-25 MED ORDER — SODIUM CHLORIDE 0.9 % IV SOLN
2.0000 g | Freq: Two times a day (BID) | INTRAVENOUS | Status: DC
  Administered 2023-12-25 – 2023-12-26 (×2): 2 g via INTRAVENOUS
  Filled 2023-12-25 (×3): qty 12.5

## 2023-12-25 MED ORDER — METOPROLOL TARTRATE 25 MG PO TABS
25.0000 mg | ORAL_TABLET | Freq: Two times a day (BID) | ORAL | Status: DC
  Administered 2023-12-25 – 2023-12-31 (×13): 25 mg via ORAL
  Filled 2023-12-25 (×13): qty 1

## 2023-12-25 MED ORDER — MEDIHONEY WOUND/BURN DRESSING EX PSTE
1.0000 | PASTE | Freq: Every day | CUTANEOUS | Status: DC
  Administered 2023-12-25 – 2023-12-31 (×8): 1 via TOPICAL
  Filled 2023-12-25 (×2): qty 44

## 2023-12-25 MED ORDER — POTASSIUM CHLORIDE 10 MEQ/100ML IV SOLN
10.0000 meq | INTRAVENOUS | Status: AC
  Administered 2023-12-25 (×2): 10 meq via INTRAVENOUS
  Filled 2023-12-25 (×2): qty 100

## 2023-12-25 MED ORDER — SODIUM CHLORIDE 0.9 % IV SOLN
12.5000 mg | Freq: Four times a day (QID) | INTRAVENOUS | Status: DC | PRN
  Administered 2023-12-25 – 2023-12-28 (×4): 12.5 mg via INTRAVENOUS
  Filled 2023-12-25: qty 12.5
  Filled 2023-12-25: qty 0.5
  Filled 2023-12-25: qty 12.5
  Filled 2023-12-25: qty 0.5
  Filled 2023-12-25 (×2): qty 12.5

## 2023-12-25 MED ORDER — K PHOS MONO-SOD PHOS DI & MONO 155-852-130 MG PO TABS
500.0000 mg | ORAL_TABLET | Freq: Once | ORAL | Status: DC
  Filled 2023-12-25: qty 2

## 2023-12-25 MED ORDER — VANCOMYCIN HCL IN DEXTROSE 1-5 GM/200ML-% IV SOLN
1000.0000 mg | INTRAVENOUS | Status: DC
  Administered 2023-12-25: 1000 mg via INTRAVENOUS
  Filled 2023-12-25: qty 200

## 2023-12-25 MED ORDER — LACTATED RINGERS IV BOLUS
500.0000 mL | Freq: Once | INTRAVENOUS | Status: AC
  Administered 2023-12-25: 500 mL via INTRAVENOUS

## 2023-12-25 MED ORDER — INSULIN ASPART 100 UNIT/ML IJ SOLN
0.0000 [IU] | INTRAMUSCULAR | Status: DC
  Administered 2023-12-25: 4 [IU] via SUBCUTANEOUS
  Administered 2023-12-25 (×2): 7 [IU] via SUBCUTANEOUS
  Administered 2023-12-25: 3 [IU] via SUBCUTANEOUS
  Administered 2023-12-25: 4 [IU] via SUBCUTANEOUS
  Filled 2023-12-25 (×5): qty 1

## 2023-12-25 MED ORDER — INSULIN GLARGINE-YFGN 100 UNIT/ML ~~LOC~~ SOLN
8.0000 [IU] | Freq: Every day | SUBCUTANEOUS | Status: DC
  Administered 2023-12-25 – 2023-12-28 (×4): 8 [IU] via SUBCUTANEOUS
  Filled 2023-12-25 (×4): qty 0.08

## 2023-12-25 MED ORDER — ORAL CARE MOUTH RINSE: 15.0000 mL | OROMUCOSAL | Status: DC | PRN

## 2023-12-25 MED ORDER — INSULIN ASPART 100 UNIT/ML IJ SOLN
0.0000 [IU] | Freq: Three times a day (TID) | INTRAMUSCULAR | Status: DC
  Administered 2023-12-26: 4 [IU] via SUBCUTANEOUS
  Administered 2023-12-26 (×2): 3 [IU] via SUBCUTANEOUS
  Administered 2023-12-26 – 2023-12-27 (×2): 4 [IU] via SUBCUTANEOUS
  Administered 2023-12-27: 15 [IU] via SUBCUTANEOUS
  Administered 2023-12-27: 7 [IU] via SUBCUTANEOUS
  Administered 2023-12-27: 3 [IU] via SUBCUTANEOUS
  Administered 2023-12-28: 7 [IU] via SUBCUTANEOUS
  Administered 2023-12-28: 4 [IU] via SUBCUTANEOUS
  Administered 2023-12-28: 15 [IU] via SUBCUTANEOUS
  Administered 2023-12-28: 3 [IU] via SUBCUTANEOUS
  Administered 2023-12-29: 7 [IU] via SUBCUTANEOUS
  Administered 2023-12-29: 11 [IU] via SUBCUTANEOUS
  Administered 2023-12-29: 4 [IU] via SUBCUTANEOUS
  Administered 2023-12-29: 7 [IU] via SUBCUTANEOUS
  Administered 2023-12-30 (×2): 4 [IU] via SUBCUTANEOUS
  Administered 2023-12-30: 11 [IU] via SUBCUTANEOUS
  Administered 2023-12-30 – 2023-12-31 (×2): 7 [IU] via SUBCUTANEOUS
  Administered 2023-12-31: 4 [IU] via SUBCUTANEOUS
  Filled 2023-12-25 (×22): qty 1

## 2023-12-25 MED ORDER — PANTOPRAZOLE SODIUM 40 MG IV SOLR
40.0000 mg | Freq: Two times a day (BID) | INTRAVENOUS | Status: DC
  Administered 2023-12-25 – 2023-12-31 (×14): 40 mg via INTRAVENOUS
  Filled 2023-12-25 (×14): qty 10

## 2023-12-25 MED ORDER — LACTATED RINGERS IV SOLN: INTRAVENOUS | Status: AC

## 2023-12-25 MED ORDER — PANTOPRAZOLE SODIUM 40 MG IV SOLR: 40.0000 mg | INTRAVENOUS | Status: DC

## 2023-12-25 MED ORDER — ALBUMIN HUMAN 25 % IV SOLN
25.0000 g | Freq: Once | INTRAVENOUS | Status: AC
  Administered 2023-12-25: 25 g via INTRAVENOUS
  Filled 2023-12-25: qty 100

## 2023-12-25 MED ORDER — MAGNESIUM SULFATE 2 GM/50ML IV SOLN
2.0000 g | Freq: Once | INTRAVENOUS | Status: AC
  Administered 2023-12-25: 2 g via INTRAVENOUS
  Filled 2023-12-25: qty 50

## 2023-12-25 NOTE — Progress Notes (Signed)
 NAME:  SIRIAH SINYARD, MRN:  [id number], DOB:  1955-12-03, LOS: 1 ADMISSION DATE:  12/24/2023  History of Present Illness:  Ms. Geer is a 68 year old female patient with a past medical history of type 2 diabetes mellitus presenting overnight to [facility] ICU for left lower extremity cellulitis.  She initially presented with fever headache and upset stomach 0/30.  On 05/2 she underwent a colonoscopy which was unrevealing.  For the past 4 days she has been having nausea vomiting.  On exam she was found to have left lower extremity cellulitis with an open wound at the heel.  Labs were pertinent for leukocytosis with a white count of 22.5 and neutrophils of 81.  Elevated lactate at 2.1. Cr of 1.4 mg/dl from a baseline of 0.6 mg/dl.   She was also noted to have low blood pressure and was started on nor epi.  She received a total of 3 L IV fluid.  And started on Vanco and Zosyn.  CT abdomen pelvis with enlarged external iliac chain and inguinal lymph nodes with surrounding inflammatory stranding.  US  left lower ext negative for Dvt.   Procalcitonin 25.  Blood cultures pending.  On encounter this morning, patient is tachycardic active dry heaving.  Significant Hospital Events: Including procedures, antibiotic start and stop dates in addition to other pertinent events   12/24/2023 - Admitted to the ICU on low dose pressor.  Interim History / Subjective:  Patient still with dry heaving and nausea.   Objective   Blood pressure (!) 150/61, pulse (!) 128, temperature 99.8 F (37.7 C), temperature source Oral, resp. rate (!) 0, height 5\' 7"  (1.702 m), weight 100.1 kg, SpO2 95%.        Intake/Output Summary (Last 24 hours) at 12/25/2023 0944 Last data filed at 12/25/2023 0400 Gross per 24 hour  Intake 2621.59 ml  Output 125 ml  Net 2496.59 ml   Filed Weights   12/24/23 1750 12/25/23 0200  Weight: 99.8 kg 100.1 kg    Physical exam GEN no acute distress HEENT supple neck, reactive  pupils CVS normal S1, normal S2, tachycardic Lungs clear bilateral air entry Abdomen soft nontender nondistended Extremities left lower extremity erythema with open wound at the heel.  Labs and imaging were reviewed  Assessment & Plan:  Assessment and plan Ms. Sgro is a 68 year old female patient with a past medical history of type 2 diabetes mellitus presenting overnight to [facility] ICU for left lower extremity cellulitis.  She initially presented with fever headache and upset stomach 0/30.  On 05/2 she underwent a colonoscopy which was unrevealing.  For the past 4 days she has been having nausea vomiting.  On exam she was found to have left lower extremity cellulitis with an open wound at the heel.  # Resolved septic shock currently off pressors secondary to # Left lower extremity cellulitis with open wound at the left heel # Type 2 diabetes mellitus # Nausea/vomiting likely secondary to the above #AKI from a baseline of 0.6mg /dl  Neuro: No issues  CVS: MAP > 65. Restart home metoprolol .  Pulm: No issue GI: Laxs PRN. Zofran  and Phenergan PRN.  ID: Vanc and zosyn for abx coverage. Skin mark cellulitis.  Renal: Monitor UOP and avoid nephrotox agents.  Heme: DVT px w/ Lovenox  Endo: goal POC 140-180.   Best Practice (right click and "Reselect all SmartList Selections" daily)   Diet/type: Regular consistency (see orders) DVT prophylaxis LMWH Pressure ulcer(s): N/A GI prophylaxis: N/A Lines: N/A Foley:  N/A Code Status:  full code  Last date of multidisciplinary goals of care discussion [12/25/2023]  Critical care time: 60 minutes.    Annitta Kindler, MD  Pulmonary Critical Care 12/25/2023 9:50 AM

## 2023-12-25 NOTE — Assessment & Plan Note (Signed)
Continue atorvastatin and Plavix. ?

## 2023-12-25 NOTE — Consult Note (Signed)
 Pharmacy Antibiotic Note  Alyssa Stone is a 68 y.o. female admitted on 12/24/2023 with cellulitis.  Pharmacy has been consulted for cefepime and vancomycin dosing.  Scr on admit 1.23 (BL ~ 0.5). Improved to 1.12  Plan:  Continue cefepime 2 g IV q12h  Vancomycin 2 g IV x 1 5/6 at 1855 --Check a level at 2000 today to decide on further dosing --Daily Scr per protocol, continue dosing per levels with AKI  Height: 5\' 7"  (170.2 cm) Weight: 100.1 kg (220 lb 10.9 oz) IBW/kg (Calculated) : 61.6  Temp (24hrs), Avg:99.7 F (37.6 C), Min:98.5 F (36.9 C), Max:101.9 F (38.8 C)  Recent Labs  Lab 12/24/23 1805 12/24/23 2011 12/25/23 0143 12/25/23 0439  WBC 22.5*  --   --  27.2*  CREATININE 1.23*  --   --  1.12*  LATICACIDVEN 2.1* 0.9 1.4 1.5    Estimated Creatinine Clearance: 57.6 mL/min (A) (by C-G formula based on SCr of 1.12 mg/dL (H)).    No Known Allergies  Antimicrobials this admission: 5/6 Zosyn x 1 5/6 cefepime >>  5/6 vancomycin >>   Dose adjustments this admission: None  Microbiology results: 5/6 BCx: NGTD 5/7 MRSA PCR: (-)  Thank you for allowing pharmacy to be a part of this patient's care.  Alyssa Stone 12/25/2023 11:45 AM

## 2023-12-25 NOTE — Progress Notes (Signed)
 eLink Physician-Brief Progress Note Patient Name: Alyssa Stone DOB: 07/27/1956 MRN: [id number]   Date of Service  12/25/2023  HPI/Events of Note  Patient admitted with septic shock  and volume depletion resulting in hypotension treated with volume resuscitation + vasopressor administration, suspected foci of infection include cellulitis, and GI sources.  eICU Interventions  New Patient Evaluation.        Jenisha Faison U Morad Tal 12/25/2023, 1:49 AM

## 2023-12-25 NOTE — Consult Note (Signed)
 PHARMACY CONSULT NOTE - ELECTROLYTES  Pharmacy Consult for Electrolyte Monitoring and Replacement   Recent Labs: Potassium (mmol/L)  Date Value  12/25/2023 3.4 (L)   Magnesium (mg/dL)  Date Value  16/05/9603 1.9   Calcium  (mg/dL)  Date Value  54/04/8118 7.9 (L)   Albumin (g/dL)  Date Value  14/78/2956 2.8 (L)   Phosphorus (mg/dL)  Date Value  21/30/8657 2.4 (L)   Sodium (mmol/L)  Date Value  12/25/2023 134 (L)  06/16/2021 142   Height: 5\' 7"  (170.2 cm) Weight: 100.1 kg (220 lb 10.9 oz) IBW/kg (Calculated) : 61.6 Estimated Creatinine Clearance: 57.6 mL/min (A) (by C-G formula based on SCr of 1.12 mg/dL (H)).  Assessment  Alyssa Stone is a 68 y.o. female presenting with septic shock s/t LLE cellulitis. PMH significant for CAD, DM, GERD, HLD, HTN. Pharmacy has been consulted to monitor and replace electrolytes.  Diet: NPO, sips w/ meds MIVF: LR @ 150 mL/hr Pertinent medications: N/A  Goal of Therapy: Electrolytes within normal limits  Plan:  K 3.4, Kcl 10 mEq IV q1h x 2 doses Phos 2.4, K Phos Neutral 500 mg PO x 1 dose Follow-up electrolytes with AM labs tomorrow  Thank you for allowing pharmacy to be a part of this patient's care.  [name] 12/25/2023 9:00 AM

## 2023-12-25 NOTE — Assessment & Plan Note (Deleted)
-   The patient will be admitted to an ICU bed. - She will was ordered IV vancomycin and cefepime. - Hydration will be given with IV lactated ringer. - Blood cultures will be followed. - Given persistent hypotension and septic shock the patient was started on IV Levophed. - Intensivist consult was obtained and the case was discussed with the ICU team. - Warm compresses will be utilizing and pain management will be provided. - Left lower extremity venous Doppler will be obtained to rule out DVT.

## 2023-12-25 NOTE — Assessment & Plan Note (Signed)
 Will continue PPI therapy.

## 2023-12-25 NOTE — Consult Note (Addendum)
 WOC Nurse Consult Note: Reason for Consult: L foot wound  Wound type: L heel full thickness likely diabetic  Pressure Injury POA: NA  Measurement: see nursing flowsheet  Wound bed: linear full thickness red dry hemorrhagic material  Drainage (amount, consistency, odor) see nursing flowsheet, appears dry  Periwound: dry callus  Dressing procedure/placement/frequency: Cleanse L heel wound with soap and water, dry and using a Q tip applicator apply Medihoney to wound bed daily, cover with dry gauze and Kerlix roll gauze.  Place L foot in Prevalon boot to offload pressure.   POC discussed with bedside nurse. Patient would benefit from ongoing management of this wound as outpatient by podiatry/wound care center.  WOC team will not follow. Re-consult if further needs arise.   Thank you,    Ronni Colace MSN, RN-BC, [employer] [phone]

## 2023-12-25 NOTE — Assessment & Plan Note (Addendum)
 Last hemoglobin A1c 8.5.  Will increase long-acting insulin  to 17 units daily.  Patient will go back on her metformin  as outpatient.

## 2023-12-25 NOTE — Assessment & Plan Note (Signed)
-   The patient will be placed on supplemental coverage with NovoLog. - Will continue with the coverage. - Will continue Glucotrol  and hold off metformin .

## 2023-12-25 NOTE — Assessment & Plan Note (Signed)
 Will continue statin therapy

## 2023-12-25 NOTE — Consult Note (Addendum)
 Pharmacy Antibiotic Note  Alyssa Stone is a 68 y.o. female admitted on 12/24/2023 with cellulitis.  Pharmacy has been consulted for cefepime and vancomycin dosing.  Scr on admit 1.23 (BL ~ 0.5). Improved to 1.12  Plan:  Continue cefepime 2 g IV q12h  Vanc random level@1953 : 9 --Appears to be clearing even with AKI(although potential for new elevated Scr baseline?) --Will transition to scheduled AUC dosing for now as well as continue daily evaluation of renal function --Vancomycin 1000 mg IV Q 24 hrs. Goal AUC 400-550. Expected AUC: 468.3 Expected Cmin: 11.7 SCr used: 1.12, Vd used: 0.5  --Daily Scr per protocol  Height: 5\' 7"  (170.2 cm) Weight: 100.1 kg (220 lb 10.9 oz) IBW/kg (Calculated) : 61.6  Temp (24hrs), Avg:99.2 F (37.3 C), Min:98.5 F (36.9 C), Max:99.8 F (37.7 C)  Recent Labs  Lab 12/24/23 1805 12/24/23 2011 12/25/23 0143 12/25/23 0439 12/25/23 1953  WBC 22.5*  --   --  27.2*  --   CREATININE 1.23*  --   --  1.12*  --   LATICACIDVEN 2.1* 0.9 1.4 1.5  --   VANCORANDOM  --   --   --   --  9    Estimated Creatinine Clearance: 57.6 mL/min (A) (by C-G formula based on SCr of 1.12 mg/dL (H)).    No Known Allergies  Antimicrobials this admission: 5/6 Zosyn x 1 5/6 cefepime >>  5/6 vancomycin >>   Dose adjustments this admission: None  Microbiology results: 5/6 BCx: NGTD 5/7 MRSA PCR: (-)  Thank you for allowing pharmacy to be a part of this patient's care.  Cristal Don 12/25/2023 8:53 PM

## 2023-12-25 NOTE — Progress Notes (Signed)
 Updated pt's daughter and daughter-in-law at bedside on plan of care.  All questions answered.    Cherylann Corpus, AGACNP-BC [facility] Prefer epic messenger for cross cover needs If after hours, please call E-link

## 2023-12-26 ENCOUNTER — Inpatient Hospital Stay

## 2023-12-26 DIAGNOSIS — L03116 Cellulitis of left lower limb: Secondary | ICD-10-CM

## 2023-12-26 DIAGNOSIS — A419 Sepsis, unspecified organism: Secondary | ICD-10-CM | POA: Diagnosis not present

## 2023-12-26 DIAGNOSIS — K219 Gastro-esophageal reflux disease without esophagitis: Secondary | ICD-10-CM

## 2023-12-26 DIAGNOSIS — R1110 Vomiting, unspecified: Secondary | ICD-10-CM | POA: Insufficient documentation

## 2023-12-26 DIAGNOSIS — E876 Hypokalemia: Secondary | ICD-10-CM

## 2023-12-26 DIAGNOSIS — E1165 Type 2 diabetes mellitus with hyperglycemia: Secondary | ICD-10-CM | POA: Diagnosis not present

## 2023-12-26 DIAGNOSIS — R531 Weakness: Secondary | ICD-10-CM

## 2023-12-26 DIAGNOSIS — E785 Hyperlipidemia, unspecified: Secondary | ICD-10-CM | POA: Diagnosis not present

## 2023-12-26 DIAGNOSIS — E66812 Obesity, class 2: Secondary | ICD-10-CM

## 2023-12-26 HISTORY — DX: Cellulitis of left lower limb: L03.116

## 2023-12-26 LAB — BASIC METABOLIC PANEL WITH GFR
Anion gap: 9 (ref 5–15)
BUN: 31 mg/dL — ABNORMAL HIGH (ref 8–23)
CO2: 20 mmol/L — ABNORMAL LOW (ref 22–32)
Calcium: 7.9 mg/dL — ABNORMAL LOW (ref 8.9–10.3)
Chloride: 107 mmol/L (ref 98–111)
Creatinine, Ser: 0.8 mg/dL (ref 0.44–1.00)
GFR, Estimated: >60 mL/min (ref >60)
Glucose, Bld: 176 mg/dL — ABNORMAL HIGH (ref 70–99)
Potassium: 3.7 mmol/L (ref 3.5–5.1)
Sodium: 136 mmol/L (ref 135–145)

## 2023-12-26 LAB — GLUCOSE, CAPILLARY
Glucose-Capillary: 172 mg/dL — ABNORMAL HIGH (ref 70–99)
Glucose-Capillary: 175 mg/dL — ABNORMAL HIGH (ref 70–99)
Glucose-Capillary: 148 mg/dL — ABNORMAL HIGH (ref 70–99)
Glucose-Capillary: 155 mg/dL — ABNORMAL HIGH (ref 70–99)
Glucose-Capillary: 143 mg/dL — ABNORMAL HIGH (ref 70–99)

## 2023-12-26 LAB — CBC
HCT: 30 % — ABNORMAL LOW (ref 36.0–46.0)
Hemoglobin: 10.1 g/dL — ABNORMAL LOW (ref 12.0–15.0)
MCH: 29.7 pg (ref 26.0–34.0)
MCHC: 33.7 g/dL (ref 30.0–36.0)
MCV: 88.2 fL (ref 80.0–100.0)
Platelets: 229 10*3/uL (ref 150–400)
RBC: 3.4 MIL/uL — ABNORMAL LOW (ref 3.87–5.11)
RDW: 14.1 % (ref 11.5–15.5)
WBC: 17.8 10*3/uL — ABNORMAL HIGH (ref 4.0–10.5)
nRBC: 0 % (ref 0.0–0.2)

## 2023-12-26 LAB — MAGNESIUM: Magnesium: 2 mg/dL (ref 1.7–2.4)

## 2023-12-26 LAB — URINE CULTURE: Culture: NO GROWTH

## 2023-12-26 LAB — PHOSPHORUS: Phosphorus: 2.2 mg/dL — ABNORMAL LOW (ref 2.5–4.6)

## 2023-12-26 LAB — SEDIMENTATION RATE: Sed Rate: 57 mm/h — ABNORMAL HIGH (ref 0–30)

## 2023-12-26 MED ORDER — K PHOS MONO-SOD PHOS DI & MONO 155-852-130 MG PO TABS
500.0000 mg | ORAL_TABLET | Freq: Once | ORAL | Status: AC
  Administered 2023-12-26: 500 mg via ORAL
  Filled 2023-12-26: qty 2

## 2023-12-26 MED ORDER — SODIUM CHLORIDE 0.9 % IV SOLN
2.0000 g | Freq: Three times a day (TID) | INTRAVENOUS | Status: DC
  Administered 2023-12-26 – 2023-12-27 (×4): 2 g via INTRAVENOUS
  Filled 2023-12-26 (×5): qty 12.5

## 2023-12-26 MED ORDER — VANCOMYCIN HCL 750 MG/150ML IV SOLN
750.0000 mg | INTRAVENOUS | Status: DC
  Administered 2023-12-26: 750 mg via INTRAVENOUS
  Filled 2023-12-26 (×2): qty 150

## 2023-12-26 NOTE — Hospital Course (Addendum)
 68 year old female patient with a past medical history of type 2 diabetes mellitus presenting overnight to [facility] ICU for left lower extremity cellulitis.  She initially presented with fever headache and upset stomach 0/30.  On 05/2 she underwent a colonoscopy which was unrevealing.  For the past 4 days she has been having nausea vomiting.  On exam she was found to have left lower extremity cellulitis with an open wound at the heel.   Labs were pertinent for leukocytosis with a white count of 22.5 and neutrophils of 81.  Elevated lactate at 2.1. Cr of 1.4 mg/dl from a baseline of 0.6 mg/dl.    She was also noted to have low blood pressure and was started on nor epi.  She received a total of 3 L IV fluid.  And started on Vanco and Zosyn .   CT abdomen pelvis with enlarged external iliac chain and inguinal lymph nodes with surrounding inflammatory stranding.   US  left lower ext negative for Dvt.    Procalcitonin 25.  Blood cultures pending.   On encounter this morning, patient is tachycardic active dry heaving.  5/8.  Left lower extremity still hot to touch and very red.  Continue IV antibiotics.  Advance diet to solid food.  Physical therapy evaluation appreciated. 5/9.  Patient doing better than when she came in.  Left leg still red and erythematous.  Will change antibiotics over to Ancef since redness has not improved much with the Maxipime  and vancomycin . 5/10.  White blood cell count down to 13.5.  Erythema is starting to fade in the center but still red around the edges. 5/11.  White blood cell count down to 12.9.  Erythema continuing to fade in the center but still red around the top edge. 5/12.  Patient feeling well.  White blood cell count down to 12.3. 5/13.  White blood cell count normalized at 10.  Patient does have a fever blisters and will prescribe Valtrex x 2 doses.  Gave IV magnesium  this morning and oral potassium.

## 2023-12-26 NOTE — Assessment & Plan Note (Addendum)
BMI 36.19

## 2023-12-26 NOTE — Assessment & Plan Note (Addendum)
 Replaced

## 2023-12-26 NOTE — Consult Note (Signed)
 Pharmacy Antibiotic Note  Alyssa Stone is a 68 y.o. female w/ PMH of DM admitted on 12/24/2023 with cellulitis.  Pharmacy has been consulted for cefepime and vancomycin dosing.  Plan:  1) adjust cefepime to 2 grams IV every 8 hours  2) adjust vancomycin to 750 mg IV every 12 hours Goal AUC 400-550. Expected AUC: 483.7 Expected Cmin: 11.7 SCr used: 0.80 mg/dL, Vd used: 0.5  --Daily Scr per protocol  Height: 5\' 7"  (170.2 cm) Weight: 106.8 kg (235 lb 6.4 oz) IBW/kg (Calculated) : 61.6  Temp (24hrs), Avg:99.4 F (37.4 C), Min:98.8 F (37.1 C), Max:99.8 F (37.7 C)  Recent Labs  Lab 12/24/23 1805 12/24/23 2011 12/25/23 0143 12/25/23 0439 12/25/23 1953 12/26/23 0332  WBC 22.5*  --   --  27.2*  --  17.8*  CREATININE 1.23*  --   --  1.12*  --  0.80  LATICACIDVEN 2.1* 0.9 1.4 1.5  --   --   VANCORANDOM  --   --   --   --  9  --     Estimated Creatinine Clearance: 83.5 mL/min (by C-G formula based on SCr of 0.8 mg/dL).    No Known Allergies  Antimicrobials this admission: 5/6 Zosyn x 1 5/6 cefepime >>  5/6 vancomycin >>   Dose adjustments this admission: None  Microbiology results: 5/6 BCx: NGTD 5/6 UCx: NG final 5/7 MRSA PCR: (-)  Thank you for allowing pharmacy to be a part of this patient's care.  Adalberto Acton 12/26/2023 7:01 AM

## 2023-12-26 NOTE — Progress Notes (Signed)
 Progress Note   Patient: Alyssa Stone [employer] ZOX:[id number] DOB: 17-Feb-1956 DOA: 12/24/2023     2 DOS: the patient was seen and examined on 12/26/2023   Brief hospital course: 68 year old female patient with a past medical history of type 2 diabetes mellitus presenting overnight to [facility] ICU for left lower extremity cellulitis.  She initially presented with fever headache and upset stomach 0/30.  On 05/2 she underwent a colonoscopy which was unrevealing.  For the past 4 days she has been having nausea vomiting.  On exam she was found to have left lower extremity cellulitis with an open wound at the heel.   Labs were pertinent for leukocytosis with a white count of 22.5 and neutrophils of 81.  Elevated lactate at 2.1. Cr of 1.4 mg/dl from a baseline of 0.6 mg/dl.    She was also noted to have low blood pressure and was started on nor epi.  She received a total of 3 L IV fluid.  And started on Vanco and Zosyn.   CT abdomen pelvis with enlarged external iliac chain and inguinal lymph nodes with surrounding inflammatory stranding.   US  left lower ext negative for Dvt.    Procalcitonin 25.  Blood cultures pending.   On encounter this morning, patient is tachycardic active dry heaving.  5/8.  Left lower extremity still hot to touch and very red.  Continue IV antibiotics.  Advance diet to solid food.  Physical therapy evaluation.  Assessment and Plan: * Septic shock (HCC) Secondary to left lower extremity cellulitis.  Present on admission.  Patient with leukocytosis, lactic acidosis, fever, tachycardia and hypotension requiring pressors.  Continue vancomycin and Maxipime for now.  White count still elevated and left lower extremity still hot to the touch and very red.  Cellulitis of left lower extremity Still very red and warm to the touch.  Continue antibiotics vancomycin and Maxipime for now.  Uncontrolled type 2 diabetes mellitus with hyperglycemia, with long-term current use of insulin   (HCC) Last hemoglobin A1c 8.5.  Patient on Semglee  insulin  8 units daily plus sliding scale  Dyslipidemia Continue atorvastatin   Weakness Physical therapy evaluation  Obesity, Class II, BMI 35-39.9 BMI 36.87  Hypokalemia Replaced  Hypophosphatemia Replace orally  Vomiting Seems to have resolved.  Try to advance to solid food.  GERD without esophagitis Patient on Protonix  Coronary artery disease Continue atorvastatin  and Plavix         Subjective: Patient came in with confusion admitted with septic shock requiring pressors.  Found to have left lower extremity cellulitis.  Physical Exam: Vitals:   12/26/23 0100 12/26/23 0120 12/26/23 0500 12/26/23 0724  BP: 128/63 134/69  (!) 140/71  Pulse: 90 94  96  Resp: 11 20  18   Temp:  98.8 F (37.1 C)  98.9 F (37.2 C)  TempSrc:    Oral  SpO2: 96% 97%  94%  Weight:   106.8 kg   Height:       Physical Exam HENT:     Head: Normocephalic.  Eyes:     General: Lids are normal.     Conjunctiva/sclera: Conjunctivae normal.  Cardiovascular:     Rate and Rhythm: Normal rate and regular rhythm.     Heart sounds: Normal heart sounds, S1 normal and S2 normal.  Pulmonary:     Breath sounds: No decreased breath sounds, wheezing, rhonchi or rales.  Abdominal:     Palpations: Abdomen is soft.     Tenderness: There is no abdominal tenderness.  Musculoskeletal:  Right lower leg: No swelling.     Left lower leg: Swelling present.  Skin:    General: Skin is warm.     Comments: Erythema and warmth entire left lower leg.  Some area above the knee laterally.  Fissure seen left heel.  Neurological:     Mental Status: She is alert and oriented to person, place, and time.     Data Reviewed: Creatinine 0.8, phosphorus 2.2, white blood cell count 17.8, hemoglobin 10.1, platelet count 229  Family Communication: Spoke with family at the bedside  Disposition: Status is: Inpatient Remains inpatient appropriate because: Left leg  very hot to touch and red.  Continue IV antibiotics.  Planned Discharge Destination: Home    Time spent: 28 minutes  Author: Verla Glaze, MD 12/26/2023 11:14 AM  For on call review [email or web address].

## 2023-12-26 NOTE — Assessment & Plan Note (Addendum)
 On presentation.  Advance to solid food.

## 2023-12-26 NOTE — Consult Note (Signed)
 PHARMACY CONSULT NOTE - ELECTROLYTES  Pharmacy Consult for Electrolyte Monitoring and Replacement   Recent Labs: Potassium (mmol/L)  Date Value  12/26/2023 3.7   Magnesium (mg/dL)  Date Value  95/62/1308 2.0   Calcium  (mg/dL)  Date Value  65/78/4696 7.9 (L)   Albumin (g/dL)  Date Value  29/52/8413 2.8 (L)   Phosphorus (mg/dL)  Date Value  24/40/1027 2.2 (L)   Sodium (mmol/L)  Date Value  12/26/2023 136  06/16/2021 142   Height: 5\' 7"  (170.2 cm) Weight: 106.8 kg (235 lb 6.4 oz) IBW/kg (Calculated) : 61.6 Estimated Creatinine Clearance: 83.5 mL/min (by C-G formula based on SCr of 0.8 mg/dL).  Assessment  Alyssa Stone is a 68 y.o. female presenting with septic shock s/t LLE cellulitis. PMH significant for CAD, DM, GERD, HLD, HTN. Pharmacy has been consulted to monitor and replace electrolytes.  Diet: NPO, sips w/ meds MIVF: LR @ 150 mL/hr Pertinent medications: N/A  Goal of Therapy: Electrolytes within normal limits  Plan:   K Phos Neutral 500 mg PO x 1 dose Follow-up electrolytes with AM labs tomorrow  Thank you for allowing pharmacy to be a part of this patient's care.  Adalberto Acton 12/26/2023 7:01 AM

## 2023-12-26 NOTE — Assessment & Plan Note (Addendum)
 Still very red and warm to the touch.  Change antibiotics over to Ancef.  Leg elevation.

## 2023-12-26 NOTE — Assessment & Plan Note (Addendum)
 Physical therapy recommends rehab but patient will want to go home with home health.

## 2023-12-26 NOTE — Evaluation (Signed)
 Physical Therapy Evaluation Patient Details Name: Alyssa Stone MRN: [id number] DOB: Apr 30, 1956 Today's Date: 12/26/2023  History of Present Illness  Pt is admitted for septic shock and cellulitis with complaints of multiple days of N/V. history includes DM, CAD, GERD, and HTN.  Clinical Impression  Pt is a pleasant 68 year old female who was admitted for septic shock and cellulitis. Pt performs bed mobility with min/mod assist and transfers via lateral scooting with min assist. Pt very fatigued with exertion and didn't feel she could stay in recliner. Assisted with returning back to bed via lateral scoot. 2 attempts for standing, unsuccessful. Would anticipate +2 assist for standing. Pt demonstrates deficits with strength (B LE), mobility/endurance. Pt educated to continue mobility efforts multiple times throughout the day and to perform HEP. Pt is very hopeful for home discharged. Secure chat sent to care team for coordination. Would benefit from skilled PT to address above deficits and promote optimal return to PLOF. Pt will continue to receive skilled PT services while admitted and will defer to TOC/care team for updates regarding disposition planning.       If plan is discharge home, recommend the following: Two people to help with walking and/or transfers;Two people to help with bathing/dressing/bathroom;Assist for transportation;Help with stairs or ramp for entrance   Can travel by private vehicle   No    Equipment Recommendations Rolling walker (2 wheels);BSC/3in1;Wheelchair (measurements PT);Hospital bed  Recommendations for Other Services       Functional Status Assessment Patient has had a recent decline in their functional status and demonstrates the ability to make significant improvements in function in a reasonable and predictable amount of time.     Precautions / Restrictions Precautions Precautions: Fall Recall of Precautions/Restrictions: Intact Restrictions Weight  Bearing Restrictions Per Provider Order: No      Mobility  Bed Mobility Overal bed mobility: Needs Assistance Bed Mobility: Supine to Sit, Sit to Supine     Supine to sit: Min assist Sit to supine: Mod assist   General bed mobility comments: needs assist for B LE management    Transfers Overall transfer level: Needs assistance Equipment used: Rolling walker (2 wheels) Transfers: Bed to chair/wheelchair/BSC, Sit to/from Stand Sit to Stand: Max assist          Lateral/Scoot Transfers: Min assist General transfer comment: 2 attempts to stand with RW with B LE extreme weakness and buckling. Pt then was able to perform lateral transfer bed<>chair.    Ambulation/Gait               General Gait Details: unable at this time  Stairs            Wheelchair Mobility     Tilt Bed    Modified Rankin (Stroke Patients Only)       Balance Overall balance assessment: Needs assistance Sitting-balance support: Feet supported, Bilateral upper extremity supported Sitting balance-Leahy Scale: Good                                       Pertinent Vitals/Pain Pain Assessment Pain Assessment: No/denies pain    Home Living Family/patient expects to be discharged to:: Private residence Living Arrangements: Alone Available Help at Discharge: Family (per daughter she can move in with her to assist with recovery) Type of Home: House Home Access: Stairs to enter Entrance Stairs-Rails: None Entrance Stairs-Number of Steps: 3   Home Layout: One  level Home Equipment: None      Prior Function Prior Level of Function : Independent/Modified Independent             Mobility Comments: indep, 1 recent fall ADLs Comments: indep     Extremity/Trunk Assessment   Upper Extremity Assessment Upper Extremity Assessment: Generalized weakness (B UE grossly 4/5)    Lower Extremity Assessment Lower Extremity Assessment: Generalized weakness (B LE grossly  2/5)       Communication   Communication Communication: No apparent difficulties    Cognition Arousal: Alert Behavior During Therapy: WFL for tasks assessed/performed   PT - Cognitive impairments: No apparent impairments                       PT - Cognition Comments: pleasant and agreeable to session Following commands: Intact       Cueing Cueing Techniques: Verbal cues     General Comments      Exercises Other Exercises Other Exercises: assisted for complete bed change. Pt was able to use washcloth and clean B LEs with assistance Other Exercises: Seated ther-ex performed on B LE including AP, LAQ, and hip add squeezes. 10 reps with CGA   Assessment/Plan    PT Assessment Patient needs continued PT services  PT Problem List Decreased strength;Decreased activity tolerance;Decreased balance;Decreased mobility;Decreased knowledge of use of DME       PT Treatment Interventions DME instruction;Gait training;Therapeutic activities;Therapeutic exercise;Balance training    PT Goals (Current goals can be found in the Care Plan section)  Acute Rehab PT Goals Patient Stated Goal: to go home with daughter PT Goal Formulation: With patient Time For Goal Achievement: 01/09/24 Potential to Achieve Goals: Good    Frequency Min 3X/week     Co-evaluation               AM-PAC PT "6 Clicks" Mobility  Outcome Measure Help needed turning from your back to your side while in a flat bed without using bedrails?: A Little Help needed moving from lying on your back to sitting on the side of a flat bed without using bedrails?: A Little Help needed moving to and from a bed to a chair (including a wheelchair)?: A Lot Help needed standing up from a chair using your arms (e.g., wheelchair or bedside chair)?: Total Help needed to walk in hospital room?: Total Help needed climbing 3-5 steps with a railing? : Total 6 Click Score: 11    End of Session Equipment Utilized  During Treatment: Gait belt Activity Tolerance: Patient tolerated treatment well Patient left: in bed;with bed alarm set;with family/visitor present;with call bell/phone within reach Nurse Communication: Mobility status PT Visit Diagnosis: Muscle weakness (generalized) (M62.81);History of falling (Z91.81);Difficulty in walking, not elsewhere classified (R26.2)    Time: 9562-1308 PT Time Calculation (min) (ACUTE ONLY): 38 min   Charges:   PT Evaluation $PT Eval Moderate Complexity: 1 Mod PT Treatments $Therapeutic Exercise: 8-22 mins $Therapeutic Activity: 8-22 mins PT General Charges $$ ACUTE PT VISIT: 1 Visit         Amparo Balk, PT, DPT, GCS [phone]   Madgeline Rayo 12/26/2023, 2:40 PM

## 2023-12-26 NOTE — Assessment & Plan Note (Addendum)
 Secondary to left lower extremity cellulitis.  Present on admission.  Patient with leukocytosis, lactic acidosis, fever, tachycardia and hypotension initially requiring pressors.  White blood cell count trending better.  Erythema started to fade in the center.  Aggressive antibiotics changed over to Ancef on 5/9.  Continue IV Ancef.

## 2023-12-27 ENCOUNTER — Other Ambulatory Visit: Payer: Self-pay

## 2023-12-27 DIAGNOSIS — E785 Hyperlipidemia, unspecified: Secondary | ICD-10-CM | POA: Diagnosis not present

## 2023-12-27 DIAGNOSIS — E1165 Type 2 diabetes mellitus with hyperglycemia: Secondary | ICD-10-CM | POA: Diagnosis not present

## 2023-12-27 DIAGNOSIS — A419 Sepsis, unspecified organism: Secondary | ICD-10-CM | POA: Diagnosis not present

## 2023-12-27 DIAGNOSIS — L03116 Cellulitis of left lower limb: Secondary | ICD-10-CM | POA: Diagnosis not present

## 2023-12-27 LAB — GLUCOSE, CAPILLARY
Glucose-Capillary: 140 mg/dL — ABNORMAL HIGH (ref 70–99)
Glucose-Capillary: 171 mg/dL — ABNORMAL HIGH (ref 70–99)
Glucose-Capillary: 226 mg/dL — ABNORMAL HIGH (ref 70–99)
Glucose-Capillary: 306 mg/dL — ABNORMAL HIGH (ref 70–99)

## 2023-12-27 LAB — CBC
HCT: 30.7 % — ABNORMAL LOW (ref 36.0–46.0)
Hemoglobin: 10.3 g/dL — ABNORMAL LOW (ref 12.0–15.0)
MCH: 29.4 pg (ref 26.0–34.0)
MCHC: 33.6 g/dL (ref 30.0–36.0)
MCV: 87.7 fL (ref 80.0–100.0)
Platelets: 227 10*3/uL (ref 150–400)
RBC: 3.5 MIL/uL — ABNORMAL LOW (ref 3.87–5.11)
RDW: 14.1 % (ref 11.5–15.5)
WBC: 16.4 10*3/uL — ABNORMAL HIGH (ref 4.0–10.5)
nRBC: 0 % (ref 0.0–0.2)

## 2023-12-27 LAB — PHOSPHORUS: Phosphorus: 2.1 mg/dL — ABNORMAL LOW (ref 2.5–4.6)

## 2023-12-27 LAB — CREATININE, SERUM
Creatinine, Ser: 0.64 mg/dL (ref 0.44–1.00)
GFR, Estimated: >60 mL/min (ref >60)

## 2023-12-27 LAB — POTASSIUM: Potassium: 3.5 mmol/L (ref 3.5–5.1)

## 2023-12-27 MED ORDER — CEFAZOLIN SODIUM-DEXTROSE 2-4 GM/100ML-% IV SOLN
2.0000 g | Freq: Three times a day (TID) | INTRAVENOUS | Status: DC
  Administered 2023-12-27 – 2023-12-31 (×13): 2 g via INTRAVENOUS
  Filled 2023-12-27 (×13): qty 100

## 2023-12-27 MED ORDER — K PHOS MONO-SOD PHOS DI & MONO 155-852-130 MG PO TABS
500.0000 mg | ORAL_TABLET | ORAL | Status: AC
  Administered 2023-12-27 (×2): 500 mg via ORAL
  Filled 2023-12-27 (×2): qty 2

## 2023-12-27 NOTE — Progress Notes (Signed)
 Physical Therapy Treatment Patient Details Name: Alyssa Stone MRN: [id number] DOB: 06-20-1956 Today's Date: 12/27/2023   History of Present Illness Pt is admitted for septic shock and cellulitis with complaints of multiple days of N/V. PMH includes DM, CAD, GERD, and HTN.    PT Comments  Pt was pleasant and motivated to participate during the session and put forth good effort throughout. Pt required only min A for LLE management during sit to sup but did not require physical assist with any other functional task per below.  Pt was able to come to standing with only min extra effort and cues for general sequencing and once in standing presented with good stability.  Pt was able to amb forwards/backwards at the EOB for around 20 feet with no overt LOB or adverse symptoms.  Pt is making very good progress towards goals and will benefit from continued PT services upon discharge to safely address deficits listed in patient problem list for decreased caregiver assistance and eventual return to PLOF.      If plan is discharge home, recommend the following: Assist for transportation;Help with stairs or ramp for entrance;A little help with walking and/or transfers;A little help with bathing/dressing/bathroom;Assistance with cooking/housework   Can travel by private vehicle     Yes  Equipment Recommendations  Rolling walker (2 wheels);BSC/3in1    Recommendations for Other Services       Precautions / Restrictions Precautions Precautions: Fall Recall of Precautions/Restrictions: Intact Restrictions Weight Bearing Restrictions Per Provider Order: No     Mobility  Bed Mobility Overal bed mobility: Needs Assistance Bed Mobility: Supine to Sit, Sit to Supine     Supine to sit: Supervision Sit to supine: Min assist   General bed mobility comments: Min A for LLE management during sit to sup only, no physical assist needed during sup to sit    Transfers Overall transfer level: Needs  assistance Equipment used: Rolling walker (2 wheels) Transfers: Sit to/from Stand Sit to Stand: Contact guard assist, From elevated surface           General transfer comment: Min verbal cues for sequencing with good control and stability    Ambulation/Gait Ambulation/Gait assistance: Contact guard assist Gait Distance (Feet): 20 Feet Assistive device: Rolling walker (2 wheels) Gait Pattern/deviations: Step-through pattern, Decreased step length - right, Decreased step length - left Gait velocity: decreased     General Gait Details: Slow cadence with short B step length but steady during forwards/backwards ambulation near the EOB for safety   Stairs             Wheelchair Mobility     Tilt Bed    Modified Rankin (Stroke Patients Only)       Balance Overall balance assessment: Needs assistance Sitting-balance support: Feet supported Sitting balance-Leahy Scale: Normal     Standing balance support: Bilateral upper extremity supported, During functional activity Standing balance-Leahy Scale: Good                              Communication Communication Communication: No apparent difficulties  Cognition Arousal: Alert Behavior During Therapy: WFL for tasks assessed/performed   PT - Cognitive impairments: No apparent impairments                       PT - Cognition Comments: pleasant and agreeable to session Following commands: Intact      Cueing Cueing Techniques: Verbal cues  Exercises  Total Joint Exercises Ankle Circles/Pumps: AROM, Strengthening, Both, 10 reps Quad Sets: Strengthening, Both, 10 reps Gluteal Sets: Strengthening, Both, 10 reps Long Arc Quad: AROM, Strengthening, Both, 10 reps Knee Flexion: AROM, Strengthening, Both, 10 reps Marching in Standing: Strengthening, Both, 5 reps, Standing Other Exercises Other Exercises: HEP education and review for BLE APs, QS, GS, and LAQs x 10 each 5-6x/day    General  Comments General comments (skin integrity, edema, etc.): vss throughout      Pertinent Vitals/Pain Pain Assessment Pain Assessment: No/denies pain    Home Living Family/patient expects to be discharged to:: Private residence Living Arrangements: Alone Available Help at Discharge: Family;Other (Comment);Available 24 hours/day (daugther will move in with pt to assist) Type of Home: House Home Access: Stairs to enter Entrance Stairs-Rails: None Entrance Stairs-Number of Steps: 3   Home Layout: One level Home Equipment: None      Prior Function            PT Goals (current goals can now be found in the care plan section) Progress towards PT goals: Progressing toward goals    Frequency    Min 3X/week      PT Plan      Co-evaluation              AM-PAC PT "6 Clicks" Mobility   Outcome Measure  Help needed turning from your back to your side while in a flat bed without using bedrails?: A Little Help needed moving from lying on your back to sitting on the side of a flat bed without using bedrails?: A Little Help needed moving to and from a bed to a chair (including a wheelchair)?: A Little Help needed standing up from a chair using your arms (e.g., wheelchair or bedside chair)?: A Little Help needed to walk in hospital room?: A Little Help needed climbing 3-5 steps with a railing? : A Little 6 Click Score: 18    End of Session Equipment Utilized During Treatment: Gait belt Activity Tolerance: Patient tolerated treatment well Patient left: in bed;with bed alarm set;with call bell/phone within reach (Pt declined up in chair having just gotten back to bed) Nurse Communication: Mobility status PT Visit Diagnosis: Muscle weakness (generalized) (M62.81);History of falling (Z91.81);Difficulty in walking, not elsewhere classified (R26.2)     Time: 1610-9604 PT Time Calculation (min) (ACUTE ONLY): 29 min  Charges:    $Gait Training: 8-22 mins $Therapeutic  Exercise: 8-22 mins PT General Charges $$ ACUTE PT VISIT: 1 Visit                     D. Scott Vidur Knust PT, DPT 12/27/23, 5:20 PM

## 2023-12-27 NOTE — Consult Note (Signed)
 PHARMACY CONSULT NOTE - ELECTROLYTES  Pharmacy Consult for Electrolyte Monitoring and Replacement   Recent Labs: Potassium (mmol/L)  Date Value  12/27/2023 3.5   Magnesium  (mg/dL)  Date Value  81/19/1478 2.0   Calcium  (mg/dL)  Date Value  29/56/2130 7.9 (L)   Albumin  (g/dL)  Date Value  86/57/8469 2.8 (L)   Phosphorus (mg/dL)  Date Value  62/95/2841 2.1 (L)   Sodium (mmol/L)  Date Value  12/26/2023 136  06/16/2021 142   Height: 5\' 7"  (170.2 cm) Weight: 109.4 kg (241 lb 2.9 oz) IBW/kg (Calculated) : 61.6 Estimated Creatinine Clearance: 84.6 mL/min (by C-G formula based on SCr of 0.64 mg/dL).  Assessment  Alyssa Stone is a 68 y.o. female presenting with septic shock s/t LLE cellulitis. PMH significant for CAD, DM, GERD, HLD, HTN. Pharmacy has been consulted to monitor and replace electrolytes.  Goal of Therapy: Electrolytes within normal limits  Plan:  K Phos  Neutral 500 mg PO x 2 Follow-up electrolytes with AM labs tomorrow  Thank you for allowing pharmacy to be a part of this patient's care.  Adalberto Acton 12/27/2023 7:57 AM

## 2023-12-27 NOTE — Progress Notes (Signed)
 Progress Note   Patient: Alyssa Stone [facility] UJW:[id number] DOB: [birth year] DOA: 12/24/2023     3 DOS: the patient was seen and examined on 12/27/2023   Brief hospital course: 68 year old female patient with a past medical history of type 2 diabetes mellitus presenting overnight to [facility] ICU for left lower extremity cellulitis.  She initially presented with fever headache and upset stomach 0/30.  On 05/2 she underwent a colonoscopy which was unrevealing.  For the past 4 days she has been having nausea vomiting.  On exam she was found to have left lower extremity cellulitis with an open wound at the heel.   Labs were pertinent for leukocytosis with a white count of 22.5 and neutrophils of 81.  Elevated lactate at 2.1. Cr of 1.4 mg/dl from a baseline of 0.6 mg/dl.    She was also noted to have low blood pressure and was started on nor epi.  She received a total of 3 L IV fluid.  And started on Vanco and Zosyn .   CT abdomen pelvis with enlarged external iliac chain and inguinal lymph nodes with surrounding inflammatory stranding.   US  left lower ext negative for Dvt.    Procalcitonin 25.  Blood cultures pending.   On encounter this morning, patient is tachycardic active dry heaving.  5/8.  Left lower extremity still hot to touch and very red.  Continue IV antibiotics.  Advance diet to solid food.  Physical therapy evaluation appreciated. 5/9.  Patient doing better than when she came in.  Left leg still red and erythematous.  Will change antibiotics over to Ancef since redness has not improved much with the Maxipime  and vancomycin .  Assessment and Plan: * Septic shock (HCC) Secondary to left lower extremity cellulitis.  Present on admission.  Patient with leukocytosis, lactic acidosis, fever, tachycardia and hypotension initially requiring pressors.  White blood cell count trending better but left leg still very erythematous and hot.  Will change antibiotics from Maxipime  and vancomycin  over to Ancef  to see if we can get quicker improvement.  Cellulitis of left lower extremity Still very red and warm to the touch.  Change antibiotics over to Ancef.  Leg elevation.  Uncontrolled type 2 diabetes mellitus with hyperglycemia, with long-term current use of insulin  (HCC) Last hemoglobin A1c 8.5.  Patient on Semglee  insulin  8 units daily plus sliding scale  Dyslipidemia Continue atorvastatin   Weakness Physical therapy recommends rehab but patient will want to go home with home health.  Obesity, Class II, BMI 35-39.9 BMI 37.77  Hypokalemia Replaced  Hypophosphatemia Replace orally again today  Vomiting On presentation.  Advance to solid food.  GERD without esophagitis Patient on Protonix   Coronary artery disease Continue atorvastatin  and Plavix         Subjective: Patient still very weak.  Physical therapy recommending rehab but patient will want to go home.  Advised to get stronger while here.  Patient still has redness and warmth of the left lower extremity.  Admitted with septic shock.  Physical Exam: Vitals:   12/26/23 1547 12/26/23 2047 12/27/23 0413 12/27/23 0747  BP: (!) 141/71 139/60 (!) 141/71 (!) 149/68  Pulse: 89 97 93 85  Resp: 18 16 16 18   Temp: 98.3 F (36.8 C) 98.8 F (37.1 C) 98.9 F (37.2 C) 98 F (36.7 C)  TempSrc: Oral   Oral  SpO2: 96% 95% 95% 97%  Weight:   109.4 kg   Height:       Physical Exam HENT:  Head: Normocephalic.  Eyes:     General: Lids are normal.     Conjunctiva/sclera: Conjunctivae normal.  Cardiovascular:     Rate and Rhythm: Normal rate and regular rhythm.     Heart sounds: Normal heart sounds, S1 normal and S2 normal.  Pulmonary:     Breath sounds: No decreased breath sounds, wheezing, rhonchi or rales.  Abdominal:     Palpations: Abdomen is soft.     Tenderness: There is no abdominal tenderness.  Musculoskeletal:     Right lower leg: No swelling.     Left lower leg: Swelling present.  Skin:    General: Skin  is warm.     Comments: Erythema and warmth entire left lower leg.  Some area above the knee laterally.  Fissure seen left heel.  Neurological:     Mental Status: She is alert and oriented to person, place, and time.     Data Reviewed: Potassium 3.5, creatinine 0.64, phosphorus 2.1, white blood cell count 16.4, hemoglobin 10.3, platelet count 220  Family Communication: Spoke with son at the bedside  Disposition: Status is: Inpatient Remains inpatient appropriate because: Left lower extremity still very erythematous and warm.  Will change antibiotics over to Ancef to see if we can get quicker improvement.  Planned Discharge Destination: Home with Home Health    Time spent: 28 minutes  Author: Verla Glaze, MD 12/27/2023 1:47 PM  For on call review [email or web address].

## 2023-12-27 NOTE — Care Management Important Message (Signed)
 Important Message  Patient Details  Name: Alyssa Stone MRN: [id number] Date of Birth: 1956-08-04   Important Message Given:  Yes - Medicare IM     Anise Kerns 12/27/2023, 2:17 PM

## 2023-12-27 NOTE — Evaluation (Signed)
 Occupational Therapy Evaluation Patient Details Name: Alyssa Stone MRN: [id number] DOB: 09/13/1955 Today's Date: 12/27/2023   History of Present Illness   Pt is admitted for septic shock and cellulitis with complaints of multiple days of N/V. history includes DM, CAD, GERD, and HTN.     Clinical Impressions Chart reviewed to date, pt greeted semi supine in bed, agreeable to OT evaluation. PTA pt is indep in ADL/IADL, works as a Engineer, water. Pt presents with deficits in strength, endurance, activity tolerance, balance affecting sfae and optimal ADL completion. Bed mobility completed with supervision, STS with CGA-MIN A, short amb transfers in room to bsc then bedside chair with CGA-MIN A with RW. Toileting completed with MOD A for thoroughness, doff/donn R sock with supervision, MAX A for L prevlon boot. Pt is performing Adl below PLOF, will benefit from acute OT to address functional deficits and to facilitate optimal ADL performance. Pt is left in bedside chair, all needs met. OT will follow acutely.      If plan is discharge home, recommend the following:   A little help with walking and/or transfers;A little help with bathing/dressing/bathroom;Help with stairs or ramp for entrance;Assistance with cooking/housework     Functional Status Assessment   Patient has had a recent decline in their functional status and demonstrates the ability to make significant improvements in function in a reasonable and predictable amount of time.     Equipment Recommendations   BSC/3in1;Tub/shower bench;Wheelchair (measurements OT)     Recommendations for Other Services         Precautions/Restrictions   Precautions Precautions: Fall Recall of Precautions/Restrictions: Intact Restrictions Weight Bearing Restrictions Per Provider Order: No     Mobility Bed Mobility Overal bed mobility: Needs Assistance Bed Mobility: Supine to Sit     Supine to sit: Supervision           Transfers Overall transfer level: Needs assistance Equipment used: Rolling walker (2 wheels) Transfers: Sit to/from Stand Sit to Stand: Contact guard assist, Min assist                  Balance Overall balance assessment: Needs assistance Sitting-balance support: Feet supported, Bilateral upper extremity supported Sitting balance-Leahy Scale: Good     Standing balance support: Bilateral upper extremity supported, During functional activity, Reliant on assistive device for balance Standing balance-Leahy Scale: Good                             ADL either performed or assessed with clinical judgement   ADL Overall ADL's : Needs assistance/impaired Eating/Feeding: Set up;Sitting   Grooming: Wash/dry face;Sitting;Set up       Lower Body Bathing: Moderate assistance Lower Body Bathing Details (indicate cue type and reason): for thoroughness     Lower Body Dressing: Supervision/safety;Sitting/lateral leans Lower Body Dressing Details (indicate cue type and reason): doff/donn socks- R side figure 4; anticipate assist for LLE with edema; MAX A for PRAFO Toilet Transfer: Contact guard assist;Minimal assistance;Rolling walker (2 wheels);BSC/3in1   Toileting- Clothing Manipulation and Hygiene: Moderate assistance;Sit to/from stand       Functional mobility during ADLs: Minimal assistance;Contact guard assist;Rolling walker (2 wheels) (short amb transfers in room approx 6' with RW)       Vision Patient Visual Report: No change from baseline       Perception         Praxis         Pertinent Vitals/Pain Pain Assessment  Pain Assessment: No/denies pain     Extremity/Trunk Assessment Upper Extremity Assessment Upper Extremity Assessment: Overall WFL for tasks assessed   Lower Extremity Assessment Lower Extremity Assessment: Generalized weakness       Communication Communication Communication: No apparent difficulties   Cognition Arousal:  Alert Behavior During Therapy: WFL for tasks assessed/performed Cognition: No apparent impairments                               Following commands: Intact       Cueing  General Comments   Cueing Techniques: Verbal cues  vss throughout   Exercises Other Exercises Other Exercises: edu re: role of OT, role of rehab, discharge recommendations, DME for safe ADL completion   Shoulder Instructions      Home Living Family/patient expects to be discharged to:: Private residence Living Arrangements: Alone Available Help at Discharge: Family;Other (Comment);Available 24 hours/day (daugther will move in with pt to assist) Type of Home: House Home Access: Stairs to enter [employer] of Steps: 3 Entrance Stairs-Rails: None Home Layout: One level     Bathroom Shower/Tub: Chief Strategy Officer: Standard     Home Equipment: None          Prior Functioning/Environment Prior Level of Function : Independent/Modified Independent                    OT Problem List: Decreased strength;Decreased activity tolerance;Decreased knowledge of use of DME or AE;Impaired balance (sitting and/or standing)   OT Treatment/Interventions: Self-care/ADL training;DME and/or AE instruction;Therapeutic activities;Balance training;Therapeutic exercise;Energy conservation;Patient/family education      OT Goals(Current goals can be found in the care plan section)   Acute Rehab OT Goals Patient Stated Goal: go home OT Goal Formulation: With patient Time For Goal Achievement: 01/10/24 Potential to Achieve Goals: Good ADL Goals Pt Will Perform Grooming: with modified independence;sitting;standing Pt Will Perform Lower Body Dressing: with modified independence;sitting/lateral leans;sit to/from stand Pt Will Transfer to Toilet: with modified independence;ambulating Pt Will Perform Toileting - Clothing Manipulation and hygiene: with modified  independence;sitting/lateral leans;sit to/from stand   OT Frequency:  Min 3X/week    Co-evaluation              AM-PAC OT "6 Clicks" Daily Activity     Outcome Measure Help from another person eating meals?: None Help from another person taking care of personal grooming?: None Help from another person toileting, which includes using toliet, bedpan, or urinal?: A Little Help from another person bathing (including washing, rinsing, drying)?: A Little Help from another person to put on and taking off regular upper body clothing?: None Help from another person to put on and taking off regular lower body clothing?: A Little 6 Click Score: 21   End of Session Equipment Utilized During Treatment: Rolling walker (2 wheels) Nurse Communication: Mobility status  Activity Tolerance: Patient tolerated treatment well Patient left: in chair;with call bell/phone within reach  OT Visit Diagnosis: Other abnormalities of gait and mobility (R26.89)                Time: 6213-0865 OT Time Calculation (min): 23 min Charges:  OT General Charges $OT Visit: 1 Visit OT Evaluation $OT Eval Low Complexity: 1 Low  Gerre Kraft, OTD OTR/L  12/27/23, 1:42 PM

## 2023-12-28 DIAGNOSIS — E1165 Type 2 diabetes mellitus with hyperglycemia: Secondary | ICD-10-CM | POA: Diagnosis not present

## 2023-12-28 DIAGNOSIS — E785 Hyperlipidemia, unspecified: Secondary | ICD-10-CM | POA: Diagnosis not present

## 2023-12-28 DIAGNOSIS — A419 Sepsis, unspecified organism: Secondary | ICD-10-CM | POA: Diagnosis not present

## 2023-12-28 DIAGNOSIS — L03116 Cellulitis of left lower limb: Secondary | ICD-10-CM | POA: Diagnosis not present

## 2023-12-28 LAB — CBC
HCT: 32.7 % — ABNORMAL LOW (ref 36.0–46.0)
Hemoglobin: 10.8 g/dL — ABNORMAL LOW (ref 12.0–15.0)
MCH: 28.8 pg (ref 26.0–34.0)
MCHC: 33 g/dL (ref 30.0–36.0)
MCV: 87.2 fL (ref 80.0–100.0)
Platelets: 280 10*3/uL (ref 150–400)
RBC: 3.75 MIL/uL — ABNORMAL LOW (ref 3.87–5.11)
RDW: 13.6 % (ref 11.5–15.5)
WBC: 13.5 10*3/uL — ABNORMAL HIGH (ref 4.0–10.5)
nRBC: 0 % (ref 0.0–0.2)

## 2023-12-28 LAB — MAGNESIUM: Magnesium: 1.9 mg/dL (ref 1.7–2.4)

## 2023-12-28 LAB — GLUCOSE, CAPILLARY
Glucose-Capillary: 175 mg/dL — ABNORMAL HIGH (ref 70–99)
Glucose-Capillary: 174 mg/dL — ABNORMAL HIGH (ref 70–99)
Glucose-Capillary: 241 mg/dL — ABNORMAL HIGH (ref 70–99)
Glucose-Capillary: 321 mg/dL — ABNORMAL HIGH (ref 70–99)
Glucose-Capillary: 228 mg/dL — ABNORMAL HIGH (ref 70–99)

## 2023-12-28 LAB — RENAL FUNCTION PANEL
Albumin: 2.3 g/dL — ABNORMAL LOW (ref 3.5–5.0)
Anion gap: 9 (ref 5–15)
BUN: 14 mg/dL (ref 8–23)
CO2: 23 mmol/L (ref 22–32)
Calcium: 8.2 mg/dL — ABNORMAL LOW (ref 8.9–10.3)
Chloride: 104 mmol/L (ref 98–111)
Creatinine, Ser: 0.57 mg/dL (ref 0.44–1.00)
GFR, Estimated: >60 mL/min (ref >60)
Glucose, Bld: 179 mg/dL — ABNORMAL HIGH (ref 70–99)
Phosphorus: 2.6 mg/dL (ref 2.5–4.6)
Potassium: 3.2 mmol/L — ABNORMAL LOW (ref 3.5–5.1)
Sodium: 136 mmol/L (ref 135–145)

## 2023-12-28 MED ORDER — POTASSIUM CHLORIDE CRYS ER 20 MEQ PO TBCR
40.0000 meq | EXTENDED_RELEASE_TABLET | ORAL | Status: AC
  Administered 2023-12-28 (×2): 40 meq via ORAL
  Filled 2023-12-28 (×2): qty 2

## 2023-12-28 MED ORDER — INSULIN GLARGINE-YFGN 100 UNIT/ML ~~LOC~~ SOLN
12.0000 [IU] | Freq: Every day | SUBCUTANEOUS | Status: DC
Start: 2023-12-29 — End: 2023-12-29
  Administered 2023-12-29: 12 [IU] via SUBCUTANEOUS
  Filled 2023-12-28: qty 0.12

## 2023-12-28 NOTE — Progress Notes (Signed)
 Progress Note   Patient: Alyssa Stone [facility] WGN:[id number] DOB: 08-22-1955 DOA: 12/24/2023     4 DOS: the patient was seen and examined on 12/28/2023   Brief hospital course: 68 year old female patient with a past medical history of type 2 diabetes mellitus presenting overnight to [facility] ICU for left lower extremity cellulitis.  She initially presented with fever headache and upset stomach 0/30.  On 05/2 she underwent a colonoscopy which was unrevealing.  For the past 4 days she has been having nausea vomiting.  On exam she was found to have left lower extremity cellulitis with an open wound at the heel.   Labs were pertinent for leukocytosis with a white count of 22.5 and neutrophils of 81.  Elevated lactate at 2.1. Cr of 1.4 mg/dl from a baseline of 0.6 mg/dl.    She was also noted to have low blood pressure and was started on nor epi.  She received a total of 3 L IV fluid.  And started on Vanco and Zosyn .   CT abdomen pelvis with enlarged external iliac chain and inguinal lymph nodes with surrounding inflammatory stranding.   US  left lower ext negative for Dvt.    Procalcitonin 25.  Blood cultures pending.   On encounter this morning, patient is tachycardic active dry heaving.  5/8.  Left lower extremity still hot to touch and very red.  Continue IV antibiotics.  Advance diet to solid food.  Physical therapy evaluation appreciated. 5/9.  Patient doing better than when she came in.  Left leg still red and erythematous.  Will change antibiotics over to Ancef since redness has not improved much with the Maxipime  and vancomycin . 5/10.  White blood cell count down to 13.5.  Erythema is starting to fade in the center but still red around the edges.  Assessment and Plan: * Septic shock (HCC) Secondary to left lower extremity cellulitis.  Present on admission.  Patient with leukocytosis, lactic acidosis, fever, tachycardia and hypotension initially requiring pressors.  White blood cell count trending  better.  Erythema started to fade in the center.  Aggressive antibiotics changed over to Ancef on 5/9.  Continue IV Ancef.  Cellulitis of left lower extremity Erythema starting to fade.  Continue IV Ancef.  Leg elevation.  Uncontrolled type 2 diabetes mellitus with hyperglycemia, with long-term current use of insulin  (HCC) Last hemoglobin A1c 8.5.  Will increase long-acting insulin  to 12 units daily.  Continue sliding scale insulin .  Dyslipidemia Continue atorvastatin   Weakness Physical therapy recommends rehab but patient will want to go home with home health.  Obesity, Class II, BMI 35-39.9 BMI 37.77  Hypokalemia Replace orally today.  Hypophosphatemia Replaced  Vomiting On presentation and resolved.  GERD without esophagitis Patient on Protonix   Coronary artery disease Continue atorvastatin  and Plavix         Subjective: Patient feeling little bit better.  Doing better with physical therapy.  Admitted with septic shock secondary to left lower extremity cellulitis.  Physical Exam: Vitals:   12/27/23 1527 12/27/23 2053 12/28/23 0447 12/28/23 0813  BP: (!) 142/67 137/65 138/68 (!) 160/74  Pulse: 83 90 83 86  Resp: 18 18 18 16   Temp: 98.3 F (36.8 C) 98.1 F (36.7 C) 98.7 F (37.1 C) 97.9 F (36.6 C)  TempSrc: Oral  Oral   SpO2: 97% 94% 95% 99%  Weight:   107.5 kg   Height:       Physical Exam HENT:     Head: Normocephalic.  Eyes:  General: Lids are normal.     Conjunctiva/sclera: Conjunctivae normal.  Cardiovascular:     Rate and Rhythm: Normal rate and regular rhythm.     Heart sounds: Normal heart sounds, S1 normal and S2 normal.  Pulmonary:     Breath sounds: No decreased breath sounds, wheezing, rhonchi or rales.  Abdominal:     Palpations: Abdomen is soft.     Tenderness: There is no abdominal tenderness.  Musculoskeletal:     Right lower leg: No swelling.     Left lower leg: Swelling present.  Skin:    General: Skin is warm.      Comments: Erythema and warmth entire left lower leg.  Erythema starting to fade in the center anteriorly.  Fissure seen left heel.  Neurological:     Mental Status: She is alert and oriented to person, place, and time.     Data Reviewed: Potassium 3.2, creatinine 0.57, albumin  2.3, white blood count 13.5, hemoglobin 10.8  Family Communication: Updated patient's daughter on the phone  Disposition: Status is: Inpatient Remains inpatient appropriate because: Continue IV Ancef.  Erythema is starting to fade.  White blood cell count trending in the right direction.  Planned Discharge Destination: Home    Time spent: 28 minutes  Author: Verla Glaze, MD 12/28/2023 1:34 PM  For on call review [email or web address].

## 2023-12-28 NOTE — Progress Notes (Signed)
 Physical Therapy Treatment Patient Details Name: Alyssa Stone MRN: [id number] DOB: [birth year] Today's Date: 12/28/2023   History of Present Illness Pt is admitted for septic shock and cellulitis with complaints of multiple days of N/V. PMH includes DM, CAD, GERD, and HTN.    PT Comments  Overall, pt demonstrated much improved activity tolerance and safety with mobility, transfers, and gait performing at an overall supervision to Mod I level with RW. Ambulated 100' with RW at supervision with no pain in LLE reported. DC recs remain appropriate to maximize her independence and safety with all ADLs.      If plan is discharge home, recommend the following: Assist for transportation;Help with stairs or ramp for entrance;A little help with walking and/or transfers;A little help with bathing/dressing/bathroom;Assistance with cooking/housework   Can travel by private vehicle     Yes  Equipment Recommendations  Rolling walker (2 wheels);BSC/3in1    Recommendations for Other Services       Precautions / Restrictions Precautions Precautions: Fall Recall of Precautions/Restrictions: Intact Restrictions Weight Bearing Restrictions Per Provider Order: No     Mobility  Bed Mobility Overal bed mobility: Needs Assistance Bed Mobility: Supine to Sit     Supine to sit: Supervision     General bed mobility comments: Pt able to manage LLE    Transfers Overall transfer level: Needs assistance Equipment used: Rolling walker (2 wheels) Transfers: Sit to/from Stand, Bed to chair/wheelchair/BSC Sit to Stand: Supervision   Step pivot transfers: Supervision       General transfer comment: pt performed sequencing with good control and stability    Ambulation/Gait Ambulation/Gait assistance: Supervision Gait Distance (Feet): 100 Feet Assistive device: Rolling walker (2 wheels) Gait Pattern/deviations: Step-through pattern, Decreased step length - right, Decreased step length -  left Gait velocity: decreased     General Gait Details: Slow cadence with short B step length but steady during forwards/backwards ambulation   Stairs             Wheelchair Mobility     Tilt Bed    Modified Rankin (Stroke Patients Only)       Balance Overall balance assessment: Needs assistance Sitting-balance support: Feet supported Sitting balance-Leahy Scale: Normal     Standing balance support: During functional activity, Single extremity supported Standing balance-Leahy Scale: Good Standing balance comment: intermittent UE support with dynamic standing balance.                            Communication Communication Communication: No apparent difficulties  Cognition Arousal: Alert Behavior During Therapy: WFL for tasks assessed/performed   PT - Cognitive impairments: No apparent impairments                       PT - Cognition Comments: pleasant and agreeable to session Following commands: Intact      Cueing Cueing Techniques: Verbal cues  Exercises      General Comments        Pertinent Vitals/Pain Pain Assessment Pain Assessment: No/denies pain    Home Living                          Prior Function            PT Goals (current goals can now be found in the care plan section) Acute Rehab PT Goals Patient Stated Goal: to go home with daughter PT Goal Formulation: With patient  Time For Goal Achievement: 01/09/24 Potential to Achieve Goals: Good Progress towards PT goals: Progressing toward goals    Frequency    Min 3X/week      PT Plan      Co-evaluation              AM-PAC PT "6 Clicks" Mobility   Outcome Measure  Help needed turning from your back to your side while in a flat bed without using bedrails?: A Little Help needed moving from lying on your back to sitting on the side of a flat bed without using bedrails?: A Little Help needed moving to and from a bed to a chair (including  a wheelchair)?: A Little Help needed standing up from a chair using your arms (e.g., wheelchair or bedside chair)?: A Little Help needed to walk in hospital room?: A Little Help needed climbing 3-5 steps with a railing? : A Little 6 Click Score: 18    End of Session Equipment Utilized During Treatment: Gait belt Activity Tolerance: Patient tolerated treatment well Patient left: with call bell/phone within reach;in chair Nurse Communication: Mobility status PT Visit Diagnosis: Muscle weakness (generalized) (M62.81);History of falling (Z91.81);Difficulty in walking, not elsewhere classified (R26.2)     Time: 1013-1040 PT Time Calculation (min) (ACUTE ONLY): 27 min  Charges:    $Therapeutic Activity: 23-37 mins PT General Charges $$ ACUTE PT VISIT: 1 Visit                    Eliazar Gross, PTA  12/28/23, 10:54 AM

## 2023-12-28 NOTE — Progress Notes (Signed)
 Occupational Therapy Treatment Patient Details Name: Alyssa Stone MRN: [id number] DOB: 1956-08-06 Today's Date: 12/28/2023   History of present illness Pt is admitted for septic shock and cellulitis with complaints of multiple days of N/V. PMH includes DM, CAD, GERD, and HTN.   OT comments  Chart reviewed to date, pt greeted in chair, agreeable to OT tx session targeting improving functional activity tolerance in prep for ADL tasks. Improvements noted throughout, STS completed with supervision-CGA, amb with RW, pt amb to sink with RW with supervision-CGA, standing grooming tasks with supervision, amb aprpox 100' with RW with CGA-supervision. Discussed safe ADL completion with DME as appropriate. Pt is left in chair, all needs met. OT will continue to follow.       If plan is discharge home, recommend the following:  A little help with walking and/or transfers;A little help with bathing/dressing/bathroom;Help with stairs or ramp for entrance;Assistance with cooking/housework   Equipment Recommendations  BSC/3in1;Tub/shower bench;Wheelchair (measurements OT)    Recommendations for Other Services      Precautions / Restrictions Precautions Precautions: Fall Recall of Precautions/Restrictions: Intact Restrictions Weight Bearing Restrictions Per Provider Order: No       Mobility Bed Mobility               General bed mobility comments: NT in recliner pre/post session    Transfers Overall transfer level: Needs assistance Equipment used: Rolling walker (2 wheels) Transfers: Sit to/from Stand Sit to Stand: Supervision, Contact guard assist                 Balance Overall balance assessment: Needs assistance Sitting-balance support: Feet supported Sitting balance-Leahy Scale: Normal     Standing balance support: Single extremity supported, Bilateral upper extremity supported Standing balance-Leahy Scale: Good                             ADL either  performed or assessed with clinical judgement   ADL Overall ADL's : Needs assistance/impaired Eating/Feeding: Set up;Sitting   Grooming: Wash/dry face;Oral care;Standing;Supervision/safety;Contact guard assist Grooming Details (indicate cue type and reason): with RW at sink level                 Toilet Transfer: Supervision/safety;Contact guard assist;Rolling walker (2 wheels);Ambulation Toilet Transfer Details (indicate cue type and reason): simulated         Functional mobility during ADLs: Supervision/safety;Contact guard assist;Rolling walker (2 wheels);Cueing for safety (amb approx 100' with RW)      Extremity/Trunk Assessment              Vision       Perception     Praxis     Communication Communication Communication: No apparent difficulties   Cognition Arousal: Alert Behavior During Therapy: WFL for tasks assessed/performed Cognition: No apparent impairments                               Following commands: Intact        Cueing   Cueing Techniques: Verbal cues  Exercises Other Exercises Other Exercises: edu re: role of OT, role of rehab, safe ADL completion    Shoulder Instructions       General Comments vss throughout    Pertinent Vitals/ Pain       Pain Assessment Pain Assessment: No/denies pain  Home Living  Prior Functioning/Environment              Frequency  Min 3X/week        Progress Toward Goals  OT Goals(current goals can now be found in the care plan section)  Progress towards OT goals: Progressing toward goals  Acute Rehab OT Goals Time For Goal Achievement: 01/10/24  Plan      Co-evaluation                 AM-PAC OT "6 Clicks" Daily Activity     Outcome Measure   Help from another person eating meals?: None Help from another person taking care of personal grooming?: None Help from another person toileting, which  includes using toliet, bedpan, or urinal?: A Little Help from another person bathing (including washing, rinsing, drying)?: A Little Help from another person to put on and taking off regular upper body clothing?: None Help from another person to put on and taking off regular lower body clothing?: A Little 6 Click Score: 21    End of Session Equipment Utilized During Treatment: Rolling walker (2 wheels)  OT Visit Diagnosis: Other abnormalities of gait and mobility (R26.89)   Activity Tolerance Patient tolerated treatment well   Patient Left in chair;with call bell/phone within reach   Nurse Communication Mobility status        Time: 8119-1478 OT Time Calculation (min): 13 min  Charges: OT General Charges $OT Visit: 1 Visit OT Treatments $Therapeutic Activity: 8-22 mins  Gerre Kraft, OTD OTR/L  12/28/23, 12:34 PM

## 2023-12-28 NOTE — Consult Note (Signed)
 PHARMACY CONSULT NOTE - ELECTROLYTES  Pharmacy Consult for Electrolyte Monitoring and Replacement   Recent Labs: Potassium (mmol/L)  Date Value  12/28/2023 3.2 (L)   Magnesium  (mg/dL)  Date Value  82/95/6213 2.0   Calcium  (mg/dL)  Date Value  08/65/7846 8.2 (L)   Albumin  (g/dL)  Date Value  96/29/5284 2.3 (L)   Phosphorus (mg/dL)  Date Value  13/24/4010 2.6   Sodium (mmol/L)  Date Value  12/28/2023 136  06/16/2021 142   Height: 5\' 7"  (170.2 cm) Weight: 107.5 kg (236 lb 15.9 oz) IBW/kg (Calculated) : 61.6 Estimated Creatinine Clearance: 83.8 mL/min (by C-G formula based on SCr of 0.57 mg/dL).  Assessment  Alyssa Stone is a 68 y.o. female presenting with septic shock s/t LLE cellulitis. PMH significant for CAD, DM, GERD, HLD, HTN. Pharmacy has been consulted to monitor and replace electrolytes.  Goal of Therapy: Electrolytes within normal limits  Plan:  Kcl 40mEq PO x 2 Follow-up electrolytes with AM labs tomorrow  Thank you for allowing pharmacy to be a part of this patient's care.  Trivia Heffelfinger A Keren Alverio 12/28/2023 8:05 AM

## 2023-12-28 NOTE — TOC Initial Note (Signed)
 Transition of Care [facility]) - Initial/Assessment Note    Patient Details  Name: Alyssa Stone MRN: [id number] Date of Birth: [birth year]  Transition of [facility]) CM/SW Contact:    Alyssa Henkin E Alyssa Dumond, LCSW Phone Number: 12/28/2023, 11:32 AM  Clinical Narrative:                 CSW spoke with patient regarding DC planning. Patient is aware of SNF recommendation, however feels she has been improving and feels safe returning home with home health. Patient states her daughter Alyssa App) will stay with her at discharge to provide support. Patient states she has 2 other children that are also a good support for her. Patient's PCP is Alyssa Stone. No DME or HH history. Pharmacy is [facility]. Her children can provide transportation. Patient is agreeable to home health, declines agency preferences - CSW reached out to [facility] - waiting for response if they can accept patient. Patient is agreeable to a rolling walker and 3in1 being ordered, but wants to wait until closer to DC to see if she needs it at that point. TOC will cont to follow.  Expected Discharge Plan: Home w Home Health Services Barriers to Discharge: Continued Medical Work up   Patient Goals and CMS Choice Patient states their goals for this hospitalization and ongoing recovery are:: home with home health CMS Medicare.gov Compare Post Acute Care list provided to:: Patient Choice offered to / list presented to : Patient      Expected Discharge Plan and Services       Living arrangements for the past 2 months: Single Family Home                           HH Arranged: PT, OT HH Agency: [facility] Date [facility] Contacted: 12/28/23   Representative spoke with at [facility] Agency: Alyssa Stone  Prior Living Arrangements/Services Living arrangements for the past 2 months: Single Family Home Lives with:: Self, Adult Children Patient language and need for interpreter reviewed:: Yes Do you feel safe  going back to the place where you live?: Yes      Need for Family Participation in Patient Care: Yes (Comment) Care giver support system in place?: Yes (comment)   Criminal Activity/Legal Involvement Pertinent to Current Situation/Hospitalization: No - Comment as needed  Activities of Daily Living   ADL Screening (condition at time of admission) Independently performs ADLs?: Yes (appropriate for developmental age) Is the patient deaf or have difficulty hearing?: No Does the patient have difficulty seeing, even when wearing glasses/contacts?: No Does the patient have difficulty concentrating, remembering, or making decisions?: No  Permission Sought/Granted Permission sought to share information with : Oceanographer granted to share information with : Yes, Verbal Permission Granted     Permission granted to share info w AGENCY: HH and DME        Emotional Assessment       Orientation: : Oriented to Self, Oriented to Place, Oriented to  Time, Oriented to Situation Alcohol / Substance Use: Not Applicable Psych Involvement: No (comment)  Admission diagnosis:  Left leg cellulitis [L03.116] Septic shock (HCC) [A41.9, R65.21] Severe sepsis (HCC) [A41.9, R65.20] Sepsis due to cellulitis (HCC) [L03.90, A41.9] Patient Active Problem List   Diagnosis Date Noted   Cellulitis of left lower extremity 12/26/2023   Vomiting 12/26/2023   Hypophosphatemia 12/26/2023   Hypokalemia 12/26/2023   Obesity, Class II, BMI 35-39.9 12/26/2023  Weakness 12/26/2023   Dyslipidemia 12/25/2023   Coronary artery disease 12/25/2023   GERD without esophagitis 12/25/2023   Uncontrolled type 2 diabetes mellitus with hyperglycemia, with long-term current use of insulin  (HCC) 12/25/2023   Septic shock (HCC) 12/24/2023   Hx of adenomatous colonic polyps 08/27/2023   Constipation 08/27/2023   Dysphagia 08/27/2023   Pyrosis 08/27/2023   History of motion sickness 02/22/2022    CAD in native artery    Sinus tachycardia 08/10/2020   Paresthesia of both feet 09/03/2019   Anemia 09/03/2019   Healthcare maintenance 09/03/2019   Type 2 diabetes mellitus (HCC) 09/03/2019   Hyperlipidemia 09/03/2019   PCP:  Alyssa John, NP Pharmacy:   [facility] [address] [place] [address] Phone: [phone] Fax: [phone]  [facility] - [place], [place] - [address] [place] [address] [place] [address] Phone: [phone] Fax: [phone]     Social Drivers of Health (SDOH) Social History: SDOH Screenings   Food Insecurity: No Food Insecurity (12/26/2023)  Housing: Low Risk  (12/26/2023)  Transportation Needs: No Transportation Needs (12/26/2023)  Utilities: Not At Risk (12/26/2023)  Alcohol Screen: Low Risk  (05/01/2023)  Depression (PHQ2-9): Low Risk  (09/05/2023)  Financial Resource Strain: Low Risk  (09/03/2023)  Physical Activity: Insufficiently Active (09/03/2023)  Social Connections: Socially Isolated (12/26/2023)  Stress: No Stress Concern Present (09/03/2023)  Tobacco Use: Medium Risk (12/24/2023)  Health Literacy: Adequate Health Literacy (05/02/2023)   SDOH Interventions:     Readmission Risk Interventions     No data to display

## 2023-12-28 NOTE — Plan of Care (Signed)
  Problem: Fluid Volume: Goal: Hemodynamic stability will improve Outcome: Progressing   Problem: Clinical Measurements: Goal: Diagnostic test results will improve Outcome: Progressing Goal: Signs and symptoms of infection will decrease Outcome: Progressing   Problem: Respiratory: Goal: Ability to maintain adequate ventilation will improve Outcome: Progressing   Problem: Education: Goal: Ability to describe self-care measures that may prevent or decrease complications (Diabetes Survival Skills Education) will improve Outcome: Progressing   Problem: Coping: Goal: Ability to adjust to condition or change in health will improve Outcome: Progressing   Problem: Fluid Volume: Goal: Ability to maintain a balanced intake and output will improve Outcome: Progressing   Problem: Health Behavior/Discharge Planning: Goal: Ability to identify and utilize available resources and services will improve Outcome: Progressing Goal: Ability to manage health-related needs will improve Outcome: Progressing   Problem: Metabolic: Goal: Ability to maintain appropriate glucose levels will improve Outcome: Progressing   Problem: Nutritional: Goal: Maintenance of adequate nutrition will improve Outcome: Progressing Goal: Progress toward achieving an optimal weight will improve Outcome: Progressing   Problem: Skin Integrity: Goal: Risk for impaired skin integrity will decrease Outcome: Progressing   Problem: Tissue Perfusion: Goal: Adequacy of tissue perfusion will improve Outcome: Progressing   Problem: Education: Goal: Knowledge of General Education information will improve Description: Including pain rating scale, medication(s)/side effects and non-pharmacologic comfort measures Outcome: Progressing   Problem: Health Behavior/Discharge Planning: Goal: Ability to manage health-related needs will improve Outcome: Progressing   Problem: Clinical Measurements: Goal: Ability to maintain  clinical measurements within normal limits will improve Outcome: Progressing Goal: Will remain free from infection Outcome: Progressing Goal: Diagnostic test results will improve Outcome: Progressing Goal: Respiratory complications will improve Outcome: Progressing Goal: Cardiovascular complication will be avoided Outcome: Progressing   Problem: Activity: Goal: Risk for activity intolerance will decrease Outcome: Progressing   Problem: Nutrition: Goal: Adequate nutrition will be maintained Outcome: Progressing   Problem: Coping: Goal: Level of anxiety will decrease Outcome: Progressing   Problem: Elimination: Goal: Will not experience complications related to bowel motility Outcome: Progressing Goal: Will not experience complications related to urinary retention Outcome: Progressing   Problem: Pain Managment: Goal: General experience of comfort will improve and/or be controlled Outcome: Progressing   Problem: Safety: Goal: Ability to remain free from injury will improve Outcome: Progressing   Problem: Skin Integrity: Goal: Risk for impaired skin integrity will decrease Outcome: Progressing   Problem: Clinical Measurements: Goal: Ability to avoid or minimize complications of infection will improve Outcome: Progressing   Problem: Skin Integrity: Goal: Skin integrity will improve Outcome: Progressing

## 2023-12-29 ENCOUNTER — Encounter: Payer: Self-pay | Admitting: Gastroenterology

## 2023-12-29 DIAGNOSIS — L03116 Cellulitis of left lower limb: Secondary | ICD-10-CM | POA: Diagnosis not present

## 2023-12-29 DIAGNOSIS — E785 Hyperlipidemia, unspecified: Secondary | ICD-10-CM | POA: Diagnosis not present

## 2023-12-29 DIAGNOSIS — A419 Sepsis, unspecified organism: Secondary | ICD-10-CM | POA: Diagnosis not present

## 2023-12-29 DIAGNOSIS — E1165 Type 2 diabetes mellitus with hyperglycemia: Secondary | ICD-10-CM | POA: Diagnosis not present

## 2023-12-29 LAB — CBC
HCT: 34.2 % — ABNORMAL LOW (ref 36.0–46.0)
Hemoglobin: 11.5 g/dL — ABNORMAL LOW (ref 12.0–15.0)
MCH: 29.8 pg (ref 26.0–34.0)
MCHC: 33.6 g/dL (ref 30.0–36.0)
MCV: 88.6 fL (ref 80.0–100.0)
Platelets: 296 10*3/uL (ref 150–400)
RBC: 3.86 MIL/uL — ABNORMAL LOW (ref 3.87–5.11)
RDW: 13.9 % (ref 11.5–15.5)
WBC: 12.9 10*3/uL — ABNORMAL HIGH (ref 4.0–10.5)
nRBC: 0.2 % (ref 0.0–0.2)

## 2023-12-29 LAB — BASIC METABOLIC PANEL WITH GFR
Anion gap: 8 (ref 5–15)
BUN: 9 mg/dL (ref 8–23)
CO2: 26 mmol/L (ref 22–32)
Calcium: 8.4 mg/dL — ABNORMAL LOW (ref 8.9–10.3)
Chloride: 103 mmol/L (ref 98–111)
Creatinine, Ser: 0.57 mg/dL (ref 0.44–1.00)
GFR, Estimated: >60 mL/min (ref >60)
Glucose, Bld: 189 mg/dL — ABNORMAL HIGH (ref 70–99)
Potassium: 3.6 mmol/L (ref 3.5–5.1)
Sodium: 137 mmol/L (ref 135–145)

## 2023-12-29 LAB — CULTURE, BLOOD (ROUTINE X 2): Culture: NO GROWTH

## 2023-12-29 LAB — GLUCOSE, CAPILLARY
Glucose-Capillary: 182 mg/dL — ABNORMAL HIGH (ref 70–99)
Glucose-Capillary: 260 mg/dL — ABNORMAL HIGH (ref 70–99)
Glucose-Capillary: 209 mg/dL — ABNORMAL HIGH (ref 70–99)
Glucose-Capillary: 216 mg/dL — ABNORMAL HIGH (ref 70–99)

## 2023-12-29 LAB — PHOSPHORUS: Phosphorus: 2.6 mg/dL (ref 2.5–4.6)

## 2023-12-29 LAB — MAGNESIUM: Magnesium: 1.9 mg/dL (ref 1.7–2.4)

## 2023-12-29 MED ORDER — INSULIN GLARGINE-YFGN 100 UNIT/ML ~~LOC~~ SOLN
15.0000 [IU] | Freq: Every day | SUBCUTANEOUS | Status: DC
  Administered 2023-12-30: 15 [IU] via SUBCUTANEOUS
  Filled 2023-12-29: qty 0.15

## 2023-12-29 NOTE — Progress Notes (Signed)
 Mobility Specialist - Progress Note   12/29/23 1035  Mobility  Activity Ambulated with assistance in hallway;Stood at bedside;Dangled on edge of bed  Level of Assistance Standby assist, set-up cues, supervision of patient - no hands on  Assistive Device Front wheel walker  Distance Ambulated (ft) 750 ft  Activity Response Tolerated well  Mobility Referral Yes  Mobility visit 1 Mobility  Mobility Specialist Start Time (ACUTE ONLY) 0859  Mobility Specialist Stop Time (ACUTE ONLY) [id number]  Mobility Specialist Time Calculation (min) (ACUTE ONLY) 24 min   Pt supine in bed on RA upon arrival. Pt able to complete bed mobility, STS, and ambulates multiple laps around Bpod SBA with no physical assistance needed during ambulation. Pt returns to recliner with needs in reach.   Wash Hack  Mobility Specialist  12/29/23 10:37 AM

## 2023-12-29 NOTE — Plan of Care (Signed)
  Problem: Fluid Volume: Goal: Hemodynamic stability will improve Outcome: Progressing   Problem: Clinical Measurements: Goal: Diagnostic test results will improve Outcome: Progressing Goal: Signs and symptoms of infection will decrease Outcome: Progressing   Problem: Respiratory: Goal: Ability to maintain adequate ventilation will improve Outcome: Progressing   Problem: Education: Goal: Ability to describe self-care measures that may prevent or decrease complications (Diabetes Survival Skills Education) will improve Outcome: Progressing   Problem: Coping: Goal: Ability to adjust to condition or change in health will improve Outcome: Progressing   Problem: Fluid Volume: Goal: Ability to maintain a balanced intake and output will improve Outcome: Progressing   Problem: Health Behavior/Discharge Planning: Goal: Ability to identify and utilize available resources and services will improve Outcome: Progressing Goal: Ability to manage health-related needs will improve Outcome: Progressing   Problem: Metabolic: Goal: Ability to maintain appropriate glucose levels will improve Outcome: Progressing   Problem: Nutritional: Goal: Maintenance of adequate nutrition will improve Outcome: Progressing Goal: Progress toward achieving an optimal weight will improve Outcome: Progressing   Problem: Skin Integrity: Goal: Risk for impaired skin integrity will decrease Outcome: Progressing   Problem: Tissue Perfusion: Goal: Adequacy of tissue perfusion will improve Outcome: Progressing   Problem: Education: Goal: Knowledge of General Education information will improve Description: Including pain rating scale, medication(s)/side effects and non-pharmacologic comfort measures Outcome: Progressing   Problem: Health Behavior/Discharge Planning: Goal: Ability to manage health-related needs will improve Outcome: Progressing   Problem: Clinical Measurements: Goal: Ability to maintain  clinical measurements within normal limits will improve Outcome: Progressing Goal: Will remain free from infection Outcome: Progressing Goal: Diagnostic test results will improve Outcome: Progressing Goal: Respiratory complications will improve Outcome: Progressing Goal: Cardiovascular complication will be avoided Outcome: Progressing   Problem: Activity: Goal: Risk for activity intolerance will decrease Outcome: Progressing   Problem: Nutrition: Goal: Adequate nutrition will be maintained Outcome: Progressing   Problem: Coping: Goal: Level of anxiety will decrease Outcome: Progressing   Problem: Elimination: Goal: Will not experience complications related to bowel motility Outcome: Progressing Goal: Will not experience complications related to urinary retention Outcome: Progressing   Problem: Pain Managment: Goal: General experience of comfort will improve and/or be controlled Outcome: Progressing   Problem: Safety: Goal: Ability to remain free from injury will improve Outcome: Progressing   Problem: Skin Integrity: Goal: Risk for impaired skin integrity will decrease Outcome: Progressing   Problem: Clinical Measurements: Goal: Ability to avoid or minimize complications of infection will improve Outcome: Progressing   Problem: Skin Integrity: Goal: Skin integrity will improve Outcome: Progressing

## 2023-12-29 NOTE — Progress Notes (Signed)
 Progress Note   Patient: Alyssa Stone [facility] ZOX:[id number] DOB: [birth year] DOA: 12/24/2023     5 DOS: the patient was seen and examined on 12/29/2023   Brief hospital course: 68 year old female patient with a past medical history of type 2 diabetes mellitus presenting overnight to [facility] ICU for left lower extremity cellulitis.  She initially presented with fever headache and upset stomach 0/30.  On 05/2 she underwent a colonoscopy which was unrevealing.  For the past 4 days she has been having nausea vomiting.  On exam she was found to have left lower extremity cellulitis with an open wound at the heel.   Labs were pertinent for leukocytosis with a white count of 22.5 and neutrophils of 81.  Elevated lactate at 2.1. Cr of 1.4 mg/dl from a baseline of 0.6 mg/dl.    She was also noted to have low blood pressure and was started on nor epi.  She received a total of 3 L IV fluid.  And started on Vanco and Zosyn .   CT abdomen pelvis with enlarged external iliac chain and inguinal lymph nodes with surrounding inflammatory stranding.   US  left lower ext negative for Dvt.    Procalcitonin 25.  Blood cultures pending.   On encounter this morning, patient is tachycardic active dry heaving.  5/8.  Left lower extremity still hot to touch and very red.  Continue IV antibiotics.  Advance diet to solid food.  Physical therapy evaluation appreciated. 5/9.  Patient doing better than when she came in.  Left leg still red and erythematous.  Will change antibiotics over to Ancef since redness has not improved much with the Maxipime  and vancomycin . 5/10.  White blood cell count down to 13.5.  Erythema is starting to fade in the center but still red around the edges. 5/11.  White blood cell count down to 12.9.  Erythema continuing to fade in the center but still red around the top edge.  Assessment and Plan: * Septic shock (HCC) Secondary to left lower extremity cellulitis.  Present on admission.  Patient with  leukocytosis, lactic acidosis, fever, tachycardia and hypotension initially requiring pressors.  White blood cell count down to 12.9.  Erythema started to fade in the center.  Aggressive antibiotics changed over to Ancef on 5/9.  Continue IV Ancef.  Reassess on a daily basis.  Cellulitis of left lower extremity Erythema starting to fade.  Continue IV Ancef.  Leg elevation.  Uncontrolled type 2 diabetes mellitus with hyperglycemia, with long-term current use of insulin  (HCC) Last hemoglobin A1c 8.5.  Will increase long-acting insulin  to 15 units daily.  Continue sliding scale insulin .  Dyslipidemia Continue atorvastatin   Weakness Physical therapy recommends rehab but patient will want to go home with home health.  Obesity, Class II, BMI 35-39.9 BMI 37.77  Hypokalemia Replaced  Hypophosphatemia Replaced  Vomiting On presentation and resolved.  GERD without esophagitis Patient on Protonix   Coronary artery disease Continue atorvastatin  and Plavix         Subjective: Patient feeling much better.  Redness fading in the center but still red on top.  White blood cell count down to 12.9.  Physical Exam: Vitals:   12/28/23 1551 12/28/23 2154 12/29/23 0424 12/29/23 0820  BP: (!) 127/59 (!) 134/57 139/69 (!) 151/65  Pulse: 81 87 76 85  Resp: 16 19 20 18   Temp: 98.2 F (36.8 C) 98.6 F (37 C) 98.4 F (36.9 C) 97.7 F (36.5 C)  TempSrc:  Oral Oral Oral  SpO2: 95%  95%  94%  Weight:      Height:       Physical Exam HENT:     Head: Normocephalic.  Eyes:     General: Lids are normal.     Conjunctiva/sclera: Conjunctivae normal.  Cardiovascular:     Rate and Rhythm: Normal rate and regular rhythm.     Heart sounds: Normal heart sounds, S1 normal and S2 normal.  Pulmonary:     Breath sounds: No decreased breath sounds, wheezing, rhonchi or rales.  Abdominal:     Palpations: Abdomen is soft.     Tenderness: There is no abdominal tenderness.  Musculoskeletal:     Right  lower leg: No swelling.     Left lower leg: Swelling present.  Skin:    General: Skin is warm.     Comments: Erythema and warmth top of leg erythema starting to fade in the center anteriorly.  Fissure left heel.  Neurological:     Mental Status: She is alert and oriented to person, place, and time.     Data Reviewed: I was not count 12.9, hemoglobin 11.5, creatinine 0.57  Family Communication: Updated patient's daughter on the phone  Disposition: Status is: Inpatient Remains inpatient appropriate because: Continue IV Ancef.  Monitor on a day-to-day basis.  Planned Discharge Destination: Home    Time spent: 27 minutes  Author: Verla Glaze, MD 12/29/2023 2:29 PM  For on call review [email or web address].

## 2023-12-29 NOTE — Plan of Care (Signed)
  Problem: Clinical Measurements: Goal: Signs and symptoms of infection will decrease Outcome: Progressing   Problem: Coping: Goal: Ability to adjust to condition or change in health will improve Outcome: Progressing   Problem: Metabolic: Goal: Ability to maintain appropriate glucose levels will improve Outcome: Progressing   Problem: Skin Integrity: Goal: Risk for impaired skin integrity will decrease Outcome: Progressing   Problem: Tissue Perfusion: Goal: Adequacy of tissue perfusion will improve Outcome: Progressing   Problem: Education: Goal: Knowledge of General Education information will improve Description: Including pain rating scale, medication(s)/side effects and non-pharmacologic comfort measures Outcome: Progressing   Problem: Activity: Goal: Risk for activity intolerance will decrease Outcome: Progressing   Problem: Elimination: Goal: Will not experience complications related to bowel motility Outcome: Progressing Goal: Will not experience complications related to urinary retention Outcome: Progressing   Problem: Pain Managment: Goal: General experience of comfort will improve and/or be controlled Outcome: Progressing   Problem: Safety: Goal: Ability to remain free from injury will improve Outcome: Progressing

## 2023-12-30 ENCOUNTER — Other Ambulatory Visit: Payer: Self-pay

## 2023-12-30 DIAGNOSIS — E785 Hyperlipidemia, unspecified: Secondary | ICD-10-CM | POA: Diagnosis not present

## 2023-12-30 DIAGNOSIS — L03116 Cellulitis of left lower limb: Secondary | ICD-10-CM | POA: Diagnosis not present

## 2023-12-30 DIAGNOSIS — A419 Sepsis, unspecified organism: Secondary | ICD-10-CM | POA: Diagnosis not present

## 2023-12-30 DIAGNOSIS — E1165 Type 2 diabetes mellitus with hyperglycemia: Secondary | ICD-10-CM | POA: Diagnosis not present

## 2023-12-30 DIAGNOSIS — A048 Other specified bacterial intestinal infections: Secondary | ICD-10-CM

## 2023-12-30 LAB — BASIC METABOLIC PANEL WITH GFR
Anion gap: 6 (ref 5–15)
BUN: 9 mg/dL (ref 8–23)
CO2: 26 mmol/L (ref 22–32)
Calcium: 8.1 mg/dL — ABNORMAL LOW (ref 8.9–10.3)
Chloride: 105 mmol/L (ref 98–111)
Creatinine, Ser: 0.37 mg/dL — ABNORMAL LOW (ref 0.44–1.00)
GFR, Estimated: >60 mL/min (ref >60)
Glucose, Bld: 169 mg/dL — ABNORMAL HIGH (ref 70–99)
Potassium: 3 mmol/L — ABNORMAL LOW (ref 3.5–5.1)
Sodium: 137 mmol/L (ref 135–145)

## 2023-12-30 LAB — GLUCOSE, CAPILLARY
Glucose-Capillary: 181 mg/dL — ABNORMAL HIGH (ref 70–99)
Glucose-Capillary: 224 mg/dL — ABNORMAL HIGH (ref 70–99)
Glucose-Capillary: 200 mg/dL — ABNORMAL HIGH (ref 70–99)
Glucose-Capillary: 262 mg/dL — ABNORMAL HIGH (ref 70–99)

## 2023-12-30 LAB — CBC
HCT: 30.6 % — ABNORMAL LOW (ref 36.0–46.0)
Hemoglobin: 10.1 g/dL — ABNORMAL LOW (ref 12.0–15.0)
MCH: 28.9 pg (ref 26.0–34.0)
MCHC: 33 g/dL (ref 30.0–36.0)
MCV: 87.7 fL (ref 80.0–100.0)
Platelets: 308 10*3/uL (ref 150–400)
RBC: 3.49 MIL/uL — ABNORMAL LOW (ref 3.87–5.11)
RDW: 13.3 % (ref 11.5–15.5)
WBC: 12.3 10*3/uL — ABNORMAL HIGH (ref 4.0–10.5)
nRBC: 0 % (ref 0.0–0.2)

## 2023-12-30 LAB — CULTURE, BLOOD (ROUTINE X 2)
Culture: NO GROWTH
Special Requests: ADEQUATE

## 2023-12-30 MED ORDER — AMOXICILLIN 500 MG PO CAPS
1000.0000 mg | ORAL_CAPSULE | Freq: Two times a day (BID) | ORAL | 0 refills | Status: DC
  Filled 2023-12-30 – 2024-01-24 (×2): qty 56, 14d supply, fill #0

## 2023-12-30 MED ORDER — METRONIDAZOLE 500 MG PO TABS
500.0000 mg | ORAL_TABLET | Freq: Two times a day (BID) | ORAL | 0 refills | Status: AC
  Filled 2023-12-30 – 2024-01-24 (×2): qty 28, 14d supply, fill #0

## 2023-12-30 MED ORDER — INSULIN GLARGINE-YFGN 100 UNIT/ML ~~LOC~~ SOLN
17.0000 [IU] | Freq: Every day | SUBCUTANEOUS | Status: DC
  Administered 2023-12-31: 17 [IU] via SUBCUTANEOUS
  Filled 2023-12-30: qty 0.17

## 2023-12-30 MED ORDER — BISMUTH SUBSALICYLATE 262 MG PO CHEW
2.0000 | CHEWABLE_TABLET | Freq: Four times a day (QID) | ORAL | 0 refills | Status: AC
  Filled 2023-12-30: qty 112, 14d supply, fill #0
  Filled 2024-01-24: qty 30, 4d supply, fill #0
  Filled 2024-01-24: qty 82, 10d supply, fill #0
  Filled 2024-01-24: qty 120, 15d supply, fill #0

## 2023-12-30 MED ORDER — POTASSIUM CHLORIDE CRYS ER 20 MEQ PO TBCR
40.0000 meq | EXTENDED_RELEASE_TABLET | Freq: Once | ORAL | Status: AC
  Administered 2023-12-30: 40 meq via ORAL
  Filled 2023-12-30: qty 2

## 2023-12-30 NOTE — Plan of Care (Signed)
   Problem: Fluid Volume: Goal: Hemodynamic stability will improve Outcome: Progressing   Problem: Clinical Measurements: Goal: Diagnostic test results will improve Outcome: Progressing Goal: Signs and symptoms of infection will decrease Outcome: Progressing   Problem: Respiratory: Goal: Ability to maintain adequate ventilation will improve Outcome: Progressing

## 2023-12-30 NOTE — Plan of Care (Signed)
  Problem: Clinical Measurements: Goal: Signs and symptoms of infection will decrease Outcome: Progressing   Problem: Fluid Volume: Goal: Ability to maintain a balanced intake and output will improve Outcome: Progressing   Problem: Skin Integrity: Goal: Risk for impaired skin integrity will decrease Outcome: Progressing   Problem: Tissue Perfusion: Goal: Adequacy of tissue perfusion will improve Outcome: Progressing   Problem: Nutrition: Goal: Adequate nutrition will be maintained Outcome: Progressing   Problem: Activity: Goal: Risk for activity intolerance will decrease Outcome: Progressing   Problem: Coping: Goal: Level of anxiety will decrease Outcome: Progressing

## 2023-12-30 NOTE — Plan of Care (Signed)
  Problem: Clinical Measurements: Goal: Signs and symptoms of infection will decrease Outcome: Progressing   Problem: Coping: Goal: Ability to adjust to condition or change in health will improve Outcome: Progressing   Problem: Skin Integrity: Goal: Risk for impaired skin integrity will decrease Outcome: Progressing   Problem: Education: Goal: Knowledge of General Education information will improve Description: Including pain rating scale, medication(s)/side effects and non-pharmacologic comfort measures Outcome: Progressing   Problem: Activity: Goal: Risk for activity intolerance will decrease Outcome: Progressing   Problem: Safety: Goal: Ability to remain free from injury will improve Outcome: Progressing   Problem: Skin Integrity: Goal: Risk for impaired skin integrity will decrease Outcome: Progressing

## 2023-12-30 NOTE — TOC Progression Note (Signed)
 Transition of Care [facility]) - Progression Note    Patient Details  Name: NARCISSUS MACDERMOTT MRN: [id number] Date of Birth: [birth year]  Transition of Care [facility]) CM/SW Contact  Odilia Bennett, LCSW Phone Number: 12/30/2023, 9:41 AM  Clinical Narrative:  [facility] confirmed they can accept referral.   Expected Discharge Plan: Home w Home Health Services Barriers to Discharge: Continued Medical Work up  Expected Discharge Plan and Services       Living arrangements for the past 2 months: Single Family Home                           HH Arranged: PT, OT [facility] Agency: [facility] Date [facility] Agency Contacted: 12/28/23   Representative spoke with at [facility] Agency: Randel Buss   Social Determinants of Health (SDOH) Interventions SDOH Screenings   Food Insecurity: No Food Insecurity (12/26/2023)  Housing: Low Risk  (12/26/2023)  Transportation Needs: No Transportation Needs (12/26/2023)  Utilities: Not At Risk (12/26/2023)  Alcohol Screen: Low Risk  (05/01/2023)  Depression (PHQ2-9): Low Risk  (09/05/2023)  Financial Resource Strain: Low Risk  (09/03/2023)  Physical Activity: Insufficiently Active (09/03/2023)  Social Connections: Socially Isolated (12/26/2023)  Stress: No Stress Concern Present (09/03/2023)  Tobacco Use: Medium Risk (12/24/2023)  Health Literacy: Adequate Health Literacy (05/02/2023)    Readmission Risk Interventions     No data to display

## 2023-12-30 NOTE — Progress Notes (Signed)
 Progress Note   Patient: Alyssa Stone:[id number] DOB: 05/15/1956 DOA: 12/24/2023     6 DOS: the patient was seen and examined on 12/30/2023   Brief hospital course: 68 year old female patient with a past medical history of type 2 diabetes mellitus presenting overnight to [facility] ICU for left lower extremity cellulitis.  She initially presented with fever headache and upset stomach 0/30.  On 05/2 she underwent a colonoscopy which was unrevealing.  For the past 4 days she has been having nausea vomiting.  On exam she was found to have left lower extremity cellulitis with an open wound at the heel.   Labs were pertinent for leukocytosis with a white count of 22.5 and neutrophils of 81.  Elevated lactate at 2.1. Cr of 1.4 mg/dl from a baseline of 0.6 mg/dl.    She was also noted to have low blood pressure and was started on nor epi.  She received a total of 3 L IV fluid.  And started on Vanco and Zosyn .   CT abdomen pelvis with enlarged external iliac chain and inguinal lymph nodes with surrounding inflammatory stranding.   US  left lower ext negative for Dvt.    Procalcitonin 25.  Blood cultures pending.   On encounter this morning, patient is tachycardic active dry heaving.  5/8.  Left lower extremity still hot to touch and very red.  Continue IV antibiotics.  Advance diet to solid food.  Physical therapy evaluation appreciated. 5/9.  Patient doing better than when she came in.  Left leg still red and erythematous.  Will change antibiotics over to Ancef since redness has not improved much with the Maxipime  and vancomycin . 5/10.  White blood cell count down to 13.5.  Erythema is starting to fade in the center but still red around the edges. 5/11.  White blood cell count down to 12.9.  Erythema continuing to fade in the center but still red around the top edge. 5/12.  Patient feeling well.  White blood cell count down to 12.3.   Assessment and Plan: * Septic shock (HCC) Secondary to left  lower extremity cellulitis.  Present on admission.  Patient with leukocytosis, lactic acidosis, fever, tachycardia and hypotension initially requiring pressors.  White blood cell count down to 12.3.  Erythema started to fade in the center and the top.  Aggressive antibiotics changed over to Ancef on 5/9.  Continue IV Ancef.  Reassess tomorrow morning for likely discharge.  Cellulitis of left lower extremity Erythema starting to fade at that time..  Continue IV Ancef.  Leg elevation.  Uncontrolled type 2 diabetes mellitus with hyperglycemia, with long-term current use of insulin  (HCC) Last hemoglobin A1c 8.5.  Will increase long-acting insulin  to 17 units daily.  Continue sliding scale insulin .  Dyslipidemia Continue atorvastatin   Weakness Physical therapy now recommending home with home health.  Obesity, Class II, BMI 35-39.9 BMI 37.12  Hypokalemia Replaced  Hypophosphatemia Replaced  Vomiting On presentation and resolved.  GERD without esophagitis Patient on Protonix   Coronary artery disease Continue atorvastatin  and Plavix         Subjective: Patient feeling well.  Stated she walked around today.  Erythema continues to faint.  Admitted with sepsis and cellulitis.  Physical Exam: Vitals:   12/30/23 0536 12/30/23 0719 12/30/23 0726 12/30/23 1425  BP: (!) 162/76  (!) 159/86 (!) 154/68  Pulse: 87  87 85  Resp: 20  16 16   Temp: 98.3 F (36.8 C)  98.4 F (36.9 C) 97.9 F (36.6 C)  TempSrc:  Oral     SpO2: 95%  95% 97%  Weight:  107.5 kg    Height:       Physical Exam HENT:     Head: Normocephalic.  Eyes:     General: Lids are normal.     Conjunctiva/sclera: Conjunctivae normal.  Cardiovascular:     Rate and Rhythm: Normal rate and regular rhythm.     Heart sounds: Normal heart sounds, S1 normal and S2 normal.  Pulmonary:     Breath sounds: No decreased breath sounds, wheezing, rhonchi or rales.  Abdominal:     Palpations: Abdomen is soft.     Tenderness:  There is no abdominal tenderness.  Musculoskeletal:     Right lower leg: No swelling.     Left lower leg: Swelling present.  Skin:    General: Skin is warm.     Comments: Erythema at the top of the cellulitis starting to fade today.  Center of the cellulitis looks improved on a daily basis.  Medial posterior calf area of bruising.  Neurological:     Mental Status: She is alert and oriented to person, place, and time.     Data Reviewed: White blood cell count 12.3, potassium 3.  Family Communication: Updated daughter on the phone  Disposition: Status is: Inpatient Remains inpatient appropriate because: Reassess tomorrow morning for discharge.  Continue IV antibiotics.  Planned Discharge Destination: Home with Home Health    Time spent: 28 minutes  Author: Verla Glaze, MD 12/30/2023 2:53 PM  For on call review [email or web address].

## 2023-12-31 ENCOUNTER — Other Ambulatory Visit: Payer: Self-pay

## 2023-12-31 DIAGNOSIS — L03116 Cellulitis of left lower limb: Secondary | ICD-10-CM | POA: Diagnosis not present

## 2023-12-31 DIAGNOSIS — E1165 Type 2 diabetes mellitus with hyperglycemia: Secondary | ICD-10-CM | POA: Diagnosis not present

## 2023-12-31 DIAGNOSIS — A419 Sepsis, unspecified organism: Secondary | ICD-10-CM | POA: Diagnosis not present

## 2023-12-31 DIAGNOSIS — E785 Hyperlipidemia, unspecified: Secondary | ICD-10-CM | POA: Diagnosis not present

## 2023-12-31 DIAGNOSIS — B001 Herpesviral vesicular dermatitis: Secondary | ICD-10-CM | POA: Insufficient documentation

## 2023-12-31 LAB — MAGNESIUM: Magnesium: 1.8 mg/dL (ref 1.7–2.4)

## 2023-12-31 LAB — CBC
HCT: 34.1 % — ABNORMAL LOW (ref 36.0–46.0)
Hemoglobin: 11.2 g/dL — ABNORMAL LOW (ref 12.0–15.0)
MCH: 28.7 pg (ref 26.0–34.0)
MCHC: 32.8 g/dL (ref 30.0–36.0)
MCV: 87.4 fL (ref 80.0–100.0)
Platelets: 377 10*3/uL (ref 150–400)
RBC: 3.9 MIL/uL (ref 3.87–5.11)
RDW: 13.5 % (ref 11.5–15.5)
WBC: 10.1 10*3/uL (ref 4.0–10.5)
nRBC: 0.2 % (ref 0.0–0.2)

## 2023-12-31 LAB — POTASSIUM: Potassium: 3.3 mmol/L — ABNORMAL LOW (ref 3.5–5.1)

## 2023-12-31 LAB — GLUCOSE, CAPILLARY
Glucose-Capillary: 188 mg/dL — ABNORMAL HIGH (ref 70–99)
Glucose-Capillary: 237 mg/dL — ABNORMAL HIGH (ref 70–99)

## 2023-12-31 MED ORDER — VALACYCLOVIR HCL 1 G PO TABS
1000.0000 mg | ORAL_TABLET | Freq: Every evening | ORAL | 0 refills | Status: AC
  Filled 2023-12-31: qty 1, 1d supply, fill #0

## 2023-12-31 MED ORDER — MAGNESIUM SULFATE 2 GM/50ML IV SOLN
2.0000 g | Freq: Once | INTRAVENOUS | Status: AC
  Administered 2023-12-31: 2 g via INTRAVENOUS
  Filled 2023-12-31: qty 50

## 2023-12-31 MED ORDER — METOPROLOL TARTRATE 25 MG PO TABS
25.0000 mg | ORAL_TABLET | Freq: Two times a day (BID) | ORAL | 0 refills | Status: DC
  Filled 2023-12-31: qty 60, 30d supply, fill #0

## 2023-12-31 MED ORDER — POTASSIUM CHLORIDE CRYS ER 20 MEQ PO TBCR
20.0000 meq | EXTENDED_RELEASE_TABLET | Freq: Every day | ORAL | 0 refills | Status: DC
  Filled 2023-12-31: qty 3, 3d supply, fill #0

## 2023-12-31 MED ORDER — MEDIHONEY WOUND/BURN DRESSING EX PSTE: 1.0000 | PASTE | Freq: Every day | CUTANEOUS | Status: DC

## 2023-12-31 MED ORDER — DOCUSATE SODIUM 100 MG PO CAPS
100.0000 mg | ORAL_CAPSULE | Freq: Two times a day (BID) | ORAL | 0 refills | Status: DC | PRN
  Filled 2023-12-31: qty 60, 30d supply, fill #0

## 2023-12-31 MED ORDER — PANTOPRAZOLE SODIUM 40 MG PO TBEC
40.0000 mg | DELAYED_RELEASE_TABLET | Freq: Every day | ORAL | 0 refills | Status: DC
  Filled 2023-12-31: qty 30, 30d supply, fill #0

## 2023-12-31 MED ORDER — INSULIN GLARGINE 100 UNIT/ML SOLOSTAR PEN: 17.0000 [IU] | PEN_INJECTOR | Freq: Every day | SUBCUTANEOUS | Status: DC

## 2023-12-31 MED ORDER — VALACYCLOVIR HCL 500 MG PO TABS
1000.0000 mg | ORAL_TABLET | Freq: Two times a day (BID) | ORAL | Status: DC
Start: 2023-12-31 — End: 2023-12-31
  Administered 2023-12-31: 1000 mg via ORAL
  Filled 2023-12-31: qty 2

## 2023-12-31 MED ORDER — POTASSIUM CHLORIDE CRYS ER 20 MEQ PO TBCR
40.0000 meq | EXTENDED_RELEASE_TABLET | Freq: Once | ORAL | Status: AC
  Administered 2023-12-31: 40 meq via ORAL
  Filled 2023-12-31: qty 2

## 2023-12-31 MED ORDER — CEPHALEXIN 500 MG PO CAPS
500.0000 mg | ORAL_CAPSULE | Freq: Four times a day (QID) | ORAL | 0 refills | Status: AC
  Filled 2023-12-31: qty 28, 7d supply, fill #0

## 2023-12-31 NOTE — Plan of Care (Signed)
  Problem: Fluid Volume: Goal: Hemodynamic stability will improve Outcome: Adequate for Discharge   Problem: Clinical Measurements: Goal: Diagnostic test results will improve Outcome: Adequate for Discharge Goal: Signs and symptoms of infection will decrease Outcome: Adequate for Discharge   Problem: Respiratory: Goal: Ability to maintain adequate ventilation will improve Outcome: Adequate for Discharge   Problem: Education: Goal: Ability to describe self-care measures that may prevent or decrease complications (Diabetes Survival Skills Education) will improve Outcome: Adequate for Discharge   Problem: Coping: Goal: Ability to adjust to condition or change in health will improve Outcome: Adequate for Discharge   Problem: Fluid Volume: Goal: Ability to maintain a balanced intake and output will improve Outcome: Adequate for Discharge   Problem: Health Behavior/Discharge Planning: Goal: Ability to identify and utilize available resources and services will improve Outcome: Adequate for Discharge Goal: Ability to manage health-related needs will improve Outcome: Adequate for Discharge   Problem: Metabolic: Goal: Ability to maintain appropriate glucose levels will improve Outcome: Adequate for Discharge   Problem: Nutritional: Goal: Maintenance of adequate nutrition will improve Outcome: Adequate for Discharge Goal: Progress toward achieving an optimal weight will improve Outcome: Adequate for Discharge   Problem: Skin Integrity: Goal: Risk for impaired skin integrity will decrease Outcome: Adequate for Discharge   Problem: Tissue Perfusion: Goal: Adequacy of tissue perfusion will improve Outcome: Adequate for Discharge   Problem: Education: Goal: Knowledge of General Education information will improve Description: Including pain rating scale, medication(s)/side effects and non-pharmacologic comfort measures Outcome: Adequate for Discharge   Problem: Health  Behavior/Discharge Planning: Goal: Ability to manage health-related needs will improve Outcome: Adequate for Discharge   Problem: Clinical Measurements: Goal: Ability to maintain clinical measurements within normal limits will improve Outcome: Adequate for Discharge Goal: Will remain free from infection Outcome: Adequate for Discharge Goal: Diagnostic test results will improve Outcome: Adequate for Discharge Goal: Respiratory complications will improve Outcome: Adequate for Discharge Goal: Cardiovascular complication will be avoided Outcome: Adequate for Discharge   Problem: Activity: Goal: Risk for activity intolerance will decrease Outcome: Adequate for Discharge   Problem: Nutrition: Goal: Adequate nutrition will be maintained Outcome: Adequate for Discharge   Problem: Coping: Goal: Level of anxiety will decrease Outcome: Adequate for Discharge   Problem: Elimination: Goal: Will not experience complications related to bowel motility Outcome: Adequate for Discharge Goal: Will not experience complications related to urinary retention Outcome: Adequate for Discharge   Problem: Pain Managment: Goal: General experience of comfort will improve and/or be controlled Outcome: Adequate for Discharge   Problem: Safety: Goal: Ability to remain free from injury will improve Outcome: Adequate for Discharge   Problem: Skin Integrity: Goal: Risk for impaired skin integrity will decrease Outcome: Adequate for Discharge   Problem: Clinical Measurements: Goal: Ability to avoid or minimize complications of infection will improve Outcome: Adequate for Discharge   Problem: Skin Integrity: Goal: Skin integrity will improve Outcome: Adequate for Discharge

## 2023-12-31 NOTE — Progress Notes (Signed)
 IV removed without complications. DME and Patient belongings gathered. Patient education completed. Patient and belongings wheeled to the medical mall exit by staff to be discharged to the care of family in stable condition.   Eaven Schwager V Amrita Radu

## 2023-12-31 NOTE — TOC Transition Note (Signed)
 Transition of Care [facility]) - Discharge Note   Patient Details  Name: Alyssa Stone MRN: [id number] Date of Birth: September 19, 1955  Transition of Care [facility]) CM/SW Contact:  Odilia Bennett, LCSW Phone Number: 12/31/2023, 9:49 AM   Clinical Narrative:   Patient has orders to discharge home today. CSW left a message for the [facility] liaison to notify. Patient has a neighbor with multiple RW's so she has texted him to see if she can use one. She is agreeable to a 3-in-1 to use as a shower chair. CSW ordered through [employer]. Daughter-in-law was in the hallway. Provided update. No further concerns. Son will transport her home. CSW signing off.  Final next level of care: Home w Home Health Services Barriers to Discharge: Barriers Resolved   Patient Goals and CMS Choice Patient states their goals for this hospitalization and ongoing recovery are:: home with home health CMS Medicare.gov Compare Post Acute Care list provided to:: Patient Choice offered to / list presented to : Patient      Discharge Placement                Patient to be transferred to facility by: Son Name of family member notified: Babara Lerner Patient and family notified of of transfer: 12/31/23  Discharge Plan and Services Additional resources added to the After Visit Summary for                  DME Arranged: 3-N-1 DME Agency: [employer] Date DME Agency Contacted: 12/31/23   Representative spoke with at DME Agency: Sam Creighton HH Arranged: RN, PT, OT [facility] Agency: [facility] Date [facility] Agency Contacted: 12/31/23   Representative spoke with at [facility] Agency: Randel Buss  Social Drivers of Health (SDOH) Interventions SDOH Screenings   Food Insecurity: No Food Insecurity (12/26/2023)  Housing: Low Risk  (12/26/2023)  Transportation Needs: No Transportation Needs (12/26/2023)  Utilities: Not At Risk (12/26/2023)  Alcohol Screen: Low Risk  (05/01/2023)  Depression (PHQ2-9): Low Risk  (09/05/2023)  Financial Resource  Strain: Low Risk  (09/03/2023)  Physical Activity: Insufficiently Active (09/03/2023)  Social Connections: Socially Isolated (12/26/2023)  Stress: No Stress Concern Present (09/03/2023)  Tobacco Use: Medium Risk (12/24/2023)  Health Literacy: Adequate Health Literacy (05/02/2023)     Readmission Risk Interventions     No data to display

## 2023-12-31 NOTE — Discharge Summary (Signed)
 Physician Discharge Summary   Patient: Alyssa Stone MRN: [id number] DOB: Jan 08, 1956  Admit date:     12/24/2023  Discharge date: 12/31/23  Discharge Physician: Verla Glaze   PCP: Gabriel John, NP   Recommendations at discharge:   Follow-up PCP 5 days Refer to podiatry as outpatient  Discharge Diagnoses: Principal Problem:   Septic shock (HCC) Active Problems:   Cellulitis of left lower extremity   Uncontrolled type 2 diabetes mellitus with hyperglycemia, with long-term current use of insulin  (HCC)   Dyslipidemia   Coronary artery disease   GERD without esophagitis   Vomiting   Hypophosphatemia   Hypokalemia   Obesity, Class II, BMI 35-39.9   Weakness   Hypomagnesemia   Cold sore    Hospital Course: 68 year old female patient with a past medical history of type 2 diabetes mellitus presenting overnight to [facility] ICU for left lower extremity cellulitis.  She initially presented with fever headache and upset stomach 0/30.  On 05/2 she underwent a colonoscopy which was unrevealing.  For the past 4 days she has been having nausea vomiting.  On exam she was found to have left lower extremity cellulitis with an open wound at the heel.   Labs were pertinent for leukocytosis with a white count of 22.5 and neutrophils of 81.  Elevated lactate at 2.1. Cr of 1.4 mg/dl from a baseline of 0.6 mg/dl.    She was also noted to have low blood pressure and was started on nor epi.  She received a total of 3 L IV fluid.  And started on Vanco and Zosyn .   CT abdomen pelvis with enlarged external iliac chain and inguinal lymph nodes with surrounding inflammatory stranding.   US  left lower ext negative for Dvt.    Procalcitonin 25.  Blood cultures pending.   On encounter this morning, patient is tachycardic active dry heaving.  5/8.  Left lower extremity still hot to touch and very red.  Continue IV antibiotics.  Advance diet to solid food.  Physical therapy evaluation  appreciated. 5/9.  Patient doing better than when she came in.  Left leg still red and erythematous.  Will change antibiotics over to Ancef since redness has not improved much with the Maxipime  and vancomycin . 5/10.  White blood cell count down to 13.5.  Erythema is starting to fade in the center but still red around the edges. 5/11.  White blood cell count down to 12.9.  Erythema continuing to fade in the center but still red around the top edge. 5/12.  Patient feeling well.  White blood cell count down to 12.3. 5/13.  White blood cell count normalized at 10.  Patient does have a fever blisters and will prescribe Valtrex x 2 doses.  Gave IV magnesium  this morning and oral potassium.   Assessment and Plan: * Septic shock (HCC) Secondary to left lower extremity cellulitis.  Present on admission.  Patient with leukocytosis, lactic acidosis, fever, tachycardia and hypotension initially requiring pressors.  White blood cell count normalized.  Erythema continues to fade.  Aggressive antibiotics changed over to Ancef on 5/9.  Continue IV Ancef with this afternoon's dose and changed to Keflex upon going home for another 7 days.  Cellulitis of left lower extremity Erythema starting to fade at that time..  Continue IV Ancef through this afternoon dose and change over to oral Keflex upon discharge for another 7 days.  Leg elevation.  Uncontrolled type 2 diabetes mellitus with hyperglycemia, with long-term current use of insulin  (  HCC) Last hemoglobin A1c 8.5.  Will increase long-acting insulin  to 17 units daily.  Patient will go back on her metformin  as outpatient.  Dyslipidemia Continue atorvastatin   Cold sore Will give 2 doses of Valtrex.  Hypomagnesemia IV magnesium  this morning  Weakness Patient improved with physical therapy will go home with home health  Obesity, Class II, BMI 35-39.9 BMI 36.19  Hypokalemia Replace orally.  Hypophosphatemia Replaced  Vomiting On presentation and  resolved.  GERD without esophagitis Patient on Protonix   Coronary artery disease Continue atorvastatin  and Plavix          Consultants: Critical care team Procedures performed: None Disposition: Home health Diet recommendation:  Cardiac and Carb modified diet DISCHARGE MEDICATION: Allergies as of 12/31/2023   No Known Allergies      Medication List     STOP taking these medications    Arexvy  120 MCG/0.5ML injection Generic drug: RSV vaccine recomb adjuvanted   clobetasol  0.05 % external solution Commonly known as: TEMOVATE    glipiZIDE  10 MG tablet Commonly known as: GLUCOTROL    metoprolol  succinate 25 MG 24 hr tablet Commonly known as: TOPROL -XL   omeprazole  20 MG capsule Commonly known as: PRILOSEC       TAKE these medications    atorvastatin  40 MG tablet Commonly known as: LIPITOR Take 1 tablet (40 mg total) by mouth every evening for cholestrol.   BD Pen Needle Nano 2nd Gen 32G X 4 MM Misc Generic drug: Insulin  Pen Needle Use once daily with insulin .   cephALEXin 500 MG capsule Commonly known as: KEFLEX Take 1 capsule (500 mg total) by mouth 4 (four) times daily for 7 days.   clopidogrel  75 MG tablet Commonly known as: PLAVIX  Take 1 tablet (75 mg total) by mouth daily.   docusate sodium  100 MG capsule Commonly known as: COLACE Take 1 capsule (100 mg total) by mouth 2 (two) times daily as needed for mild constipation.   FreeStyle Libre 3 Plus Sensor Misc Use to check blood sugar continuously. Change sensor every 15 days.   insulin  glargine 100 UNIT/ML Solostar Pen Commonly known as: LANTUS  Inject 17 Units into the skin daily. for diabetes. What changed:  how much to take when to take this   Januvia  100 MG tablet Generic drug: sitaGLIPtin  Take 1 tablet (100 mg total) by mouth daily. for diabetes.   leptospermum manuka honey Pste paste Apply 1 Application topically daily. Start taking on: Jan 01, 2024   Linzess  145 MCG Caps  capsule Generic drug: linaclotide  Take 1 capsule (145 mcg total) by mouth daily before breakfast.   Magnesium  250 MG Tabs Take 250 mg by mouth daily.   metFORMIN  500 MG 24 hr tablet Commonly known as: GLUCOPHAGE -XR Take 1 tablet (500 mg total) by mouth 2 (two) times daily for diabetes.   metoprolol  tartrate 25 MG tablet Commonly known as: LOPRESSOR  Take 1 tablet (25 mg total) by mouth 2 (two) times daily.   nitroGLYCERIN  0.4 MG SL tablet Commonly known as: NITROSTAT  Place 1 tablet (0.4 mg total) under the tongue every 5 (five) minutes as needed for chest pain.   OneTouch Delica Plus Lancet33G Misc USE TO TEST BLOOD SUGAR UP TO 4 TIMES A DAY   OneTouch Verio test strip Generic drug: glucose blood use to test blood sugar up to 4 (four) times daily.   pantoprazole  40 MG tablet Commonly known as: Protonix  Take 1 tablet (40 mg total) by mouth daily.   potassium chloride  SA 20 MEQ tablet Commonly known as: KLOR-CON   M Take 1 tablet (20 mEq total) by mouth daily for 3 days. Start taking on: Jan 01, 2024   tobramycin  0.3 % ophthalmic solution Commonly known as: TOBREX  Place 1 drop into both eyes 4 (four) times daily beginning 1 day before procedure, the day of procedure, and the day after procedure. Continue as directed.   valACYclovir 1000 MG tablet Commonly known as: VALTREX Take 1 tablet (1,000 mg total) by mouth every evening for 1 dose.   Vitamin B-12 5000 MCG Subl Place 5,000 mcg under the tongue daily.   vitamin C  1000 MG tablet Take 1,000 mg by mouth daily.   Xiidra  5 % Soln Generic drug: Lifitegrast  Place 1 drop into both eyes 2 (two) times daily.   zinc  gluconate 50 MG tablet Take 50 mg by mouth daily.               Durable Medical Equipment  (From admission, onward)           Start     Ordered   12/31/23 0947  For home use only DME 3 n 1  Once        12/31/23 0946   12/27/23 1705  For home use only DME Walker rolling  Once       Question  Answer Comment  Walker: With 5 Inch Wheels   Patient needs a walker to treat with the following condition Unsteady gait when walking      12/27/23 1704            Follow-up Information     Gabriel John, NP. Go on 01/02/2024.   Specialty: Internal Medicine Why: Go @ 9:20am. Contact information: [address] [place] [address] [phone]         Anell Baptist, DPM Follow up in 1 month(s).   Specialty: Podiatry Contact information: [address] [place] [address] [phone]         [facility] Follow up.   Specialty: Home Health Services Why: They will follow up with you for your home health needs. Contact information: [address] [place] [address] [phone]                Discharge Exam: Filed Weights   12/28/23 0447 12/30/23 0719 12/31/23 0355  Weight: 107.5 kg 107.5 kg 104.8 kg   Physical Exam HENT:     Head: Normocephalic.  Eyes:     General: Lids are normal.     Conjunctiva/sclera: Conjunctivae normal.  Cardiovascular:     Rate and Rhythm: Normal rate and regular rhythm.     Heart sounds: Normal heart sounds, S1 normal and S2 normal.  Pulmonary:     Breath sounds: No decreased breath sounds, wheezing, rhonchi or rales.  Abdominal:     Palpations: Abdomen is soft.     Tenderness: There is no abdominal tenderness.  Musculoskeletal:     Right lower leg: No swelling.     Left lower leg: Swelling present.  Skin:    General: Skin is warm.     Comments: Erythema continues to fade top of cellulitis.  Much improved in the center of her leg with erythema.  Some bruising on the medial side.  Neurological:     Mental Status: She is alert and oriented to person, place, and time.      Condition at discharge: stable  The results of significant diagnostics from this hospitalization (including imaging, microbiology, ancillary and laboratory) are listed  below for reference.   Imaging  Studies: DG Foot 2 Views Left Result Date: 12/26/2023 CLINICAL DATA:  Cellulitis of the left foot. EXAM: LEFT FOOT - 2 VIEW COMPARISON:  None Available. FINDINGS: There is no acute fracture or dislocation. The bones are osteopenic. There is subcutaneous edema of the visualized distal leg. IMPRESSION: 1. No acute fracture or dislocation. 2. Osteopenia. Electronically Signed   By: Angus Bark M.D.   On: 12/26/2023 14:15   US  Venous Img Lower Unilateral Left (DVT) Result Date: 12/25/2023 CLINICAL DATA:  Redness and swelling in the left lower extremity EXAM: Left LOWER EXTREMITY VENOUS DOPPLER ULTRASOUND TECHNIQUE: Gray-scale sonography with compression, as well as color and duplex ultrasound, were performed to evaluate the deep venous system(s) from the level of the common femoral vein through the popliteal and proximal calf veins. COMPARISON:  None Available. FINDINGS: VENOUS Normal compressibility of the common femoral, superficial femoral, and popliteal veins, as well as the visualized calf veins. Visualized portions of profunda femoral vein and great saphenous vein unremarkable. No filling defects to suggest DVT on grayscale or color Doppler imaging. Doppler waveforms show normal direction of venous flow, normal respiratory plasticity and response to augmentation. Limited views of the contralateral common femoral vein are unremarkable. OTHER None. Limitations: none IMPRESSION: Negative. Electronically Signed   By: Rozell Cornet M.D.   On: 12/25/2023 02:28   CT ABDOMEN PELVIS W CONTRAST Result Date: 12/24/2023 CLINICAL DATA:  Sepsis with nausea and vomiting. EXAM: CT ABDOMEN AND PELVIS WITH CONTRAST TECHNIQUE: Multidetector CT imaging of the abdomen and pelvis was performed using the standard protocol following bolus administration of intravenous contrast. RADIATION DOSE REDUCTION: This exam was performed according to the departmental dose-optimization program which includes automated exposure control,  adjustment of the mA and/or kV according to patient size and/or use of iterative reconstruction technique. CONTRAST:  OMNIPAQUE  IOHEXOL  300 MG/ML  SOLN COMPARISON:  None Available. FINDINGS: Lower chest: There is atelectasis in the lung bases. Hepatobiliary: No focal liver abnormality is seen. No gallstones, gallbladder wall thickening, or biliary dilatation. Pancreas: Unremarkable. No pancreatic ductal dilatation or surrounding inflammatory changes. Spleen: Normal in size without focal abnormality. Adrenals/Urinary Tract: There is an indeterminate rounded left adrenal lesion measuring 11 mm. Right adrenal gland is within normal limits. The kidneys and bladder are within normal limits. Stomach/Bowel: Small hiatal hernia is present. There is wall thickening of the distal esophagus. Stomach is within normal limits. Appendix appears normal. No evidence of bowel wall thickening, distention, or inflammatory changes. There are scattered sigmoid colon diverticula. Vascular/Lymphatic: Aorta and IVC are normal in size. There are enlarged left external iliac chain lymph nodes measuring up to 16 mm short axis. There is surrounding inflammatory stranding. There also enlarged left inguinal lymph nodes measuring up to 13 mm short axis with surrounding inflammatory stranding. Reproductive: Uterus is slightly lobulated likely related to fibroid change. Adnexa are within normal limits. Other: No abdominal wall hernia or abnormality. No abdominopelvic ascites. Musculoskeletal: No acute or significant osseous findings. IMPRESSION: 1. Enlarged left external iliac chain and inguinal lymph nodes with surrounding inflammatory stranding. Findings are nonspecific and may be reactive. Follow-up recommended to ensure resolution. 2. Small hiatal hernia with wall thickening of the distal esophagus worrisome for esophagitis. 3. Indeterminate left adrenal lesion measuring 11 mm. Recommend further evaluation with adrenal protocol CT or MRI.  4. Fibroid uterus. 5. Sigmoid colon diverticulosis. Electronically Signed   By: Tyron Gallon M.D.   On: 12/24/2023 19:27   DG Tibia/Fibula  Left Result Date: 12/24/2023 CLINICAL DATA:  Skin/soft tissue infection, eval for evidence of underlying osteomyelitis. Redness in the left calf EXAM: LEFT TIBIA AND FIBULA - 2 VIEW COMPARISON:  None Available. FINDINGS: There is no evidence of fracture or other focal bone lesions. Soft tissues are unremarkable. No bone destruction. No radiopaque foreign bodies. IMPRESSION: No acute bony abnormality. Electronically Signed   By: Janeece Mechanic M.D.   On: 12/24/2023 18:33   DG Chest Port 1 View Result Date: 12/24/2023 CLINICAL DATA:  Sepsis EXAM: PORTABLE CHEST 1 VIEW COMPARISON:  CT 11/27/2020 FINDINGS: The heart size and mediastinal contours are within normal limits. Both lungs are clear. The visualized skeletal structures are unremarkable. IMPRESSION: No active disease. Electronically Signed   By: Janeece Mechanic M.D.   On: 12/24/2023 18:24    Microbiology: Results for orders placed or performed during the hospital encounter of 12/24/23  Culture, blood (Routine x 2)     Status: None   Collection Time: 12/24/23  6:05 PM   Specimen: BLOOD  Result Value Ref Range Status   Specimen Description BLOOD LEFT ANTECUBITAL  Final   Special Requests   Final    BOTTLES DRAWN AEROBIC AND ANAEROBIC Blood Culture results may not be optimal due to an inadequate volume of blood received in culture bottles   Culture   Final    NO GROWTH 5 DAYS Performed at [facility], [address] [place], [place] [address]    Report Status 12/29/2023 FINAL  Final  Resp panel by RT-PCR (RSV, Flu A&B, Covid) Anterior Nasal Swab     Status: None   Collection Time: 12/24/23  6:30 PM   Specimen: Anterior Nasal Swab  Result Value Ref Range Status   SARS Coronavirus 2 by RT PCR NEGATIVE NEGATIVE Final    Comment: (NOTE) SARS-CoV-2 target nucleic acids are NOT DETECTED.  The  SARS-CoV-2 RNA is generally detectable in upper respiratory specimens during the acute phase of infection. The lowest concentration of SARS-CoV-2 viral copies this assay can detect is 138 copies/mL. A negative result does not preclude SARS-Cov-2 infection and should not be used as the sole basis for treatment or other patient management decisions. A negative result may occur with  improper specimen collection/handling, submission of specimen other than nasopharyngeal swab, presence of viral mutation(s) within the areas targeted by this assay, and inadequate number of viral copies(<138 copies/mL). A negative result must be combined with clinical observations, patient history, and epidemiological information. The expected result is Negative.  Fact Sheet for Patients:  BloggerCourse.com  Fact Sheet for Healthcare Providers:  SeriousBroker.it  This test is no t yet approved or cleared by the [place]  FDA and  has been authorized for detection and/or diagnosis of SARS-CoV-2 by FDA under an Emergency Use Authorization (EUA). This EUA will remain  in effect (meaning this test can be used) for the duration of the COVID-19 declaration under Section 564(b)(1) of the Act, 21 U.S.C.section 360bbb-3(b)(1), unless the authorization is terminated  or revoked sooner.       Influenza A by PCR NEGATIVE NEGATIVE Final   Influenza B by PCR NEGATIVE NEGATIVE Final    Comment: (NOTE) The Xpert Xpress SARS-CoV-2/FLU/RSV plus assay is intended as an aid in the diagnosis of influenza from Nasopharyngeal swab specimens and should not be used as a sole basis for treatment. Nasal washings and aspirates are unacceptable for Xpert Xpress SARS-CoV-2/FLU/RSV testing.  Fact Sheet for Patients: BloggerCourse.com  Fact Sheet for Healthcare Providers: SeriousBroker.it  This test is not yet approved or  cleared by the [place]  FDA and has been authorized for detection and/or diagnosis of SARS-CoV-2 by FDA under an Emergency Use Authorization (EUA). This EUA will remain in effect (meaning this test can be used) for the duration of the COVID-19 declaration under Section 564(b)(1) of the Act, 21 U.S.C. section 360bbb-3(b)(1), unless the authorization is terminated or revoked.     Resp Syncytial Virus by PCR NEGATIVE NEGATIVE Final    Comment: (NOTE) Fact Sheet for Patients: BloggerCourse.com  Fact Sheet for Healthcare Providers: SeriousBroker.it  This test is not yet approved or cleared by the [place]  FDA and has been authorized for detection and/or diagnosis of SARS-CoV-2 by FDA under an Emergency Use Authorization (EUA). This EUA will remain in effect (meaning this test can be used) for the duration of the COVID-19 declaration under Section 564(b)(1) of the Act, 21 U.S.C. section 360bbb-3(b)(1), unless the authorization is terminated or revoked.  Performed at [facility], [address] [place], [place] [address]   Urine Culture     Status: None   Collection Time: 12/24/23  9:58 PM   Specimen: Urine, Random  Result Value Ref Range Status   Specimen Description   Final    URINE, RANDOM Performed at [facility], [address], [place], [place] [address]    Special Requests   Final    NONE Reflexed from [id number] Performed at [facility], [address] [place], [place] [address]    Culture   Final    NO GROWTH Performed at [facility], [address], [place], [place] [address]    Report Status 12/26/2023 FINAL  Final  MRSA Next Gen by PCR, Nasal     Status: None   Collection Time: 12/25/23 12:27 AM   Specimen: Nasal Mucosa; Nasal Swab  Result Value Ref Range Status   MRSA by PCR Next Gen NOT DETECTED NOT DETECTED Final    Comment: (NOTE) The GeneXpert MRSA Assay (FDA  approved for NASAL specimens only), is one component of a comprehensive MRSA colonization surveillance program. It is not intended to diagnose MRSA infection nor to guide or monitor treatment for MRSA infections. Test performance is not FDA approved in patients less than 79 years old. Performed at [facility], [address] [place], [place] [address]   Culture, blood (Routine x 2)     Status: None   Collection Time: 12/25/23  1:45 AM   Specimen: BLOOD RIGHT ARM  Result Value Ref Range Status   Specimen Description BLOOD RIGHT ARM  Final   Special Requests   Final    BOTTLES DRAWN AEROBIC AND ANAEROBIC Blood Culture adequate volume   Culture   Final    NO GROWTH 5 DAYS Performed at [facility], [address] [place], [place] [address]    Report Status 12/30/2023 FINAL  Final    Labs: CBC: Recent Labs  Lab 12/24/23 1805 12/25/23 0439 12/27/23 0711 12/28/23 0655 12/29/23 0631 12/30/23 0412 12/31/23 0736  WBC 22.5*   < > 16.4* 13.5* 12.9* 12.3* 10.1  NEUTROABS 19.8*  --   --   --   --   --   --   HGB 12.7   < > 10.3* 10.8* 11.5* 10.1* 11.2*  HCT 37.7   < > 30.7* 32.7* 34.2* 30.6* 34.1*  MCV 87.7   < > 87.7 87.2 88.6 87.7 87.4  PLT 267   < > 227  280 296 308 377   < > = values in this interval not displayed.   Basic Metabolic Panel: Recent Labs  Lab 12/25/23 0439 12/26/23 0332 12/27/23 0711 12/28/23 0655 12/29/23 0631 12/30/23 0412 12/31/23 0736  NA 134* 136  --  136 137 137  --   K 3.4* 3.7 3.5 3.2* 3.6 3.0* 3.3*  CL 105 107  --  104 103 105  --   CO2 20* 20*  --  23 26 26   --   GLUCOSE 248* 176*  --  179* 189* 169*  --   BUN 30* 31*  --  14 9 9   --   CREATININE 1.12* 0.80 0.64 0.57 0.57 0.37*  --   CALCIUM  7.9* 7.9*  --  8.2* 8.4* 8.1*  --   MG 1.9 2.0  --  1.9 1.9  --  1.8  PHOS 2.4* 2.2* 2.1* 2.6 2.6  --   --    Liver Function Tests: Recent Labs  Lab 12/24/23 1805 12/25/23 0439 12/28/23 0655  AST 108* 83*  --   ALT 36 35  --    ALKPHOS 57 66  --   BILITOT 1.2 0.8  --   PROT 6.8 6.3*  --   ALBUMIN  3.0* 2.8* 2.3*   CBG: Recent Labs  Lab 12/30/23 1145 12/30/23 1703 12/30/23 2117 12/31/23 0822 12/31/23 1139  GLUCAP 224* 200* 262* 188* 237*    Discharge time spent: greater than 30 minutes.  Signed: Verla Glaze, MD [facility] 12/31/2023

## 2023-12-31 NOTE — Discharge Instructions (Addendum)
 Apply medihoney to heel and cover  Do you feel isolated?  [facility] offers a [employer] that anyone can call toll free at [phone]. The friendship line is available 24 hours a day  [employer] is a Program of All-inclusive Care for the Elderly (PACE). Their mission is to promote and sustain the independence of seniors wishing to remain in the community. They provide seniors with comprehensive long-term health, social, medical and dietary care. Their program is a safe alternative to nursing home care. [phone]  [facility] Physical Address [facility] [address] [place], [place] [address] Phone: [phone]. . Online zoom yoga class, connect with others without leaving your home [facility] offers [place] dance cardio sessions for individuals via Zoom. This program provides: - Dance fitness activities Please contact program for more information. Servinganyone in need adults 18+ hiv/aids individuals families Call [phone]  Email [email or web address] to get more info  [employer] offers an online [employer] to individuals where they can receive help to focus on their best health. Whether you're a [employer] member or not, the neighborhood center offers a... Main Serviceshealth education  exercise & fitness  community support services  recreation  virtual support Other Servicessupport groups Servinganyone in need adults young adults teens seniors individuals families [email or web address] to get more info  Schedule on their website  [facility] offers an array of activities for adults age 65 and over. This program provides:- Fitness and health programs- Tech classes- Activity books Main Serviceshealth education  community support services  exercise & fitness  recreation  more education Servingseniors  Call [phone]    For more resources go online to  [email or web address] and type in you zipcode

## 2023-12-31 NOTE — Assessment & Plan Note (Signed)
 IV magnesium  this morning

## 2023-12-31 NOTE — TOC CM/SW Note (Signed)
 Patient is not able to walk the distance required to go the bathroom, or he/she is unable to safely negotiate stairs required to access the bathroom.  A 3in1 BSC will alleviate this problem

## 2023-12-31 NOTE — Assessment & Plan Note (Signed)
 Will give 2 doses of Valtrex.

## 2023-12-31 NOTE — Plan of Care (Signed)
  Problem: Clinical Measurements: Goal: Signs and symptoms of infection will decrease 12/31/2023 0202 by Jake Mayers, RN Outcome: Progressing 12/30/2023 2012 by Jake Mayers, RN Outcome: Progressing   Problem: Coping: Goal: Ability to adjust to condition or change in health will improve 12/31/2023 0202 by Jake Mayers, RN Outcome: Progressing 12/30/2023 2012 by Jake Mayers, RN Outcome: Progressing   Problem: Skin Integrity: Goal: Risk for impaired skin integrity will decrease 12/31/2023 0202 by Jake Mayers, RN Outcome: Progressing 12/30/2023 2012 by Jake Mayers, RN Outcome: Progressing   Problem: Education: Goal: Knowledge of General Education information will improve Description: Including pain rating scale, medication(s)/side effects and non-pharmacologic comfort measures 12/31/2023 0202 by Jake Mayers, RN Outcome: Progressing 12/30/2023 2012 by Jake Mayers, RN Outcome: Progressing   Problem: Activity: Goal: Risk for activity intolerance will decrease 12/31/2023 0202 by Jake Mayers, RN Outcome: Progressing 12/30/2023 2012 by Jake Mayers, RN Outcome: Progressing   Problem: Coping: Goal: Level of anxiety will decrease 12/31/2023 0202 by Jake Mayers, RN Outcome: Progressing 12/30/2023 2012 by Jake Mayers, RN Outcome: Progressing   Problem: Safety: Goal: Ability to remain free from injury will improve 12/31/2023 0202 by Jake Mayers, RN Outcome: Progressing 12/30/2023 2012 by Jake Mayers, RN Outcome: Progressing

## 2024-01-02 ENCOUNTER — Ambulatory Visit: Payer: Self-pay | Admitting: Primary Care

## 2024-01-02 ENCOUNTER — Encounter: Payer: Self-pay | Admitting: Primary Care

## 2024-01-02 ENCOUNTER — Ambulatory Visit (INDEPENDENT_AMBULATORY_CARE_PROVIDER_SITE_OTHER): Admitting: Primary Care

## 2024-01-02 ENCOUNTER — Other Ambulatory Visit: Payer: Self-pay

## 2024-01-02 VITALS — BP 136/74 | HR 100 | Temp 97.5°F | Ht 67.0 in | Wt 232.0 lb

## 2024-01-02 DIAGNOSIS — E1140 Type 2 diabetes mellitus with diabetic neuropathy, unspecified: Secondary | ICD-10-CM

## 2024-01-02 DIAGNOSIS — E279 Disorder of adrenal gland, unspecified: Secondary | ICD-10-CM | POA: Diagnosis not present

## 2024-01-02 DIAGNOSIS — L03116 Cellulitis of left lower limb: Secondary | ICD-10-CM | POA: Diagnosis not present

## 2024-01-02 DIAGNOSIS — Z794 Long term (current) use of insulin: Secondary | ICD-10-CM

## 2024-01-02 DIAGNOSIS — E113513 Type 2 diabetes mellitus with proliferative diabetic retinopathy with macular edema, bilateral: Secondary | ICD-10-CM

## 2024-01-02 LAB — BASIC METABOLIC PANEL WITH GFR
BUN: 5 mg/dL — ABNORMAL LOW (ref 6–23)
CO2: 31 meq/L (ref 19–32)
Calcium: 8.7 mg/dL (ref 8.4–10.5)
Chloride: 97 meq/L (ref 96–112)
Creatinine, Ser: 0.47 mg/dL (ref 0.40–1.20)
GFR: 97.21 mL/min (ref >60.00)
Glucose, Bld: 310 mg/dL — ABNORMAL HIGH (ref 70–99)
Potassium: 4 meq/L (ref 3.5–5.1)
Sodium: 134 meq/L — ABNORMAL LOW (ref 135–145)

## 2024-01-02 MED ORDER — INSULIN GLARGINE 100 UNIT/ML SOLOSTAR PEN: 20.0000 [IU] | PEN_INJECTOR | Freq: Every day | SUBCUTANEOUS | Status: DC

## 2024-01-02 NOTE — Assessment & Plan Note (Signed)
 Improving.  Recent hospital stay.  Hospital labs, notes, imaging reviewed.  Continue cephalexin antibiotics until complete. Repeat BMP pending today. Referral placed to podiatry for further management.

## 2024-01-02 NOTE — Patient Instructions (Addendum)
 Stop by the lab prior to leaving today. I will notify you of your results once received.   You will either be contacted via phone regarding your referral to podiatry and the nutritionist, or you may receive a letter on your MyChart portal from our referral team with instructions for scheduling an appointment. Please let us  know if you have not been contacted by anyone within two weeks.  You will receive a phone call regarding the MRI.  Resume Januvia  100 mg daily for diabetes.  Continue Lantus  20 units daily and metformin  500 mg twice daily for diabetes.  Hold the glipizide  for now.  Start checking her blood sugars and notify me if your blood sugars continuously run at or above 150.  Message your GI doctor regarding the H. pylori treatment.  Schedule a diabetes follow-up for late July.  It was a pleasure to see you today!

## 2024-01-02 NOTE — Assessment & Plan Note (Signed)
 Incidental finding on CT abdomen/pelvis. Discussed with patient today.  MRI adrenal protocol ordered and pending.

## 2024-01-02 NOTE — Assessment & Plan Note (Addendum)
 Uncontrolled with A1c of 8.5 in April 25.  Resume Januvia  100 mg daily. Continue Lantus  20 units daily, metformin  ER 500 mg/day. Hold glipizide  10 mg for now.  Diabetes nutritionist referral placed.  She will start checking glucose levels with her CGM.  If glucose levels remain consistently above 150 then we will resume glipizide .  Follow-up in late July

## 2024-01-02 NOTE — Progress Notes (Signed)
 Subjective:    Patient ID: Alyssa Stone, female    DOB: 29-Apr-1956, 68 y.o.   MRN: [id number]  HPI  Alyssa Stone is a very pleasant 68 y.o. female with a history of type 2 diabetes, hyperlipidemia, CAD, septic shock, weakness, hypomagnesemia who presents today for hospital follow-up.  She presented to [facility] ED on 12/24/2023 via EMS with a 1 day history of nausea and vomiting, lethargy upon arrival.  She had a colonoscopy 4 days prior.  Code sepsis was activated and she was treated with IV fluids and IV antibiotics.  She underwent CT abdomen/pelvis which showed enlarged left external iliac chain and inguinal lymph nodes with surrounding inflammatory stranding, small hiatal hernia and distal esophageal thickening, indeterminate left adrenal lesion, uterine fibroid, sigmoid diverticulosis without diverticulitis.  Labs revealed leukocytosis, hyperglycemia.  Respiratory panel and chest x-ray negative.  She was found to have cracked callus to her left heel, erythema to left lower extremity between distal knee and proximal ankle.  She was hypotensive so she was admitted to the ICU and initiated on Levophed .  She was admitted for left lower extremity cellulitis.  During her hospital stay she continued treatment with IV antibiotics and fluids.  She participated in occupational and physical therapy.  Lower extremity erythema gradually faded and leukocytosis improved.  She developed a cold sore and was treated with Valtrex course.  She was discharged home on 12/31/2023 with recommendation for PCP follow-up and referral to podiatry. She was prescribed Keflex 500 mg QID x 7 days,   Since her hospital stay she's feeling better. The erythema and swelling have improved. She's keeping pressure off of her heel. She was diagnosed with H pylori infection but has yet to be treated. She's been prescribed pantoprazole  but has yet to start taking. She is needing a referral to podiatry and nutritionist referral. She  questions when to resume Januvia . She has been injecting 20 units of Lantus  daily and has been taking metformin  500 mg BID. She has not taken Glipizide  or Januvia .    Review of Systems  Constitutional:  Positive for fatigue. Negative for fever.  Respiratory:  Negative for shortness of breath.   Cardiovascular:  Positive for leg swelling. Negative for chest pain.  Skin:  Positive for color change and wound.  Neurological:  Negative for headaches.         Past Medical History:  Diagnosis Date   Allergy    Anemia    CAD (coronary artery disease)    s/p DES to o-pLAD in 09/2020 // dLAD 75 (small, med Rx)   Cataract    Chicken pox    Depression    Diabetes mellitus    Fainting episodes    Gastroesophageal reflux disease    Hyperlipidemia    controlled om meds    Sinus tachycardia     Social History   Socioeconomic History   Marital status: Divorced    Spouse name: Not on file   Number of children: 3   Years of education: Not on file   Highest education level: 12th grade  Occupational History   Not on file  Tobacco Use   Smoking status: Former    Current packs/day: 0.00    Types: Cigarettes    Start date: 02/17/1973    Quit date: 02/18/1975    Years since quitting: 48.9   Smokeless tobacco: Never  Vaping Use   Vaping status: Never Used  Substance and Sexual Activity   Alcohol use: Not Currently  Drug use: Never   Sexual activity: Not Currently    Birth control/protection: None  Other Topics Concern   Not on file  Social History Narrative   Not on file   Social Drivers of Health   Financial Resource Strain: Low Risk  (09/03/2023)   Overall Financial Resource Strain (CARDIA)    Difficulty of Paying Living Expenses: Not hard at all  Food Insecurity: No Food Insecurity (12/26/2023)   Hunger Vital Sign    Worried About Running Out of Food in the Last Year: Never true    Ran Out of Food in the Last Year: Never true  Transportation Needs: No Transportation Needs  (12/26/2023)   PRAPARE - Administrator, Civil Service (Medical): No    Lack of Transportation (Non-Medical): No  Physical Activity: Insufficiently Active (09/03/2023)   Exercise Vital Sign    Days of Exercise per Week: 2 days    Minutes of Exercise per Session: 20 min  Stress: No Stress Concern Present (09/03/2023)   [employer] of Occupational Health - Occupational Stress Questionnaire    Feeling of Stress : Not at all  Social Connections: Socially Isolated (12/26/2023)   Social Connection and Isolation Panel [NHANES]    Frequency of Communication with Friends and Family: Once a week    Frequency of Social Gatherings with Friends and Family: Once a week    Attends Religious Services: Never    Database administrator or Organizations: No    Attends Banker Meetings: Never    Marital Status: Divorced  Catering manager Violence: Not At Risk (12/26/2023)   Humiliation, Afraid, Rape, and Kick questionnaire    Fear of Current or Ex-Partner: No    Emotionally Abused: No    Physically Abused: No    Sexually Abused: No    Past Surgical History:  Procedure Laterality Date   ANAL FISSURE REPAIR     CATARACT EXTRACTION Bilateral 08/20/2022   CESAREAN SECTION     CORONARY IMAGING/OCT N/A 10/14/2020   Procedure: INTRAVASCULAR IMAGING/OCT;  Surgeon: Lucendia Rusk, MD;  Location: [facility] INVASIVE CV LAB;  Service: Cardiovascular;  Laterality: N/A;   CORONARY PRESSURE/FFR STUDY N/A 10/14/2020   Procedure: INTRAVASCULAR PRESSURE WIRE/FFR STUDY;  Surgeon: Lucendia Rusk, MD;  Location: [facility] INVASIVE CV LAB;  Service: Cardiovascular;  Laterality: N/A;   CORONARY STENT INTERVENTION N/A 10/14/2020   Procedure: CORONARY STENT INTERVENTION;  Surgeon: Lucendia Rusk, MD;  Location: [facility] INVASIVE CV LAB;  Service: Cardiovascular;  Laterality: N/A;   EYE SURGERY     LEFT HEART CATH AND CORONARY ANGIOGRAPHY N/A 10/14/2020   Procedure: LEFT HEART CATH AND CORONARY  ANGIOGRAPHY;  Surgeon: Lucendia Rusk, MD;  Location: [facility] INVASIVE CV LAB;  Service: Cardiovascular;  Laterality: N/A;   TONSILLECTOMY Bilateral    TUBAL LIGATION      Family History  Problem Relation Age of Onset   Cancer Mother    Diabetes Mother    Miscarriages / [place] Mother    Hypertension Father    Heart attack Father    Cancer Sister    Diabetes Sister    Diabetes Brother    Heart attack Brother    Kidney disease Brother    Hyperlipidemia Brother    Colon cancer Neg Hx    Colon polyps Neg Hx    Esophageal cancer Neg Hx    Rectal cancer Neg Hx    Stomach cancer Neg Hx    Inflammatory bowel disease Neg Hx  Liver disease Neg Hx    Pancreatic cancer Neg Hx     No Known Allergies  Current Outpatient Medications on File Prior to Visit  Medication Sig Dispense Refill   Ascorbic Acid  (VITAMIN C ) 1000 MG tablet Take 1,000 mg by mouth daily.     atorvastatin  (LIPITOR) 40 MG tablet Take 1 tablet (40 mg total) by mouth every evening for cholestrol. 90 tablet 2   cephALEXin (KEFLEX) 500 MG capsule Take 1 capsule (500 mg total) by mouth 4 (four) times daily for 7 days. 28 capsule 0   clopidogrel  (PLAVIX ) 75 MG tablet Take 1 tablet (75 mg total) by mouth daily. 90 tablet 3   Continuous Glucose Sensor (FREESTYLE LIBRE 3 PLUS SENSOR) MISC Use to check blood sugar continuously. Change sensor every 15 days. 6 each 1   Cyanocobalamin  (VITAMIN B-12) 5000 MCG SUBL Place 5,000 mcg under the tongue daily.     docusate sodium  (COLACE) 100 MG capsule Take 1 capsule (100 mg total) by mouth 2 (two) times daily as needed for mild constipation. 60 capsule 0   glucose blood (ONETOUCH VERIO) test strip use to test blood sugar up to 4 (four) times daily. 300 strip 3   Insulin  Pen Needle (TECHLITE PLUS PEN NEEDLES) 32G X 4 MM MISC Use once daily with insulin . 100 each 3   Lancets (ONETOUCH DELICA PLUS LANCET33G) MISC USE TO TEST BLOOD SUGAR UP TO 4 TIMES A DAY 300 each 5   leptospermum  manuka honey (MEDIHONEY) PSTE paste Apply 1 Application topically daily.     Lifitegrast  (XIIDRA ) 5 % SOLN Place 1 drop into both eyes 2 (two) times daily. 180 each 4   linaclotide  (LINZESS ) 145 MCG CAPS capsule Take 1 capsule (145 mcg total) by mouth daily before breakfast. 30 capsule 6   Magnesium  250 MG TABS Take 250 mg by mouth daily.     metFORMIN  (GLUCOPHAGE -XR) 500 MG 24 hr tablet Take 1 tablet (500 mg total) by mouth 2 (two) times daily for diabetes. 180 tablet 1   metoprolol  tartrate (LOPRESSOR ) 25 MG tablet Take 1 tablet (25 mg total) by mouth 2 (two) times daily. 60 tablet 0   nitroGLYCERIN  (NITROSTAT ) 0.4 MG SL tablet Place 1 tablet (0.4 mg total) under the tongue every 5 (five) minutes as needed for chest pain. 25 tablet 3   potassium chloride  SA (KLOR-CON  M) 20 MEQ tablet Take 1 tablet (20 mEq total) by mouth daily for 3 days. 3 tablet 0   tobramycin  (TOBREX ) 0.3 % ophthalmic solution Place 1 drop into both eyes 4 (four) times daily beginning 1 day before procedure, the day of procedure, and the day after procedure. Continue as directed. 5 mL 5   zinc  gluconate 50 MG tablet Take 50 mg by mouth daily.     pantoprazole  (PROTONIX ) 40 MG tablet Take 1 tablet (40 mg total) by mouth daily. (Patient not taking: Reported on 01/02/2024) 30 tablet 0   sitaGLIPtin  (JANUVIA ) 100 MG tablet Take 1 tablet (100 mg total) by mouth daily. for diabetes. (Patient not taking: Reported on 01/02/2024) 90 tablet 1   No current facility-administered medications on file prior to visit.    BP 136/74   Pulse 100   Temp (!) 97.5 F (36.4 C) (Temporal)   Ht 5\' 7"  (1.702 m)   Wt 232 lb (105.2 kg)   SpO2 96%   BMI 36.34 kg/m  Objective:   Physical Exam Cardiovascular:     Rate and Rhythm: Normal rate and  regular rhythm.  Pulmonary:     Effort: Pulmonary effort is normal.     Breath sounds: Normal breath sounds.  Musculoskeletal:     Cervical back: Neck supple.  Skin:    General: Skin is warm and dry.      Findings: Erythema present.     Comments: Medium red colored erythema to left lower extremity from distal knee to ankle. Mild to moderate swelling.   Neurological:     Mental Status: She is alert and oriented to person, place, and time.  Psychiatric:        Mood and Affect: Mood normal.           Assessment & Plan:  Cellulitis of left lower extremity Assessment & Plan: Improving.  Recent hospital stay.  Hospital labs, notes, imaging reviewed.  Continue cephalexin antibiotics until complete. Repeat BMP pending today. Referral placed to podiatry for further management.  Orders: -     Ambulatory referral to Podiatry -     Basic metabolic panel with GFR  Type 2 diabetes mellitus with both eyes affected by proliferative retinopathy and macular edema, with long-term current use of insulin  [facility]) Assessment & Plan: Uncontrolled with A1c of 8.5 in April 25.  Resume Januvia  100 mg daily. Continue Lantus  20 units daily, metformin  ER 500 mg/day. Hold glipizide  10 mg for now.  Diabetes nutritionist referral placed.  She will start checking glucose levels with her CGM.  If glucose levels remain consistently above 150 then we will resume glipizide .  Follow-up in late July  Orders: -     Referral to Nutrition and Diabetes Services  Type 2 diabetes mellitus with diabetic neuropathy, without long-term current use of insulin  (HCC) Assessment & Plan: Uncontrolled with A1c of 8.5 in April 25.  Resume Januvia  100 mg daily. Continue Lantus  20 units daily, metformin  ER 500 mg/day. Hold glipizide  10 mg for now.  Diabetes nutritionist referral placed.  She will start checking glucose levels with her CGM.  If glucose levels remain consistently above 150 then we will resume glipizide .  Follow-up in late July  Orders: -     Insulin  Glargine; Inject 20 Units into the skin daily. for diabetes.  Lesion of adrenal gland [facility]) Assessment & Plan: Incidental finding on CT  abdomen/pelvis. Discussed with patient today.  MRI adrenal protocol ordered and pending.  Orders: -     MR ABDOMEN W WO CONTRAST; Future        Moustapha Tooker K Rashaun Wichert, NP

## 2024-01-17 ENCOUNTER — Ambulatory Visit
Admission: RE | Admit: 2024-01-17 | Discharge: 2024-01-17 | Disposition: A | Discharge status: Home / Self Care | Source: Ambulatory Visit | Attending: Primary Care | Admitting: Primary Care

## 2024-01-17 DIAGNOSIS — Z1231 Encounter for screening mammogram for malignant neoplasm of breast: Secondary | ICD-10-CM

## 2024-01-21 ENCOUNTER — Encounter: Payer: Self-pay | Admitting: *Deleted

## 2024-01-22 ENCOUNTER — Ambulatory Visit: Payer: Self-pay | Admitting: Primary Care

## 2024-01-22 LAB — HM DIABETES EYE EXAM

## 2024-01-24 ENCOUNTER — Other Ambulatory Visit: Payer: Self-pay

## 2024-01-28 MED FILL — "Insulin Pen Needle 32 G X 4 MM (1/6"" or 5/32"")": 100 days supply | Qty: 100 | Fill #2 | Status: AC

## 2024-01-28 MED FILL — Clopidogrel Bisulfate Tab 75 MG (Base Equiv): ORAL | 90 days supply | Qty: 90 | Fill #1 | Status: AC

## 2024-01-29 ENCOUNTER — Other Ambulatory Visit: Payer: Self-pay

## 2024-01-30 ENCOUNTER — Ambulatory Visit: Payer: Self-pay | Admitting: Podiatry

## 2024-01-30 DIAGNOSIS — R600 Localized edema: Secondary | ICD-10-CM | POA: Diagnosis not present

## 2024-01-30 DIAGNOSIS — I878 Other specified disorders of veins: Secondary | ICD-10-CM | POA: Diagnosis not present

## 2024-01-30 DIAGNOSIS — I872 Venous insufficiency (chronic) (peripheral): Secondary | ICD-10-CM | POA: Diagnosis not present

## 2024-01-30 NOTE — Progress Notes (Signed)
 Subjective:  Patient ID: Alyssa Stone, female    DOB: [birth year],  MRN: [id number]  Chief Complaint  Patient presents with   Foot Pain    68 y.o. female presents with the above complaint.  Patient presents with follow-up of left leg cellulitis with history of venous stasis edema.  She states she was hospitalized and received antibiotics she is here for follow-up.  She states that she is doing a lot better the swelling is calm down the redness is gone away denies any other acute complaints no pain.   Review of Systems: Negative except as noted in the HPI. Denies N/V/F/Ch.  Past Medical History:  Diagnosis Date   Allergy    Anemia    CAD (coronary artery disease)    s/p DES to o-pLAD in 09/2020 // dLAD 75 (small, med Rx)   Cataract    Chicken pox    Depression    Diabetes mellitus    Fainting episodes    Gastroesophageal reflux disease    Hyperlipidemia    controlled om meds    Sinus tachycardia     Current Outpatient Medications:    amoxicillin  (AMOXIL ) 500 MG capsule, Take 2 capsules (1,000 mg total) by mouth 2 (two) times daily., Disp: 56 capsule, Rfl: 0   Ascorbic Acid  (VITAMIN C ) 1000 MG tablet, Take 1,000 mg by mouth daily., Disp: , Rfl:    atorvastatin  (LIPITOR) 40 MG tablet, Take 1 tablet (40 mg total) by mouth every evening for cholestrol., Disp: 90 tablet, Rfl: 2   bismuth  subsalicylate (PEPTO BISMOL) 262 MG chewable tablet, Chew 2 tablets (524 mg total) by mouth 4 (four) times daily for 14 days., Disp: 120 tablet, Rfl: 0   clopidogrel  (PLAVIX ) 75 MG tablet, Take 1 tablet (75 mg total) by mouth daily., Disp: 90 tablet, Rfl: 3   Continuous Glucose Sensor (FREESTYLE LIBRE 3 PLUS SENSOR) MISC, Use to check blood sugar continuously. Change sensor every 15 days., Disp: 6 each, Rfl: 1   Cyanocobalamin  (VITAMIN B-12) 5000 MCG SUBL, Place 5,000 mcg under the tongue daily., Disp: , Rfl:    docusate sodium  (COLACE) 100 MG capsule, Take 1 capsule (100 mg total) by mouth 2  (two) times daily as needed for mild constipation., Disp: 60 capsule, Rfl: 0   glucose blood (ONETOUCH VERIO) test strip, use to test blood sugar up to 4 (four) times daily., Disp: 300 strip, Rfl: 3   insulin  glargine (LANTUS ) 100 UNIT/ML Solostar Pen, Inject 20 Units into the skin daily. for diabetes., Disp: , Rfl:    Insulin  Pen Needle (TECHLITE PLUS PEN NEEDLES) 32G X 4 MM MISC, Use once daily with insulin ., Disp: 100 each, Rfl: 3   Lancets (ONETOUCH DELICA PLUS LANCET33G) MISC, USE TO TEST BLOOD SUGAR UP TO 4 TIMES A DAY, Disp: 300 each, Rfl: 5   leptospermum manuka honey (MEDIHONEY) PSTE paste, Apply 1 Application topically daily., Disp: , Rfl:    Lifitegrast  (XIIDRA ) 5 % SOLN, Place 1 drop into both eyes 2 (two) times daily., Disp: 180 each, Rfl: 4   linaclotide  (LINZESS ) 145 MCG CAPS capsule, Take 1 capsule (145 mcg total) by mouth daily before breakfast., Disp: 30 capsule, Rfl: 6   Magnesium  250 MG TABS, Take 250 mg by mouth daily., Disp: , Rfl:    metFORMIN  (GLUCOPHAGE -XR) 500 MG 24 hr tablet, Take 1 tablet (500 mg total) by mouth 2 (two) times daily for diabetes., Disp: 180 tablet, Rfl: 1   metoprolol  tartrate (LOPRESSOR ) 25 MG tablet, Take  1 tablet (25 mg total) by mouth 2 (two) times daily., Disp: 60 tablet, Rfl: 0   metroNIDAZOLE  (FLAGYL ) 500 MG tablet, Take 1 tablet (500 mg total) by mouth 2 (two) times daily for 14 days., Disp: 28 tablet, Rfl: 0   nitroGLYCERIN  (NITROSTAT ) 0.4 MG SL tablet, Place 1 tablet (0.4 mg total) under the tongue every 5 (five) minutes as needed for chest pain., Disp: 25 tablet, Rfl: 3   pantoprazole  (PROTONIX ) 40 MG tablet, Take 1 tablet (40 mg total) by mouth daily. (Patient not taking: Reported on 01/02/2024), Disp: 30 tablet, Rfl: 0   potassium chloride  SA (KLOR-CON  M) 20 MEQ tablet, Take 1 tablet (20 mEq total) by mouth daily for 3 days., Disp: 3 tablet, Rfl: 0   sitaGLIPtin  (JANUVIA ) 100 MG tablet, Take 1 tablet (100 mg total) by mouth daily. for diabetes.  (Patient not taking: Reported on 01/02/2024), Disp: 90 tablet, Rfl: 1   tobramycin  (TOBREX ) 0.3 % ophthalmic solution, Place 1 drop into both eyes 4 (four) times daily beginning 1 day before procedure, the day of procedure, and the day after procedure. Continue as directed., Disp: 5 mL, Rfl: 5   zinc  gluconate 50 MG tablet, Take 50 mg by mouth daily., Disp: , Rfl:   Social History   Tobacco Use  Smoking Status Former   Current packs/day: 0.00   Types: Cigarettes   Start date: 02/17/1973   Quit date: 02/18/1975   Years since quitting: 48.9  Smokeless Tobacco Never    No Known Allergies Objective:  There were no vitals filed for this visit. There is no height or weight on file to calculate BMI. Constitutional Well developed. Well nourished.  Vascular Dorsalis pedis pulses palpable bilaterally. Posterior tibial pulses palpable bilaterally. Capillary refill normal to all digits.  No cyanosis or clubbing noted. Pedal hair growth normal.  Neurologic Normal speech. Oriented to person, place, and time. Epicritic sensation to light touch grossly present bilaterally.  Dermatologic Nails well groomed and normal in appearance. No open wounds. No skin lesions.  Orthopedic: No further cellulitis noted to the left lower extremity.  Mild edema still noted.  2+ pitting edema.   Radiographs: None Assessment:   1. Venous stasis syndrome   2. Edema of right lower leg due to venous stasis    Plan:  Patient was evaluated and treated and all questions answered.  Left leg cellulitis now resolved with a history of venous stasis edema - All questions and concerns were discussed with the patient in extensive detail at this time I encouraged her to work compression socks as well as continue elevation to help decrease edema.  In the future if she develops more cellulitis she will come and see me right away or go to the emergency room she states understanding.

## 2024-01-31 ENCOUNTER — Ambulatory Visit
Admission: RE | Admit: 2024-01-31 | Discharge: 2024-01-31 | Disposition: A | Discharge status: Home / Self Care | Source: Ambulatory Visit | Attending: Primary Care | Admitting: Primary Care

## 2024-01-31 DIAGNOSIS — E279 Disorder of adrenal gland, unspecified: Secondary | ICD-10-CM | POA: Insufficient documentation

## 2024-01-31 MED ORDER — GADOBUTROL 1 MMOL/ML IV SOLN
10.0000 mL | Freq: Once | INTRAVENOUS | Status: AC | PRN
  Administered 2024-01-31: 10 mL via INTRAVENOUS

## 2024-03-02 ENCOUNTER — Other Ambulatory Visit: Payer: Self-pay | Admitting: Primary Care

## 2024-03-02 ENCOUNTER — Other Ambulatory Visit: Payer: Self-pay

## 2024-03-02 DIAGNOSIS — R000 Tachycardia, unspecified: Secondary | ICD-10-CM

## 2024-03-02 DIAGNOSIS — E785 Hyperlipidemia, unspecified: Secondary | ICD-10-CM

## 2024-03-02 DIAGNOSIS — E1140 Type 2 diabetes mellitus with diabetic neuropathy, unspecified: Secondary | ICD-10-CM

## 2024-03-02 MED ORDER — LANTUS SOLOSTAR 100 UNIT/ML ~~LOC~~ SOPN
20.0000 [IU] | PEN_INJECTOR | Freq: Every evening | SUBCUTANEOUS | 0 refills | Status: DC
  Filled 2024-03-02: qty 15, 75d supply, fill #0

## 2024-03-02 MED FILL — Atorvastatin Calcium Tab 40 MG (Base Equivalent): ORAL | 90 days supply | Qty: 90 | Fill #0 | Status: AC

## 2024-03-02 NOTE — Telephone Encounter (Signed)
 Please call patient:  Received refill request for metoprolol  succinate medication for her HR. This pill is taken once daily. On her list, she has metoprolol  tartrate which is taken 2 times daily. Which version is she actually taking?

## 2024-03-03 ENCOUNTER — Other Ambulatory Visit: Payer: Self-pay

## 2024-03-03 MED FILL — Metoprolol Succinate Tab ER 24HR 25 MG (Tartrate Equiv): ORAL | 90 days supply | Qty: 90 | Fill #0 | Status: AC

## 2024-03-03 NOTE — Telephone Encounter (Signed)
 Patient returned call and stated she is currently taking  Metoprolol  Succinate 25 MG once a day; but she is willing to do what whatever provider is recommending. Patient will be available the remainder of the afternoon if office needs to reach back out to her with additional questions.

## 2024-03-03 NOTE — Telephone Encounter (Signed)
 She can still take the metoprolol  succinate.  I just wanted clarification so that I sent the right medication to her pharmacy.  Refill sent to pharmacy.

## 2024-03-03 NOTE — Telephone Encounter (Signed)
 Unable to reach patient. Left voicemail to return call to our office.

## 2024-03-04 NOTE — Telephone Encounter (Signed)
 LVM advising patient per dpr.

## 2024-03-05 ENCOUNTER — Encounter: Payer: Self-pay | Admitting: Dietician

## 2024-03-05 ENCOUNTER — Encounter: Attending: Primary Care | Admitting: Dietician

## 2024-03-05 ENCOUNTER — Other Ambulatory Visit: Payer: Self-pay

## 2024-03-05 VITALS — Ht 67.0 in | Wt 210.0 lb

## 2024-03-05 DIAGNOSIS — E1165 Type 2 diabetes mellitus with hyperglycemia: Secondary | ICD-10-CM

## 2024-03-05 DIAGNOSIS — E113513 Type 2 diabetes mellitus with proliferative diabetic retinopathy with macular edema, bilateral: Secondary | ICD-10-CM | POA: Insufficient documentation

## 2024-03-05 DIAGNOSIS — Z794 Long term (current) use of insulin: Secondary | ICD-10-CM | POA: Insufficient documentation

## 2024-03-05 DIAGNOSIS — Z713 Dietary counseling and surveillance: Secondary | ICD-10-CM | POA: Diagnosis not present

## 2024-03-05 NOTE — Patient Instructions (Signed)
 Use meal and snack examples to make healthy food choices and control carb intake.  Include a protein food with each meal, even 1/4 cup nuts, a slice of cheese or cup of low fat, low sugar yogurt if not very hungry Eat plenty of low carb veggies, and ideally 2-4 servings of fruit daily.  Continue with regular exercise, great job! Consider testing blood sugar fasting and about 2 hours after a meal, most days.

## 2024-03-05 NOTE — Progress Notes (Signed)
 Diabetes Self-Management Education  Visit Type: First/Initial  Appt. Start Time: 1000 Appt. End Time: 1055  03/05/2024  Alyssa Stone, identified by name and date of birth, is a 68 y.o. female with a diagnosis of Diabetes: Type 2.   ASSESSMENT  Height 5' 7 (1.702 m), weight 210 lb (95.3 kg). Body mass index is 32.89 kg/m.   Diabetes Self-Management Education - 03/05/24 1006       Visit Information   Visit Type First/Initial      Initial Visit   Diabetes Type Type 2    Are you currently following a meal plan? No    Are you taking your medications as prescribed? Yes      Health Coping   How would you rate your overall health? Good      Psychosocial Assessment   Patient Belief/Attitude about Diabetes Motivated to manage diabetes    What is the hardest part about your diabetes right now, causing you the most concern, or is the most worrisome to you about your diabetes?   Making healty food and beverage choices    Self-care barriers None    Patient Concerns Nutrition/Meal planning    Special Needs None    Preferred Learning Style Auditory    Learning Readiness Ready    How often do you need to have someone help you when you read instructions, pamphlets, or other written materials from your doctor or pharmacy? 1 - Never    What is the last grade level you completed in school? 12      Pre-Education Assessment   Patient understands the diabetes disease and treatment process. Needs Instruction    Patient understands incorporating nutritional management into lifestyle. Needs Instruction    Patient undertands incorporating physical activity into lifestyle. Needs Review    Patient understands using medications safely. Comprehends key points    Patient understands monitoring blood glucose, interpreting and using results Comprehends key points    Patient understands prevention, detection, and treatment of acute complications. Needs Review    Patient understands prevention,  detection, and treatment of chronic complications. Needs Instruction    Patient understands how to develop strategies to address psychosocial issues. Needs Instruction    Patient understands how to develop strategies to promote health/change behavior. Comprehends key points      Complications   Last HgB A1C per patient/outside source 8.5 %    How often do you check your blood sugar? 0 times/day (not testing)   no testing since recent hospital stay; plans to resume   Number of hypoglycemic episodes per month 0    Have you had a dilated eye exam in the past 12 months? Yes    Have you had a dental exam in the past 12 months? No    Are you checking your feet? Yes    How many days per week are you checking your feet? 7      Dietary Intake   Breakfast usually small amount -- pop tart sometimes with a scrambled egg    Snack (morning) none    Lunch pkg crackers    Snack (afternoon) none; rarely ice cream    Dinner varies -- healthy choice meal/    Snack (evening) usually none; recently a serving of watermelon    Beverage(s) water; coffee with creamer no sugar in cold weather; rarely sprite      Activity / Exercise   Activity / Exercise Type Light (walking / raking leaves)    How many days  per week do you exercise? 5    How many minutes per day do you exercise? 30    Total minutes per week of exercise 150      Patient Education   Previous Diabetes Education No    Disease Pathophysiology Definition of diabetes, type 1 and 2, and the diagnosis of diabetes    Healthy Eating Role of diet in the treatment of diabetes and the relationship between the three main macronutrients and blood glucose level;Food label reading, portion sizes and measuring food.;Plate Method;Reviewed blood glucose goals for pre and post meals and how to evaluate the patients' food intake on their blood glucose level.;Meal timing in regards to the patients' current diabetes medication.;Meal options for control of blood glucose  level and chronic complications.    Being Active Role of exercise on diabetes management, blood pressure control and cardiac health.    Monitoring Purpose and frequency of SMBG.;Taught/discussed recording of test results and interpretation of SMBG.;Interpreting lab values - A1C, lipid, urine microalbumina.;Identified appropriate SMBG and/or A1C goals.    Acute complications Taught prevention, symptoms, and  treatment of hypoglycemia - the 15 rule.    Chronic complications Relationship between chronic complications and blood glucose control    Diabetes Stress and Support Role of stress on diabetes      Individualized Goals (developed by patient)   Nutrition Follow meal plan discussed    Monitoring  Test blood glucose pre and post meals as discussed      Post-Education Assessment   Patient understands the diabetes disease and treatment process. Needs Review    Patient understands incorporating nutritional management into lifestyle. Comprehends key points    Patient undertands incorporating physical activity into lifestyle. Comprehends key points    Patient understands using medications safely. Comphrehends key points    Patient understands monitoring blood glucose, interpreting and using results Needs Review    Patient understands prevention, detection, and treatment of acute complications. Comprehends key points    Patient understands prevention, detection, and treatment of chronic complications. Needs Review    Patient understands how to develop strategies to address psychosocial issues. Needs Review      Outcomes   Expected Outcomes Demonstrated interest in learning. Expect positive outcomes    Future DMSE PRN   patient declined scheduling follow up visit at this time   Program Status Not Completed          Individualized Plan for Diabetes Self-Management Training:   Learning Objective:  Patient will have a greater understanding of diabetes self-management. Patient education plan is  to attend individual and/or group sessions per assessed needs and concerns.   Plan:   Patient Instructions  Use meal and snack examples to make healthy food choices and control carb intake.  Include a protein food with each meal, even 1/4 cup nuts, a slice of cheese or cup of low fat, low sugar yogurt if not very hungry Eat plenty of low carb veggies, and ideally 2-4 servings of fruit daily.  Continue with regular exercise, great job! Consider testing blood sugar fasting and about 2 hours after a meal, most days.   Expected Outcomes:  Demonstrated interest in learning. Expect positive outcomes  Education material provided: ADA - How to Thrive: A Guide for Your Journey with Diabetes; General meal planning guidelines for Diabetes; plate planner sheet with food lists; sample menus, snacking handout; blood sugar record  If problems or questions, patient to contact team via:  Phone and patient portal  Future DSME appointment:  PRN (patient declined scheduling follow up visit at this time)

## 2024-03-06 ENCOUNTER — Encounter: Payer: PPO | Admitting: Primary Care

## 2024-03-13 ENCOUNTER — Encounter: Payer: Self-pay | Admitting: Primary Care

## 2024-03-13 ENCOUNTER — Ambulatory Visit: Admitting: Primary Care

## 2024-03-13 ENCOUNTER — Ambulatory Visit: Payer: Self-pay | Admitting: Primary Care

## 2024-03-13 VITALS — BP 126/68 | HR 74 | Temp 97.3°F | Ht 67.0 in | Wt 209.0 lb

## 2024-03-13 DIAGNOSIS — E785 Hyperlipidemia, unspecified: Secondary | ICD-10-CM | POA: Diagnosis not present

## 2024-03-13 DIAGNOSIS — Z0000 Encounter for general adult medical examination without abnormal findings: Secondary | ICD-10-CM | POA: Diagnosis not present

## 2024-03-13 DIAGNOSIS — E113513 Type 2 diabetes mellitus with proliferative diabetic retinopathy with macular edema, bilateral: Secondary | ICD-10-CM | POA: Diagnosis not present

## 2024-03-13 DIAGNOSIS — Z794 Long term (current) use of insulin: Secondary | ICD-10-CM | POA: Diagnosis not present

## 2024-03-13 DIAGNOSIS — E2839 Other primary ovarian failure: Secondary | ICD-10-CM | POA: Diagnosis not present

## 2024-03-13 DIAGNOSIS — K862 Cyst of pancreas: Secondary | ICD-10-CM

## 2024-03-13 DIAGNOSIS — I2510 Atherosclerotic heart disease of native coronary artery without angina pectoris: Secondary | ICD-10-CM

## 2024-03-13 DIAGNOSIS — E279 Disorder of adrenal gland, unspecified: Secondary | ICD-10-CM | POA: Diagnosis not present

## 2024-03-13 DIAGNOSIS — R000 Tachycardia, unspecified: Secondary | ICD-10-CM

## 2024-03-13 LAB — BASIC METABOLIC PANEL WITH GFR
BUN: 16 mg/dL (ref 6–23)
CO2: 32 meq/L (ref 19–32)
Calcium: 9.6 mg/dL (ref 8.4–10.5)
Chloride: 101 meq/L (ref 96–112)
Creatinine, Ser: 0.52 mg/dL (ref 0.40–1.20)
GFR: 94.74 mL/min (ref >60.00)
Glucose, Bld: 144 mg/dL — ABNORMAL HIGH (ref 70–99)
Potassium: 4.6 meq/L (ref 3.5–5.1)
Sodium: 139 meq/L (ref 135–145)

## 2024-03-13 LAB — HEMOGLOBIN A1C: Hgb A1c MFr Bld: 9.7 % — ABNORMAL HIGH (ref 4.6–6.5)

## 2024-03-13 LAB — MICROALBUMIN / CREATININE URINE RATIO
Creatinine,U: 135.5 mg/dL
Microalb Creat Ratio: 225.4 mg/g — ABNORMAL HIGH (ref 0.0–30.0)
Microalb, Ur: 30.6 mg/dL — ABNORMAL HIGH (ref 0.0–1.9)

## 2024-03-13 LAB — LIPID PANEL
Cholesterol: 114 mg/dL (ref 0–200)
HDL: 33.4 mg/dL — ABNORMAL LOW (ref >39.00)
LDL Cholesterol: 54 mg/dL (ref 0–99)
NonHDL: 80.88
Total CHOL/HDL Ratio: 3
Triglycerides: 134 mg/dL (ref 0.0–149.0)
VLDL: 26.8 mg/dL (ref 0.0–40.0)

## 2024-03-13 NOTE — Assessment & Plan Note (Signed)
 Repeat lipid panel pending. Continue atorvastatin 40 mg daily.

## 2024-03-13 NOTE — Progress Notes (Signed)
 Subjective:    Patient ID: Alyssa Stone, female    DOB: [birth year], 68 y.o.   MRN: [id number]  HPI  Alyssa Stone is a very pleasant 68 y.o. female who presents today for complete physical and follow up of chronic conditions.  Immunizations:  -Shingles: Completed Shingrix series -Pneumonia: Completed Prevnar 13 2021, Pneumovax 23 2022  Diet: Fair diet.  Exercise: No regular exercise.  Eye exam: Completes annually  Dental exam: Completed years ago   Mammogram: Completed in June 2025 Bone Density Scan: Completed in December 2023  Colonoscopy: Completed in 2025, due 2030  BP Readings from Last 3 Encounters:  03/13/24 126/68  01/02/24 136/74  12/31/23 (!) 161/67         Review of Systems  Constitutional:  Negative for unexpected weight change.  HENT:  Negative for rhinorrhea.   Respiratory:  Negative for cough and shortness of breath.   Cardiovascular:  Negative for chest pain.  Gastrointestinal:  Negative for constipation and diarrhea.  Genitourinary:  Negative for difficulty urinating.  Musculoskeletal:  Negative for arthralgias and myalgias.  Skin:  Negative for rash.  Allergic/Immunologic: Negative for environmental allergies.  Neurological:  Negative for dizziness and headaches.  Psychiatric/Behavioral:  The patient is not nervous/anxious.          Past Medical History:  Diagnosis Date   Allergy    Anemia    CAD (coronary artery disease)    s/p DES to o-pLAD in 09/2020 // dLAD 75 (small, med Rx)   Cataract    Cellulitis of left lower extremity 12/26/2023   Chicken pox    Depression    Diabetes mellitus    Fainting episodes    Gastroesophageal reflux disease    Hyperlipidemia    controlled om meds    Septic shock (HCC) 12/24/2023   Sinus tachycardia     Social History   Socioeconomic History   Marital status: Divorced    Spouse name: Not on file   Number of children: 3   Years of education: Not on file   Highest education level:  12th grade  Occupational History   Not on file  Tobacco Use   Smoking status: Former    Current packs/day: 0.00    Types: Cigarettes    Start date: 02/17/1973    Quit date: 02/18/1975    Years since quitting: 49.0   Smokeless tobacco: Never  Vaping Use   Vaping status: Never Used  Substance and Sexual Activity   Alcohol use: Not Currently   Drug use: Never   Sexual activity: Not Currently    Birth control/protection: None  Other Topics Concern   Not on file  Social History Narrative   Not on file   Social Drivers of Health   Financial Resource Strain: Low Risk  (09/03/2023)   Overall Financial Resource Strain (CARDIA)    Difficulty of Paying Living Expenses: Not hard at all  Food Insecurity: No Food Insecurity (12/26/2023)   Hunger Vital Sign    Worried About Running Out of Food in the Last Year: Never true    Ran Out of Food in the Last Year: Never true  Transportation Needs: No Transportation Needs (12/26/2023)   PRAPARE - Administrator, Civil Service (Medical): No    Lack of Transportation (Non-Medical): No  Physical Activity: Insufficiently Active (09/03/2023)   Exercise Vital Sign    Days of Exercise per Week: 2 days    Minutes of Exercise per Session: 20 min  Stress: No Stress Concern Present (09/03/2023)   [employer] of Occupational Health - Occupational Stress Questionnaire    Feeling of Stress : Not at all  Social Connections: Socially Isolated (12/26/2023)   Social Connection and Isolation Panel    Frequency of Communication with Friends and Family: Once a week    Frequency of Social Gatherings with Friends and Family: Once a week    Attends Religious Services: Never    Database administrator or Organizations: No    Attends Banker Meetings: Never    Marital Status: Divorced  Catering manager Violence: Not At Risk (12/26/2023)   Humiliation, Afraid, Rape, and Kick questionnaire    Fear of Current or Ex-Partner: No    Emotionally Abused:  No    Physically Abused: No    Sexually Abused: No    Past Surgical History:  Procedure Laterality Date   ANAL FISSURE REPAIR     CATARACT EXTRACTION Bilateral 08/20/2022   CESAREAN SECTION     CORONARY IMAGING/OCT N/A 10/14/2020   Procedure: INTRAVASCULAR IMAGING/OCT;  Surgeon: Dann Candyce RAMAN, MD;  Location: [facility] INVASIVE CV LAB;  Service: Cardiovascular;  Laterality: N/A;   CORONARY PRESSURE/FFR STUDY N/A 10/14/2020   Procedure: INTRAVASCULAR PRESSURE WIRE/FFR STUDY;  Surgeon: Dann Candyce RAMAN, MD;  Location: [facility] INVASIVE CV LAB;  Service: Cardiovascular;  Laterality: N/A;   CORONARY STENT INTERVENTION N/A 10/14/2020   Procedure: CORONARY STENT INTERVENTION;  Surgeon: Dann Candyce RAMAN, MD;  Location: [facility] INVASIVE CV LAB;  Service: Cardiovascular;  Laterality: N/A;   EYE SURGERY     LEFT HEART CATH AND CORONARY ANGIOGRAPHY N/A 10/14/2020   Procedure: LEFT HEART CATH AND CORONARY ANGIOGRAPHY;  Surgeon: Dann Candyce RAMAN, MD;  Location: [facility] INVASIVE CV LAB;  Service: Cardiovascular;  Laterality: N/A;   TONSILLECTOMY Bilateral    TUBAL LIGATION      Family History  Problem Relation Age of Onset   Cancer Mother    Diabetes Mother    Miscarriages / [place] Mother    Hypertension Father    Heart attack Father    Cancer Sister    Diabetes Sister    Diabetes Brother    Heart attack Brother    Kidney disease Brother    Hyperlipidemia Brother    Colon cancer Neg Hx    Colon polyps Neg Hx    Esophageal cancer Neg Hx    Rectal cancer Neg Hx    Stomach cancer Neg Hx    Inflammatory bowel disease Neg Hx    Liver disease Neg Hx    Pancreatic cancer Neg Hx     No Known Allergies  Current Outpatient Medications on File Prior to Visit  Medication Sig Dispense Refill   Ascorbic Acid  (VITAMIN C ) 1000 MG tablet Take 1,000 mg by mouth daily.     atorvastatin  (LIPITOR) 40 MG tablet Take 1 tablet (40 mg total) by mouth every evening for cholestrol. 90 tablet 0    clopidogrel  (PLAVIX ) 75 MG tablet Take 1 tablet (75 mg total) by mouth daily. 90 tablet 3   Continuous Glucose Sensor (FREESTYLE LIBRE 3 PLUS SENSOR) MISC Use to check blood sugar continuously. Change sensor every 15 days. 6 each 1   Cyanocobalamin  (VITAMIN B-12) 5000 MCG SUBL Place 5,000 mcg under the tongue daily.     glucose blood (ONETOUCH VERIO) test strip use to test blood sugar up to 4 (four) times daily. 300 strip 3   insulin  glargine (LANTUS  SOLOSTAR) 100 UNIT/ML Solostar Pen Inject  20 Units into the skin every evening. for diabetes. 15 mL 0   Insulin  Pen Needle (TECHLITE PLUS PEN NEEDLES) 32G X 4 MM MISC Use once daily with insulin . 100 each 3   Lancets (ONETOUCH DELICA PLUS LANCET33G) MISC USE TO TEST BLOOD SUGAR UP TO 4 TIMES A DAY 300 each 5   Lifitegrast  (XIIDRA ) 5 % SOLN Place 1 drop into both eyes 2 (two) times daily. 180 each 4   Magnesium  250 MG TABS Take 250 mg by mouth daily.     metFORMIN  (GLUCOPHAGE -XR) 500 MG 24 hr tablet Take 1 tablet (500 mg total) by mouth 2 (two) times daily for diabetes. 180 tablet 1   metoprolol  succinate (TOPROL -XL) 25 MG 24 hr tablet Take 1 tablet (25 mg total) by mouth daily for heart rate 90 tablet 0   nitroGLYCERIN  (NITROSTAT ) 0.4 MG SL tablet Place 1 tablet (0.4 mg total) under the tongue every 5 (five) minutes as needed for chest pain. 25 tablet 3   sitaGLIPtin  (JANUVIA ) 100 MG tablet Take 1 tablet (100 mg total) by mouth daily. for diabetes. 90 tablet 1   tobramycin  (TOBREX ) 0.3 % ophthalmic solution Place 1 drop into both eyes 4 (four) times daily beginning 1 day before procedure, the day of procedure, and the day after procedure. Continue as directed. 5 mL 5   zinc  gluconate 50 MG tablet Take 50 mg by mouth daily.     No current facility-administered medications on file prior to visit.    BP 126/68   Pulse 74   Temp (!) 97.3 F (36.3 C) (Temporal)   Ht 5' 7 (1.702 m)   Wt 209 lb (94.8 kg)   SpO2 97%   BMI 32.73 kg/m  Objective:    Physical Exam HENT:     Right Ear: Tympanic membrane and ear canal normal.     Left Ear: Tympanic membrane and ear canal normal.  Eyes:     Pupils: Pupils are equal, round, and reactive to light.  Cardiovascular:     Rate and Rhythm: Normal rate and regular rhythm.  Pulmonary:     Effort: Pulmonary effort is normal.     Breath sounds: Normal breath sounds.  Abdominal:     General: Bowel sounds are normal.     Palpations: Abdomen is soft.     Tenderness: There is no abdominal tenderness.  Musculoskeletal:        General: Normal range of motion.     Cervical back: Neck supple.  Skin:    General: Skin is warm and dry.  Neurological:     Mental Status: She is alert and oriented to person, place, and time.     Cranial Nerves: No cranial nerve deficit.     Deep Tendon Reflexes:     Reflex Scores:      Patellar reflexes are 2+ on the right side and 2+ on the left side. Psychiatric:        Mood and Affect: Mood normal.           Assessment & Plan:  Healthcare maintenance Assessment & Plan: Immunizations UTD.  Mammogram UTD Bone density scan due in December 2025, orders placed. Colonoscopy UTD, due 2030  Discussed the importance of a healthy diet and regular exercise in order for weight loss, and to reduce the risk of further co-morbidity.  Exam stable. Labs pending.  Follow up in 1 year for repeat physical.    Estrogen deficiency -     DG Bone Density;  Future  Type 2 diabetes mellitus with both eyes affected by proliferative retinopathy and macular edema, with long-term current use of insulin  (HCC) Assessment & Plan: She is not checking glucose levels, no good reason.   Discussed to start checking glucose levels, she has CGM.  Continue Lantus  20 units daily, Januvia  100 mg daily, metformin  ER 500 mg BID.  Urine microalbumin pending.  Follow-up in 3 to 6 months based on A1c result.  Orders: -     Hemoglobin A1c -     Basic metabolic panel with GFR -      Microalbumin / creatinine urine ratio  Lesion of adrenal gland (HCC) Assessment & Plan: Determined to be benign.   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 Repeat MRI abdomen in June 2026   Dyslipidemia Assessment & Plan: Repeat lipid panel pending.  Continue atorvastatin  40 mg daily.  Orders: -     Lipid panel -     Basic metabolic panel with GFR  Sinus tachycardia Assessment & Plan: Controlled.  Continue metoprolol  succinate 25 mg daily.   Coronary artery disease involving native coronary artery of native heart without angina pectoris Assessment & Plan: Asymptomatic.  Continue clopidogrel  75 mg daily, lipid control, diabetes control.         Tahj Njoku K Kinesha Auten, NP

## 2024-03-13 NOTE — Assessment & Plan Note (Signed)
 Determined to be benign.

## 2024-03-13 NOTE — Patient Instructions (Signed)
 Stop by the lab prior to leaving today. I will notify you of your results once received.   Resume using your continuous glucose monitor to check blood sugar levels.  Please schedule a follow up visit for 6 months for a diabetes check.  It was a pleasure to see you today!

## 2024-03-13 NOTE — Assessment & Plan Note (Signed)
Controlled.  Continue metoprolol succinate 25 mg daily. 

## 2024-03-13 NOTE — Assessment & Plan Note (Signed)
 Incidental finding on MRI abdomen from 2025. Repeat MRI abdomen in June 2026

## 2024-03-13 NOTE — Assessment & Plan Note (Signed)
 She is not checking glucose levels, no good reason.   Discussed to start checking glucose levels, she has CGM.  Continue Lantus  20 units daily, Januvia  100 mg daily, metformin  ER 500 mg BID.  Urine microalbumin pending.  Follow-up in 3 to 6 months based on A1c result.

## 2024-03-13 NOTE — Assessment & Plan Note (Signed)
 Immunizations UTD.  Mammogram UTD Bone density scan due in December 2025, orders placed. Colonoscopy UTD, due 2030  Discussed the importance of a healthy diet and regular exercise in order for weight loss, and to reduce the risk of further co-morbidity.  Exam stable. Labs pending.  Follow up in 1 year for repeat physical.

## 2024-03-13 NOTE — Assessment & Plan Note (Signed)
 Asymptomatic.  Continue clopidogrel  75 mg daily, lipid control, diabetes control.

## 2024-04-15 ENCOUNTER — Other Ambulatory Visit: Payer: Self-pay | Admitting: Primary Care

## 2024-04-15 ENCOUNTER — Other Ambulatory Visit: Payer: Self-pay

## 2024-04-15 DIAGNOSIS — E1140 Type 2 diabetes mellitus with diabetic neuropathy, unspecified: Secondary | ICD-10-CM

## 2024-04-15 MED ORDER — SITAGLIPTIN PHOSPHATE 100 MG PO TABS
100.0000 mg | ORAL_TABLET | Freq: Every day | ORAL | 0 refills | Status: DC
  Filled 2024-04-15: qty 90, 90d supply, fill #0

## 2024-04-15 NOTE — Telephone Encounter (Signed)
 Patient is due for diabetes follow up in early November, this will be required prior to any further refills.  Please schedule, thank you!

## 2024-04-16 ENCOUNTER — Other Ambulatory Visit: Payer: Self-pay

## 2024-04-16 MED ORDER — LIFITEGRAST 5 % OP SOLN
1.0000 [drp] | Freq: Two times a day (BID) | OPHTHALMIC | 4 refills | Status: AC
  Filled 2024-04-16: qty 180, 90d supply, fill #0

## 2024-04-16 NOTE — Telephone Encounter (Signed)
 Needs diabetes appointment in early November, thanks!

## 2024-04-21 ENCOUNTER — Other Ambulatory Visit: Payer: Self-pay

## 2024-04-21 ENCOUNTER — Telehealth: Payer: Self-pay

## 2024-04-21 DIAGNOSIS — A048 Other specified bacterial intestinal infections: Secondary | ICD-10-CM

## 2024-04-21 NOTE — Telephone Encounter (Signed)
-----   Message from Nurse Mikah Rottinghaus P sent at 03/31/2024  8:29 AM EDT -----  ----- Message ----- From: Anitra Odetta CROME, RN Sent: 03/31/2024  12:00 AM EDT To: Odetta CROME Anitra, RN  H. pylori [employer] testing in 2 to 3 months.

## 2024-04-21 NOTE — Telephone Encounter (Signed)
 Left message on machine to call back   [employer] has been left at the front desk on the 2 nd floor.

## 2024-04-22 NOTE — Telephone Encounter (Signed)
 Left message on machine to call back

## 2024-04-22 NOTE — Telephone Encounter (Signed)
 Patient returned call, requesting a call back. Please advise.

## 2024-04-23 NOTE — Telephone Encounter (Signed)
 The patient has been notified of this information and all questions answered.

## 2024-04-30 ENCOUNTER — Other Ambulatory Visit: Payer: Self-pay

## 2024-04-30 MED ORDER — PREDNISOLONE ACETATE 1 % OP SUSP
1.0000 [drp] | Freq: Four times a day (QID) | OPHTHALMIC | 0 refills | Status: DC
  Filled 2024-04-30: qty 5, 25d supply, fill #0

## 2024-05-06 ENCOUNTER — Other Ambulatory Visit: Payer: Self-pay | Admitting: Primary Care

## 2024-05-06 DIAGNOSIS — E1140 Type 2 diabetes mellitus with diabetic neuropathy, unspecified: Secondary | ICD-10-CM

## 2024-05-06 MED FILL — "Insulin Pen Needle 32 G X 4 MM (1/6"" or 5/32"")": 100 days supply | Qty: 100 | Fill #3 | Status: AC

## 2024-05-06 MED FILL — Clopidogrel Bisulfate Tab 75 MG (Base Equiv): ORAL | 90 days supply | Qty: 90 | Fill #2 | Status: AC

## 2024-05-07 ENCOUNTER — Other Ambulatory Visit: Payer: Self-pay

## 2024-05-07 ENCOUNTER — Other Ambulatory Visit: Payer: Self-pay | Admitting: Primary Care

## 2024-05-07 DIAGNOSIS — E1140 Type 2 diabetes mellitus with diabetic neuropathy, unspecified: Secondary | ICD-10-CM

## 2024-05-07 MED FILL — Metformin HCl Tab ER 24HR 500 MG: ORAL | 90 days supply | Qty: 180 | Fill #0 | Status: AC

## 2024-05-07 MED FILL — Insulin Glargine Soln Pen-Injector 100 Unit/ML: SUBCUTANEOUS | 60 days supply | Qty: 15 | Fill #0 | Status: AC

## 2024-05-12 ENCOUNTER — Encounter: Payer: Self-pay | Admitting: Gastroenterology

## 2024-05-14 ENCOUNTER — Ambulatory Visit (INDEPENDENT_AMBULATORY_CARE_PROVIDER_SITE_OTHER): Payer: PPO

## 2024-05-14 VITALS — Ht 67.0 in | Wt 209.0 lb

## 2024-05-14 DIAGNOSIS — Z0000 Encounter for general adult medical examination without abnormal findings: Secondary | ICD-10-CM | POA: Diagnosis not present

## 2024-05-14 NOTE — Progress Notes (Signed)
 Subjective:   Alyssa Stone is a 68 y.o. who presents for a Medicare Wellness preventive visit.  As a reminder, Annual Wellness Visits don't include a physical exam, and some assessments may be limited, especially if this visit is performed virtually. We may recommend an in-person follow-up visit with your provider if needed.  Visit Complete: Virtual I connected with  Alyssa Stone on 05/14/24 by a audio enabled telemedicine application and verified that I am speaking with the correct person using two identifiers.  Patient Location: Home  Provider Location: Home Office  I discussed the limitations of evaluation and management by telemedicine. The patient expressed understanding and agreed to proceed.  Vital Signs: Because this visit was a virtual/telehealth visit, some criteria may be missing or patient reported. Any vitals not documented were not able to be obtained and vitals that have been documented are patient reported.  VideoDeclined- This patient declined Librarian, academic. Therefore the visit was completed with audio only.  Persons Participating in Visit: Patient.  AWV Questionnaire: No: Patient Medicare AWV questionnaire was not completed prior to this visit.  Cardiac Risk Factors include: advanced age (>53men, >40 women);diabetes mellitus;dyslipidemia;obesity (BMI >30kg/m2)     Objective:    Today's Vitals   05/14/24 0816  Weight: 209 lb (94.8 kg)  Height: 5' 7 (1.702 m)   Body mass index is 32.73 kg/m.     05/14/2024    8:29 AM 03/05/2024    9:59 AM 12/24/2023    5:52 PM 05/02/2023   10:01 AM 02/07/2022    1:19 PM 10/14/2020   10:44 AM  Advanced Directives  Does Patient Have a Medical Advance Directive? No No No No No No  Would patient like information on creating a medical advance directive?  No - Patient declined No - Patient declined No - Patient declined No - Patient declined No - Patient declined    Current Medications  (verified) Outpatient Encounter Medications as of 05/14/2024  Medication Sig   Ascorbic Acid  (VITAMIN C ) 1000 MG tablet Take 1,000 mg by mouth daily.   atorvastatin  (LIPITOR) 40 MG tablet Take 1 tablet (40 mg total) by mouth every evening for cholestrol.   clopidogrel  (PLAVIX ) 75 MG tablet Take 1 tablet (75 mg total) by mouth daily.   Continuous Glucose Sensor (FREESTYLE LIBRE 3 PLUS SENSOR) MISC Use to check blood sugar continuously. Change sensor every 15 days.   Cyanocobalamin  (VITAMIN B-12) 5000 MCG SUBL Place 5,000 mcg under the tongue daily.   glucose blood (ONETOUCH VERIO) test strip use to test blood sugar up to 4 (four) times daily.   insulin  glargine (LANTUS  SOLOSTAR) 100 UNIT/ML Solostar Pen Inject 25 Units into the skin every evening. for diabetes.   Insulin  Pen Needle (TECHLITE PLUS PEN NEEDLES) 32G X 4 MM MISC Use once daily with insulin .   Lancets (ONETOUCH DELICA PLUS LANCET33G) MISC USE TO TEST BLOOD SUGAR UP TO 4 TIMES A DAY   Lifitegrast  (XIIDRA ) 5 % SOLN Place 1 drop into both eyes 2 (two) times daily.   Magnesium  250 MG TABS Take 250 mg by mouth daily.   metFORMIN  (GLUCOPHAGE -XR) 500 MG 24 hr tablet Take 1 tablet (500 mg total) by mouth 2 (two) times daily for diabetes.   metoprolol  succinate (TOPROL -XL) 25 MG 24 hr tablet Take 1 tablet (25 mg total) by mouth daily for heart rate   nitroGLYCERIN  (NITROSTAT ) 0.4 MG SL tablet Place 1 tablet (0.4 mg total) under the tongue every 5 (five)  minutes as needed for chest pain.   prednisoLONE  acetate (PRED FORTE ) 1 % ophthalmic suspension Instill 1 drop into right eye four times a day   sitaGLIPtin  (JANUVIA ) 100 MG tablet Take 1 tablet (100 mg total) by mouth daily. for diabetes.   tobramycin  (TOBREX ) 0.3 % ophthalmic solution Place 1 drop into both eyes 4 (four) times daily beginning 1 day before procedure, the day of procedure, and the day after procedure. Continue as directed.   zinc  gluconate 50 MG tablet Take 50 mg by mouth daily.    No facility-administered encounter medications on file as of 05/14/2024.    Allergies (verified) Patient has no known allergies.   History: Past Medical History:  Diagnosis Date   Allergy    Anemia    CAD (coronary artery disease)    s/p DES to o-pLAD in 09/2020 // dLAD 75 (small, med Rx)   Cataract    Cellulitis of left lower extremity 12/26/2023   Chicken pox    Depression    Diabetes mellitus    Fainting episodes    Gastroesophageal reflux disease    Hyperlipidemia    controlled om meds    Septic shock (HCC) 12/24/2023   Sinus tachycardia    Past Surgical History:  Procedure Laterality Date   ANAL FISSURE REPAIR     CATARACT EXTRACTION Bilateral 08/20/2022   CESAREAN SECTION     CORONARY IMAGING/OCT N/A 10/14/2020   Procedure: INTRAVASCULAR IMAGING/OCT;  Surgeon: Dann Candyce RAMAN, MD;  Location: [facility] INVASIVE CV LAB;  Service: Cardiovascular;  Laterality: N/A;   CORONARY PRESSURE/FFR STUDY N/A 10/14/2020   Procedure: INTRAVASCULAR PRESSURE WIRE/FFR STUDY;  Surgeon: Dann Candyce RAMAN, MD;  Location: [facility] INVASIVE CV LAB;  Service: Cardiovascular;  Laterality: N/A;   CORONARY STENT INTERVENTION N/A 10/14/2020   Procedure: CORONARY STENT INTERVENTION;  Surgeon: Dann Candyce RAMAN, MD;  Location: [facility] INVASIVE CV LAB;  Service: Cardiovascular;  Laterality: N/A;   EYE SURGERY     LEFT HEART CATH AND CORONARY ANGIOGRAPHY N/A 10/14/2020   Procedure: LEFT HEART CATH AND CORONARY ANGIOGRAPHY;  Surgeon: Dann Candyce RAMAN, MD;  Location: [facility] INVASIVE CV LAB;  Service: Cardiovascular;  Laterality: N/A;   TONSILLECTOMY Bilateral    TUBAL LIGATION     Family History  Problem Relation Age of Onset   Cancer Mother    Diabetes Mother    Miscarriages / [place] Mother    Hypertension Father    Heart attack Father    Cancer Sister    Diabetes Sister    Diabetes Brother    Heart attack Brother    Kidney disease Brother    Hyperlipidemia Brother    Colon cancer Neg Hx     Colon polyps Neg Hx    Esophageal cancer Neg Hx    Rectal cancer Neg Hx    Stomach cancer Neg Hx    Inflammatory bowel disease Neg Hx    Liver disease Neg Hx    Pancreatic cancer Neg Hx    Social History   Socioeconomic History   Marital status: Divorced    Spouse name: Not on file   Number of children: 3   Years of education: Not on file   Highest education level: 12th grade  Occupational History   Not on file  Tobacco Use   Smoking status: Former    Current packs/day: 0.00    Types: Cigarettes    Start date: 02/17/1973    Quit date: 02/18/1975    Years since quitting: 60.2  Smokeless tobacco: Never  Vaping Use   Vaping status: Never Used  Substance and Sexual Activity   Alcohol use: Not Currently   Drug use: Never   Sexual activity: Not Currently    Birth control/protection: None  Other Topics Concern   Not on file  Social History Narrative   Not on file   Social Drivers of Health   Financial Resource Strain: Low Risk  (05/14/2024)   Overall Financial Resource Strain (CARDIA)    Difficulty of Paying Living Expenses: Not hard at all  Food Insecurity: No Food Insecurity (05/14/2024)   Hunger Vital Sign    Worried About Running Out of Food in the Last Year: Never true    Ran Out of Food in the Last Year: Never true  Transportation Needs: No Transportation Needs (05/14/2024)   PRAPARE - Administrator, Civil Service (Medical): No    Lack of Transportation (Non-Medical): No  Physical Activity: Insufficiently Active (05/14/2024)   Exercise Vital Sign    Days of Exercise per Week: 3 days    Minutes of Exercise per Session: 30 min  Stress: No Stress Concern Present (05/14/2024)   [employer] of Occupational Health - Occupational Stress Questionnaire    Feeling of Stress: Not at all  Social Connections: Socially Isolated (05/14/2024)   Social Connection and Isolation Panel    Frequency of Communication with Friends and Family: Twice a week    Frequency  of Social Gatherings with Friends and Family: Never    Attends Religious Services: Never    Database administrator or Organizations: No    Attends Engineer, structural: Never    Marital Status: Divorced    Tobacco Counseling Counseling given: Not Answered    Clinical Intake:  Pre-visit preparation completed: Yes  Pain : No/denies pain     BMI - recorded: 32.73 Nutritional Status: BMI > 30  Obese Nutritional Risks: None Diabetes: Yes CBG done?: Yes (BS 200 this am) CBG resulted in Enter/ Edit results?: No Did pt. bring in CBG monitor from home?: No  Lab Results  Component Value Date   HGBA1C 9.7 (H) 03/13/2024   HGBA1C 8.5 (A) 12/13/2023   HGBA1C 7.8 (A) 09/05/2023     How often do you need to have someone help you when you read instructions, pamphlets, or other written materials from your doctor or pharmacy?: 1 - Never  Interpreter Needed?: No  Comments: lives alone Information entered by :: B.Alvah Gilder,LPN   Activities of Daily Living     05/14/2024    8:30 AM 12/26/2023    1:26 AM  In your present state of health, do you have any difficulty performing the following activities:  Hearing? 0 0  Vision? 0 0  Difficulty concentrating or making decisions? 0 0  Walking or climbing stairs? 0   Dressing or bathing? 0   Doing errands, shopping? 0 0  Preparing Food and eating ? N   Using the Toilet? N   In the past six months, have you accidently leaked urine? N   Do you have problems with loss of bowel control? N   Managing your Medications? N   Managing your Finances? N   Housekeeping or managing your Housekeeping? N     Patient Care Team: Gretta Comer POUR, NP as PCP - General (Internal Medicine) Thukkani, Arun K, MD as PCP - Cardiology (Cardiology) Portia Fireman, OD (Optometry)  I have updated your Care Teams any recent Medical Services you may have  received from other providers in the past year.     Assessment:   This is a routine  wellness examination for Alyssa Stone.  Hearing/Vision screen Hearing Screening - Comments:: Patient denies any hearing difficulties.   Vision Screening - Comments:: Pt says their vision is good without glasses;readers only Dr  Portia   Goals Addressed               This Visit's Progress     COMPLETED: No current goals (pt-stated)        Patient Stated        05/14/24-Stay active       Depression Screen     05/14/2024    8:24 AM 03/13/2024   10:39 AM 03/05/2024   10:00 AM 01/02/2024    9:21 AM 09/05/2023    9:28 AM 05/02/2023    9:56 AM 02/28/2023    9:38 AM  PHQ 2/9 Scores  PHQ - 2 Score 0 0 0 0 0 0 0  PHQ- 9 Score       0    Fall Risk     05/14/2024    8:19 AM 03/13/2024   10:39 AM 03/05/2024    9:59 AM 01/02/2024    9:21 AM 09/05/2023    9:28 AM  Fall Risk   Falls in the past year? 1 1 1 1  0  Comment pt sick and fell  when ill several months ago    Number falls in past yr: 0 0 0 0 0  Injury with Fall? 0 0 0 1 0  Risk for fall due to : No Fall Risks No Fall Risks  History of fall(s) No Fall Risks  Follow up Education provided;Falls prevention discussed Falls evaluation completed  Falls evaluation completed Falls evaluation completed    MEDICARE RISK AT HOME:  Medicare Risk at Home Any stairs in or around the home?: Yes If so, are there any without handrails?: Yes Home free of loose throw rugs in walkways, pet beds, electrical cords, etc?: Yes Adequate lighting in your home to reduce risk of falls?: Yes Life alert?: No Use of a cane, walker or w/c?: No Grab bars in the bathroom?: Yes Shower chair or bench in shower?: Yes (does not use anymore) Elevated toilet seat or a handicapped toilet?: No  TIMED UP AND GO:  Was the test performed?  No  Cognitive Function: 6CIT completed        05/14/2024    8:31 AM 05/02/2023   10:03 AM 02/07/2022    1:19 PM  6CIT Screen  What Year? 0 points 0 points 0 points  What month? 0 points 0 points 0 points  What time? 0  points 0 points 0 points  Count back from 20 0 points 0 points 0 points  Months in reverse 0 points 0 points 0 points  Repeat phrase 0 points 0 points 0 points  Total Score 0 points 0 points 0 points    Immunizations Immunization History  Administered Date(s) Administered   Fluad  Quad(high Dose 65+) 05/30/2022   Fluad  Trivalent(High Dose 65+) 07/11/2023   Influenza,inj,Quad PF,6+ Mos 05/21/2019   PFIZER(Purple Top)SARS-COV-2 Vaccination 12/12/2019, 01/02/2020   Pneumococcal Conjugate-13 10/07/2019   Pneumococcal Polysaccharide-23 02/09/2021   Respiratory Syncytial Virus Vaccine ,Recomb Aduvanted(Arexvy ) 04/27/2022   Zoster Recombinant(Shingrix) 09/22/2019, 01/27/2020    Screening Tests Health Maintenance  Topic Date Due   Influenza Vaccine  03/20/2024   FOOT EXAM  09/04/2024   HEMOGLOBIN A1C  09/13/2024   OPHTHALMOLOGY EXAM  01/21/2025   Diabetic kidney evaluation - eGFR measurement  03/13/2025   Diabetic kidney evaluation - Urine ACR  03/13/2025   Medicare Annual Wellness (AWV)  05/14/2025   Mammogram  01/16/2026   Colonoscopy  12/19/2028   Pneumococcal Vaccine: 50+ Years  Completed   DEXA SCAN  Completed   Hepatitis C Screening  Completed   Zoster Vaccines- Shingrix  Completed   HPV VACCINES  Aged Out   Meningococcal B Vaccine  Aged Out   DTaP/Tdap/Td  Discontinued   COVID-19 Vaccine  Discontinued    Health Maintenance Items Addressed: None due at this time. Pt will receive vaccines at their pharmcy  or w/PCP when decided to obtain   Additional Screening:  Vision Screening: Recommended annual ophthalmology exams for early detection of glaucoma and other disorders of the eye. Is the patient up to date with their annual eye exam?  Yes -appt 2 weeks ago Who is the provider or what is the name of the office in which the patient attends annual eye exams? Dr Portia  Dental Screening: Recommended annual dental exams for proper oral hygiene  Community Resource  Referral / Chronic Care Management: CRR required this visit?  No   CCM required this visit?  No   Plan:    I have personally reviewed and noted the following in the patient's chart:   Medical and social history Use of alcohol, tobacco or illicit drugs  Current medications and supplements including opioid prescriptions. Patient is not currently taking opioid prescriptions. Functional ability and status Nutritional status Physical activity Advanced directives List of other physicians Hospitalizations, surgeries, and ER visits in previous 12 months Vitals Screenings to include cognitive, depression, and falls Referrals and appointments  In addition, I have reviewed and discussed with patient certain preventive protocols, quality metrics, and best practice recommendations. A written personalized care plan for preventive services as well as general preventive health recommendations were provided to patient.   Erminio LITTIE Saris, LPN   [id number]   After Visit Summary: (MyChart) Due to this being a telephonic visit, the after visit summary with patients personalized plan was offered to patient via MyChart   Notes: Nothing significant to report at this time.

## 2024-05-14 NOTE — Patient Instructions (Signed)
 [name],  Thank you for taking the time for your Medicare Wellness Visit. I appreciate your continued commitment to your health goals. Please review the care plan we discussed, and feel free to reach out if I can assist you further.  Medicare recommends these wellness visits once per year to help you and your care team stay ahead of potential health issues. These visits are designed to focus on prevention, allowing your provider to concentrate on managing your acute and chronic conditions during your regular appointments.  Please note that Annual Wellness Visits do not include a physical exam. Some assessments may be limited, especially if the visit was conducted virtually. If needed, we may recommend a separate in-person follow-up with your provider.  Ongoing Care Seeing your primary care provider every 3 to 6 months helps us  monitor your health and provide consistent, personalized care.   Referrals If a referral was made during today's visit and you haven't received any updates within two weeks, please contact the referred provider directly to check on the status.  Recommended Screenings:  Health Maintenance  Topic Date Due   Flu Shot  03/20/2024   Complete foot exam   09/04/2024   Hemoglobin A1C  09/13/2024   Eye exam for diabetics  01/21/2025   Yearly kidney function blood test for diabetes  03/13/2025   Yearly kidney health urinalysis for diabetes  03/13/2025   Medicare Annual Wellness Visit  05/14/2025   Breast Cancer Screening  01/16/2026   Colon Cancer Screening  12/19/2028   Pneumococcal Vaccine for age over 49  Completed   DEXA scan (bone density measurement)  Completed   Hepatitis C Screening  Completed   Zoster (Shingles) Vaccine  Completed   HPV Vaccine  Aged Out   Meningitis B Vaccine  Aged Out   DTaP/Tdap/Td vaccine  Discontinued   COVID-19 Vaccine  Discontinued       05/14/2024    8:29 AM  Advanced Directives  Does Patient Have a Medical Advance  Directive? No   Advance Care Planning is important because it: Ensures you receive medical care that aligns with your values, goals, and preferences. Provides guidance to your family and loved ones, reducing the emotional burden of decision-making during critical moments.  Vision: Annual vision screenings are recommended for early detection of glaucoma, cataracts, and diabetic retinopathy. These exams can also reveal signs of chronic conditions such as diabetes and high blood pressure.  Dental: Annual dental screenings help detect early signs of oral cancer, gum disease, and other conditions linked to overall health, including heart disease and diabetes.  Please see the attached documents for additional preventive care recommendations.

## 2024-05-26 ENCOUNTER — Other Ambulatory Visit: Payer: Self-pay

## 2024-05-26 ENCOUNTER — Other Ambulatory Visit: Payer: Self-pay | Admitting: Primary Care

## 2024-05-26 DIAGNOSIS — E785 Hyperlipidemia, unspecified: Secondary | ICD-10-CM

## 2024-05-26 DIAGNOSIS — R000 Tachycardia, unspecified: Secondary | ICD-10-CM

## 2024-05-26 MED ORDER — ATORVASTATIN CALCIUM 40 MG PO TABS
40.0000 mg | ORAL_TABLET | Freq: Every evening | ORAL | 2 refills | Status: AC
  Filled 2024-05-26: qty 90, 90d supply, fill #0
  Filled 2024-08-25: qty 90, 90d supply, fill #1

## 2024-05-26 MED ORDER — METOPROLOL SUCCINATE ER 25 MG PO TB24
25.0000 mg | ORAL_TABLET | Freq: Every day | ORAL | 2 refills | Status: AC
  Filled 2024-05-26: qty 90, 90d supply, fill #0
  Filled 2024-08-25: qty 90, 90d supply, fill #1

## 2024-06-25 ENCOUNTER — Ambulatory Visit: Admitting: Primary Care

## 2024-06-25 ENCOUNTER — Encounter: Payer: Self-pay | Admitting: Primary Care

## 2024-06-25 ENCOUNTER — Other Ambulatory Visit: Payer: Self-pay

## 2024-06-25 VITALS — BP 124/66 | HR 78 | Temp 97.8°F | Ht 67.0 in | Wt 210.0 lb

## 2024-06-25 DIAGNOSIS — E113513 Type 2 diabetes mellitus with proliferative diabetic retinopathy with macular edema, bilateral: Secondary | ICD-10-CM

## 2024-06-25 DIAGNOSIS — Z794 Long term (current) use of insulin: Secondary | ICD-10-CM

## 2024-06-25 LAB — POCT GLYCOSYLATED HEMOGLOBIN (HGB A1C): Hemoglobin A1C: 8.7 % — AB (ref 4.0–5.6)

## 2024-06-25 LAB — MICROALBUMIN / CREATININE URINE RATIO
Creatinine,U: 79.7 mg/dL
Microalb Creat Ratio: 301.5 mg/g — ABNORMAL HIGH (ref 0.0–30.0)
Microalb, Ur: 24 mg/dL — ABNORMAL HIGH (ref 0.0–1.9)

## 2024-06-25 MED ORDER — EMPAGLIFLOZIN 10 MG PO TABS
10.0000 mg | ORAL_TABLET | Freq: Every day | ORAL | 1 refills | Status: AC
  Filled 2024-06-25: qty 90, 90d supply, fill #0
  Filled 2024-09-23: qty 90, 90d supply, fill #1

## 2024-06-25 NOTE — Assessment & Plan Note (Addendum)
 Improved but still above goal with A1c of 8.7.  Discussed options for treatment.   Given positive urine microalbumin last visit, coupled with A1c, we will initiate Jardiance 10 mg daily.  Could titrate to 25 mg Continue Lantus  25 units daily, metformin  ER 500 mg BID, Januvia  100 mg daily.  We discussed potential side effects of Jardiance.  Repeat urine microalbumin pending Follow-up in 3 months

## 2024-06-25 NOTE — Progress Notes (Signed)
 Subjective:    Patient ID: Alyssa Stone, female    DOB: 29-May-1956, 68 y.o.   MRN: [id number]  Ekaterina Denise Arenson is a very pleasant 68 y.o. female with a history of type 2 diabetes, CAD, hyperlipidemia who presents today for follow-up of diabetes.  1) Type 2 Diabetes:  Current medications include: Metformin  ER 500 mg twice daily, Januvia  100 mg daily, Lantus  25 units daily  She is checking her blood glucose 1 times daily and is getting readings of:  AM fasting: 139-190, mostly mid 100s on average  Last A1C: 9.7 in July 2025, 8.7 today Last Eye Exam: Up-to-date Last Foot Exam: Up-to-date Pneumonia Vaccination: 2022 Statin: atorvastatin   Urine microalbumin: Due  She has reduced snacking at bedtime, reducing sweets, increasing water intake. She is walking.   BP Readings from Last 3 Encounters:  06/25/24 124/66  03/13/24 126/68  01/02/24 136/74    Wt Readings from Last 3 Encounters:  06/25/24 210 lb (95.3 kg)  05/14/24 209 lb (94.8 kg)  03/13/24 209 lb (94.8 kg)     Review of Systems  Respiratory:  Negative for shortness of breath.   Cardiovascular:  Negative for chest pain.  Neurological:  Positive for numbness. Negative for dizziness.         Past Medical History:  Diagnosis Date   Allergy    Anemia    CAD (coronary artery disease)    s/p DES to o-pLAD in 09/2020 // dLAD 75 (small, med Rx)   Cataract    Cellulitis of left lower extremity 12/26/2023   Chicken pox    Depression    Diabetes mellitus    Fainting episodes    Gastroesophageal reflux disease    Hyperlipidemia    controlled om meds    Septic shock (HCC) 12/24/2023   Sinus tachycardia     Social History   Socioeconomic History   Marital status: Divorced    Spouse name: Not on file   Number of children: 3   Years of education: Not on file   Highest education level: 12th grade  Occupational History   Not on file  Tobacco Use   Smoking status: Former    Current packs/day: 0.00     Types: Cigarettes    Start date: 02/17/1973    Quit date: 02/18/1975    Years since quitting: 49.3   Smokeless tobacco: Never  Vaping Use   Vaping status: Never Used  Substance and Sexual Activity   Alcohol use: Not Currently   Drug use: Never   Sexual activity: Not Currently    Birth control/protection: None  Other Topics Concern   Not on file  Social History Narrative   Not on file   Social Drivers of Health   Financial Resource Strain: Low Risk  (05/14/2024)   Overall Financial Resource Strain (CARDIA)    Difficulty of Paying Living Expenses: Not hard at all  Food Insecurity: No Food Insecurity (05/14/2024)   Hunger Vital Sign    Worried About Running Out of Food in the Last Year: Never true    Ran Out of Food in the Last Year: Never true  Transportation Needs: No Transportation Needs (05/14/2024)   PRAPARE - Administrator, Civil Service (Medical): No    Lack of Transportation (Non-Medical): No  Physical Activity: Insufficiently Active (05/14/2024)   Exercise Vital Sign    Days of Exercise per Week: 3 days    Minutes of Exercise per Session: 30 min  Stress: No  Stress Concern Present (05/14/2024)   [employer] of Occupational Health - Occupational Stress Questionnaire    Feeling of Stress: Not at all  Social Connections: Socially Isolated (05/14/2024)   Social Connection and Isolation Panel    Frequency of Communication with Friends and Family: Twice a week    Frequency of Social Gatherings with Friends and Family: Never    Attends Religious Services: Never    Database Administrator or Organizations: No    Attends Banker Meetings: Never    Marital Status: Divorced  Catering Manager Violence: Not At Risk (05/14/2024)   Humiliation, Afraid, Rape, and Kick questionnaire    Fear of Current or Ex-Partner: No    Emotionally Abused: No    Physically Abused: No    Sexually Abused: No    Past Surgical History:  Procedure Laterality Date   ANAL  FISSURE REPAIR     CATARACT EXTRACTION Bilateral 08/20/2022   CESAREAN SECTION     CORONARY IMAGING/OCT N/A 10/14/2020   Procedure: INTRAVASCULAR IMAGING/OCT;  Surgeon: Dann Candyce RAMAN, MD;  Location: [facility] INVASIVE CV LAB;  Service: Cardiovascular;  Laterality: N/A;   CORONARY PRESSURE/FFR STUDY N/A 10/14/2020   Procedure: INTRAVASCULAR PRESSURE WIRE/FFR STUDY;  Surgeon: Dann Candyce RAMAN, MD;  Location: [facility] INVASIVE CV LAB;  Service: Cardiovascular;  Laterality: N/A;   CORONARY STENT INTERVENTION N/A 10/14/2020   Procedure: CORONARY STENT INTERVENTION;  Surgeon: Dann Candyce RAMAN, MD;  Location: [facility] INVASIVE CV LAB;  Service: Cardiovascular;  Laterality: N/A;   EYE SURGERY     LEFT HEART CATH AND CORONARY ANGIOGRAPHY N/A 10/14/2020   Procedure: LEFT HEART CATH AND CORONARY ANGIOGRAPHY;  Surgeon: Dann Candyce RAMAN, MD;  Location: [facility] INVASIVE CV LAB;  Service: Cardiovascular;  Laterality: N/A;   TONSILLECTOMY Bilateral    TUBAL LIGATION      Family History  Problem Relation Age of Onset   Cancer Mother    Diabetes Mother    Miscarriages / Stillbirths Mother    Hypertension Father    Heart attack Father    Cancer Sister    Diabetes Sister    Diabetes Brother    Heart attack Brother    Kidney disease Brother    Hyperlipidemia Brother    Colon cancer Neg Hx    Colon polyps Neg Hx    Esophageal cancer Neg Hx    Rectal cancer Neg Hx    Stomach cancer Neg Hx    Inflammatory bowel disease Neg Hx    Liver disease Neg Hx    Pancreatic cancer Neg Hx     No Known Allergies  Current Outpatient Medications on File Prior to Visit  Medication Sig Dispense Refill   Ascorbic Acid  (VITAMIN C ) 1000 MG tablet Take 1,000 mg by mouth daily.     atorvastatin  (LIPITOR) 40 MG tablet Take 1 tablet (40 mg total) by mouth every evening for cholestrol. 90 tablet 2   clopidogrel  (PLAVIX ) 75 MG tablet Take 1 tablet (75 mg total) by mouth daily. 90 tablet 3   Continuous Glucose Sensor  (FREESTYLE LIBRE 3 PLUS SENSOR) MISC Use to check blood sugar continuously. Change sensor every 15 days. 6 each 1   Cyanocobalamin  (VITAMIN B-12) 5000 MCG SUBL Place 5,000 mcg under the tongue daily.     glucose blood (ONETOUCH VERIO) test strip use to test blood sugar up to 4 (four) times daily. 300 strip 3   insulin  glargine (LANTUS  SOLOSTAR) 100 UNIT/ML Solostar Pen Inject 25 Units into the skin  every evening. for diabetes. 30 mL 0   Insulin  Pen Needle (TECHLITE PLUS PEN NEEDLES) 32G X 4 MM MISC Use once daily with insulin . 100 each 3   Lancets (ONETOUCH DELICA PLUS LANCET33G) MISC USE TO TEST BLOOD SUGAR UP TO 4 TIMES A DAY 300 each 5   Lifitegrast  (XIIDRA ) 5 % SOLN Place 1 drop into both eyes 2 (two) times daily. 180 each 4   Magnesium  250 MG TABS Take 250 mg by mouth daily.     metFORMIN  (GLUCOPHAGE -XR) 500 MG 24 hr tablet Take 1 tablet (500 mg total) by mouth 2 (two) times daily for diabetes. 180 tablet 1   metoprolol  succinate (TOPROL -XL) 25 MG 24 hr tablet Take 1 tablet (25 mg total) by mouth daily for heart rate 90 tablet 2   nitroGLYCERIN  (NITROSTAT ) 0.4 MG SL tablet Place 1 tablet (0.4 mg total) under the tongue every 5 (five) minutes as needed for chest pain. 25 tablet 3   prednisoLONE  acetate (PRED FORTE ) 1 % ophthalmic suspension Instill 1 drop into right eye four times a day 5 mL 0   sitaGLIPtin  (JANUVIA ) 100 MG tablet Take 1 tablet (100 mg total) by mouth daily. for diabetes. 90 tablet 0   tobramycin  (TOBREX ) 0.3 % ophthalmic solution Place 1 drop into both eyes 4 (four) times daily beginning 1 day before procedure, the day of procedure, and the day after procedure. Continue as directed. 5 mL 5   zinc  gluconate 50 MG tablet Take 50 mg by mouth daily.     No current facility-administered medications on file prior to visit.    BP 124/66   Pulse 78   Temp 97.8 F (36.6 C) (Oral)   Ht 5' 7 (1.702 m)   Wt 210 lb (95.3 kg)   SpO2 97%   BMI 32.89 kg/m  Objective:   Physical  Exam Cardiovascular:     Rate and Rhythm: Normal rate and regular rhythm.  Pulmonary:     Effort: Pulmonary effort is normal.     Breath sounds: Normal breath sounds.  Musculoskeletal:     Cervical back: Neck supple.  Skin:    General: Skin is warm and dry.  Neurological:     Mental Status: She is alert and oriented to person, place, and time.  Psychiatric:        Mood and Affect: Mood normal.     Physical Exam        Assessment & Plan:  Type 2 diabetes mellitus with both eyes affected by proliferative retinopathy and macular edema, with long-term current use of insulin  (HCC) Assessment & Plan: Improved but still above goal with A1c of 8.7.  Discussed options for treatment.   Given positive urine microalbumin last visit, coupled with A1c, we will initiate Jardiance 10 mg daily.  Could titrate to 25 mg Continue Lantus  25 units daily, metformin  ER 500 mg BID, Januvia  100 mg daily.  We discussed potential side effects of Jardiance.  Repeat urine microalbumin pending Follow-up in 3 months  Orders: -     POCT glycosylated hemoglobin (Hb A1C) -     Microalbumin / creatinine urine ratio -     Empagliflozin; Take 1 tablet (10 mg total) by mouth daily. for diabetes.  Dispense: 90 tablet; Refill: 1    Assessment and Plan Assessment & Plan         Comer MARLA Gaskins, NP    History of Present Illness

## 2024-06-25 NOTE — Patient Instructions (Signed)
 Continue Lantus  25 units daily, metformin  1 pill twice daily, Januvia  100 mg daily.  Start taking Jardiance 10 mg once daily for diabetes.  Please schedule a follow up visit for 3 months.

## 2024-06-26 ENCOUNTER — Ambulatory Visit: Payer: Self-pay | Admitting: Primary Care

## 2024-06-26 MED FILL — Insulin Glargine Soln Pen-Injector 100 Unit/ML: SUBCUTANEOUS | 60 days supply | Qty: 15 | Fill #1 | Status: AC

## 2024-07-20 ENCOUNTER — Other Ambulatory Visit: Payer: Self-pay

## 2024-07-20 ENCOUNTER — Other Ambulatory Visit: Payer: Self-pay | Admitting: Primary Care

## 2024-07-20 DIAGNOSIS — E1140 Type 2 diabetes mellitus with diabetic neuropathy, unspecified: Secondary | ICD-10-CM

## 2024-07-20 MED FILL — Sitagliptin Phosphate Tab 100 MG (Base Equiv): ORAL | 90 days supply | Qty: 90 | Fill #0 | Status: AC

## 2024-08-05 MED FILL — Metformin HCl Tab ER 24HR 500 MG: ORAL | 90 days supply | Qty: 180 | Fill #1 | Status: AC

## 2024-08-05 MED FILL — Clopidogrel Bisulfate Tab 75 MG (Base Equiv): ORAL | 90 days supply | Qty: 90 | Fill #3 | Status: AC

## 2024-08-25 ENCOUNTER — Other Ambulatory Visit: Payer: Self-pay

## 2024-08-25 DIAGNOSIS — E1140 Type 2 diabetes mellitus with diabetic neuropathy, unspecified: Secondary | ICD-10-CM

## 2024-08-25 MED FILL — Sitagliptin Phosphate Tab 100 MG (Base Equiv): ORAL | 90 days supply | Qty: 90 | Fill #1 | Status: CN

## 2024-08-26 ENCOUNTER — Other Ambulatory Visit: Payer: Self-pay

## 2024-08-26 MED ORDER — LANTUS SOLOSTAR 100 UNIT/ML ~~LOC~~ SOPN
25.0000 [IU] | PEN_INJECTOR | Freq: Every evening | SUBCUTANEOUS | 0 refills | Status: AC
  Filled 2024-08-26: qty 15, 60d supply, fill #0

## 2024-08-26 MED ORDER — TECHLITE PEN NEEDLES 32G X 4 MM MISC
3 refills | Status: AC
  Filled 2024-08-26: qty 100, 100d supply, fill #0

## 2024-08-27 ENCOUNTER — Encounter: Payer: Self-pay | Admitting: Internal Medicine

## 2024-09-09 ENCOUNTER — Encounter: Payer: Self-pay | Admitting: *Deleted

## 2024-09-10 ENCOUNTER — Ambulatory Visit: Attending: Internal Medicine | Admitting: Internal Medicine

## 2024-09-10 ENCOUNTER — Encounter: Payer: Self-pay | Admitting: Internal Medicine

## 2024-09-10 VITALS — BP 136/80 | HR 77 | Ht 67.0 in | Wt 211.0 lb

## 2024-09-10 DIAGNOSIS — E1169 Type 2 diabetes mellitus with other specified complication: Secondary | ICD-10-CM | POA: Diagnosis not present

## 2024-09-10 DIAGNOSIS — R809 Proteinuria, unspecified: Secondary | ICD-10-CM

## 2024-09-10 DIAGNOSIS — I2510 Atherosclerotic heart disease of native coronary artery without angina pectoris: Secondary | ICD-10-CM | POA: Diagnosis not present

## 2024-09-10 DIAGNOSIS — E785 Hyperlipidemia, unspecified: Secondary | ICD-10-CM | POA: Diagnosis not present

## 2024-09-10 DIAGNOSIS — R0609 Other forms of dyspnea: Secondary | ICD-10-CM | POA: Diagnosis not present

## 2024-09-10 NOTE — Progress Notes (Signed)
 " Cardiology Office Note:  .   Date:  09/10/2024  ID:  Alyssa Stone, DOB Feb 15, 1956, MRN [id number] PCP: Gretta Comer POUR, NP   [facility] Cardiologist:  Arun K Thukkani, MD    History of Present Illness: .     Discussed the use of AI scribe software for clinical note transcription with the patient, who gave verbal consent to proceed.  History of Present Illness Alyssa Stone is a 69 year old female with coronary artery disease and diabetes who presents with dyspnea on exertion.  Exertional dyspnea - Shortness of breath during exertion, such as climbing stairs, for approximately eight months - Dyspnea resolves shortly after resting - Described as 'getting winded a little, not much' - Symptom is unchanged compared to pre-stent placement - No chest pain, leg swelling, cough, fever, or infection symptoms  Coronary artery disease and prior interventions - History of coronary artery disease with prior percutaneous coronary intervention (PCI) to proximal LAD - CT and CCTA showed calcium  score of zero but non-calcified proximal LAD stenosis - Left heart catheterization revealed 70% stenosis in osteo LAD to proximal LAD post-drug eluting stent (DES) and 75% stenosis in distal LAD, treated medically  Diabetes mellitus - Diabetes mellitus with most recent HbA1c of 8.7% in November 2025 - Currently on insulin  and Jardiance  10 mg, recently started due to positive microalbuminuria - No history of treatment with lisinopril or losartan  Cardiovascular risk factors - History of hyperlipidemia and sinus tachycardia - Blood pressure typically well controlled - No tobacco or alcohol use  Current medications - Plavix  75 mg - Jardiance  10 mg - Insulin  - Metoprolol  succinate 25 mg       Dyspnea on exertion prompting CCTA in 2022 which showed a calcium  score of 0 but noncalcified proximal LAD stenosis prompting cardiac catheterization and eventual PCI to proximal  LAD Sinus tachycardia on metoprolol  Hyperlipidemia  Diabetes     ROS: Remaining review of systems negative  Studies Reviewed: .        Results Labs Total cholesterol (03/13/2024): 114 HDL (03/13/2024): 33 LDL (92/74/7974): 54 Triglycerides (03/13/2024): 134 Hemoglobin A1c (November 2025): 8.7  Radiology CCTA (09/29/2020): Coronary artery calcium  score zero. Noncalcified proximal left anterior descending artery stenosis. Risk Assessment/Calculations:             Physical Exam:   VS:  BP 136/80 (BP Location: Left Arm)   Pulse 77   Ht 5' 7 (1.702 m)   Wt 211 lb (95.7 kg)   SpO2 96%   BMI 33.05 kg/m    Wt Readings from Last 3 Encounters:  09/10/24 211 lb (95.7 kg)  06/25/24 210 lb (95.3 kg)  05/14/24 209 lb (94.8 kg)    GEN: Well nourished, well developed in no acute distress NECK: No JVD; No carotid bruits CARDIAC:  RRR, no murmurs, no rubs, no gallops RESPIRATORY:  Clear to auscultation without rales, wheezing or rhonchi  ABDOMEN: Soft, non-tender, non-distended EXTREMITIES:  No edema; No deformity   ASSESSMENT AND PLAN: .    Assessment and Plan Assessment & Plan Coronary artery disease status post PCI Coronary artery disease with previous PCI to proximal LAD. Experiencing stable dyspnea on exertion. Differential includes stent restenosis or new coronary artery disease. - Ordered echocardiogram to assess cardiac function. - Ordered exercise nuclear stress test to evaluate for potential coronary artery blockages. - Consider heart catheterization if nuclear stress test is abnormal.  Hyperlipidemia Well-controlled with current medication regimen. Recent lipid panel within target range. -  Continue current statin therapy.  Hypertension  Microalbuminemia - Consider an ACE inhibitor/ARB       Informed Consent   Shared Decision Making/Informed Consent The risks [chest pain, shortness of breath, cardiac arrhythmias, dizziness, blood pressure fluctuations,  myocardial infarction, stroke/transient ischemic attack, nausea, vomiting, allergic reaction, radiation exposure, metallic taste sensation and life-threatening complications (estimated to be 1 in 10,000)], benefits (risk stratification, diagnosing coronary artery disease, treatment guidance) and alternatives of a nuclear stress test were discussed in detail with [name] and she agrees to proceed.       Follow up: Pending above workup  Signed, Emeline FORBES Calender, DO  09/10/2024 10:51 AM    [facility] "

## 2024-09-10 NOTE — Patient Instructions (Signed)
 Medication Instructions:  Your physician recommends that you continue on your current medications as directed. Please refer to the Current Medication list given to you today.  *If you need a refill on your cardiac medications before your next appointment, please call your pharmacy*  Testing/Procedures:   You are scheduled for a Myocardial Perfusion Study. Please arrive 15 minutes prior to your appointment time for registration and insurance purposes.  The test will take approximately 3-4 hours to complete; you may bring reading material.  If someone comes with you to your appointment, they will need to remain in the main lobby due to limited space in the testing area.  If you are pregnant or breastfeeding, please notify the nuclear lab prior to your appointment.  HOW TO PREPARE FOR YOUR TEST:  DO NOT eat or drink 3 hours prior to your test, except you may have water DO NOT consume products containing caffeine (regular or decaffeinated) 12 hours prior to your test (ex: coffee, chocolate, sodas, tea.) DO bring a list of your current medications with you.  If not listed below, you may take your medications as normal. DO NOT TAKE _________________ for 24 HOURS prior to the test.  Bring the medication to your appointment as you may be required to take it once the test is complete. DO wear comfortable clothes (no dresses or overalls) and walking shoes, tennis shoes preferred (No heels or open toe shoes are allowed) DO NOT wear cologne, perfume, aftershave, or lotions (deodorant is allowed). If these instructions are not followed, your test will have to be rescheduled.   Please report to [address] [place], [place] [address].  If you have questions or concerns about your appointment, you can call the Nuclear Lab [phone].  If you cannot keep your appointment, please provide 24 hours notification to the Nuclear Lab, to avoid a possible $50 charge to your account.     Your physician has  requested that you have an echocardiogram. Echocardiography is a painless test that uses sound waves to create images of your heart. It provides your doctor with information about the size and shape of your heart and how well your hearts chambers and valves are working. This procedure takes approximately one hour. There are no restrictions for this procedure. Please do NOT wear cologne, perfume, aftershave, or lotions (deodorant is allowed). Please arrive 15 minutes prior to your appointment time.  Please note: We ask at that you not bring children with you during ultrasound (echo/ vascular) testing. Due to room size and safety concerns, children are not allowed in the ultrasound rooms during exams. Our front office staff cannot provide observation of children in our lobby area while testing is being conducted. An adult accompanying a patient to their appointment will only be allowed in the ultrasound room at the discretion of the ultrasound technician under special circumstances. We apologize for any inconvenience.   Follow-Up: Follow up depending on results

## 2024-09-17 ENCOUNTER — Ambulatory Visit: Admitting: Primary Care

## 2024-09-23 ENCOUNTER — Other Ambulatory Visit: Payer: Self-pay

## 2024-09-25 ENCOUNTER — Ambulatory Visit: Admitting: Primary Care

## 2024-09-25 ENCOUNTER — Other Ambulatory Visit: Payer: Self-pay | Admitting: Internal Medicine

## 2024-09-25 DIAGNOSIS — R0609 Other forms of dyspnea: Secondary | ICD-10-CM

## 2024-09-25 DIAGNOSIS — I2510 Atherosclerotic heart disease of native coronary artery without angina pectoris: Secondary | ICD-10-CM

## 2024-10-01 ENCOUNTER — Ambulatory Visit (HOSPITAL_COMMUNITY): Admission: RE | Admit: 2024-10-01 | Attending: Internal Medicine

## 2024-10-15 ENCOUNTER — Ambulatory Visit (HOSPITAL_COMMUNITY)

## 2024-10-22 ENCOUNTER — Ambulatory Visit: Admitting: Primary Care

## 2025-05-20 ENCOUNTER — Ambulatory Visit

## 2025-05-21 ENCOUNTER — Ambulatory Visit
